# Patient Record
Sex: Female | Born: 1990 | Race: White | Hispanic: No | Marital: Married | Smoking: Never smoker
Health system: Southern US, Community
[De-identification: ages and names within clinical notes are randomized; demographics above are authoritative.]

## PROBLEM LIST (undated history)

## (undated) ENCOUNTER — Inpatient Hospital Stay (HOSPITAL_COMMUNITY): Payer: Self-pay

## (undated) DIAGNOSIS — F32A Depression, unspecified: Secondary | ICD-10-CM

## (undated) DIAGNOSIS — Z87442 Personal history of urinary calculi: Secondary | ICD-10-CM

## (undated) DIAGNOSIS — Z9889 Other specified postprocedural states: Secondary | ICD-10-CM

## (undated) DIAGNOSIS — N809 Endometriosis, unspecified: Secondary | ICD-10-CM

## (undated) DIAGNOSIS — F419 Anxiety disorder, unspecified: Secondary | ICD-10-CM

## (undated) DIAGNOSIS — F3130 Bipolar disorder, current episode depressed, mild or moderate severity, unspecified: Secondary | ICD-10-CM

## (undated) DIAGNOSIS — M940 Chondrocostal junction syndrome [Tietze]: Secondary | ICD-10-CM

## (undated) DIAGNOSIS — F329 Major depressive disorder, single episode, unspecified: Secondary | ICD-10-CM

## (undated) DIAGNOSIS — F319 Bipolar disorder, unspecified: Secondary | ICD-10-CM

## (undated) DIAGNOSIS — R112 Nausea with vomiting, unspecified: Secondary | ICD-10-CM

## (undated) HISTORY — PX: APPENDECTOMY: SHX54

## (undated) HISTORY — DX: Bipolar disorder, current episode depressed, mild or moderate severity, unspecified: F31.30

## (undated) HISTORY — PX: CHOLECYSTECTOMY: SHX55

---
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
## 2012-04-22 LAB — OB RESULTS CONSOLE HIV ANTIBODY (ROUTINE TESTING): HIV: NONREACTIVE

## 2012-04-22 LAB — OB RESULTS CONSOLE RPR: RPR: NONREACTIVE

## 2012-05-11 NOTE — L&D Delivery Note (Addendum)
Delivery Note At 12:36 PM a viable and healthy female was delivered via Vaginal, Spontaneous Delivery (Presentation: OP manually rotated to Right Occiput Anterior).  APGAR: 8, 9; weight 7 lbs 4 oz.   Placenta status: Intact, Spontaneous.  Cord: 3 vessels.  Anesthesia: Epidural, Pudendal Episiotomy: None Lacerations: 1st degree;Vaginal at introitus Suture Repair: 3.0 chromic Est. Blood Loss (mL): 300  Mom to postpartum.  Baby to nursery-stable.  Yarelli Decelles D 10/18/2012, 1:38 PM

## 2012-06-10 ENCOUNTER — Encounter (HOSPITAL_COMMUNITY): Payer: Self-pay

## 2012-06-10 ENCOUNTER — Inpatient Hospital Stay (HOSPITAL_COMMUNITY): Payer: BC Managed Care – PPO

## 2012-06-10 ENCOUNTER — Inpatient Hospital Stay (HOSPITAL_COMMUNITY)
Admission: AD | Admit: 2012-06-10 | Discharge: 2012-06-10 | Disposition: A | Discharge status: Home / Self Care | Payer: BC Managed Care – PPO | Source: Ambulatory Visit | Attending: Obstetrics and Gynecology | Admitting: Obstetrics and Gynecology

## 2012-06-10 DIAGNOSIS — O26839 Pregnancy related renal disease, unspecified trimester: Secondary | ICD-10-CM

## 2012-06-10 DIAGNOSIS — M545 Low back pain, unspecified: Secondary | ICD-10-CM | POA: Insufficient documentation

## 2012-06-10 DIAGNOSIS — R1032 Left lower quadrant pain: Secondary | ICD-10-CM | POA: Insufficient documentation

## 2012-06-10 DIAGNOSIS — N200 Calculus of kidney: Secondary | ICD-10-CM | POA: Insufficient documentation

## 2012-06-10 HISTORY — DX: Personal history of urinary calculi: Z87.442

## 2012-06-10 MED ORDER — OXYCODONE-ACETAMINOPHEN 5-325 MG PO TABS
2.0000 | ORAL_TABLET | ORAL | Status: AC
  Administered 2012-06-10: 2 via ORAL
  Filled 2012-06-10: qty 2

## 2012-06-10 MED ORDER — TAMSULOSIN HCL 0.4 MG PO CAPS: 0.4000 mg | ORAL_CAPSULE | Freq: Every day | ORAL | Status: DC

## 2012-06-10 MED ORDER — PROMETHAZINE HCL 25 MG PO TABS
25.0000 mg | ORAL_TABLET | ORAL | Status: AC
  Administered 2012-06-10: 25 mg via ORAL
  Filled 2012-06-10: qty 1

## 2012-06-10 MED ORDER — PROMETHAZINE HCL 25 MG PO TABS: 12.5000 mg | ORAL_TABLET | Freq: Four times a day (QID) | ORAL | Status: DC | PRN

## 2012-06-10 MED ORDER — OXYCODONE-ACETAMINOPHEN 5-325 MG PO TABS: 1.0000 | ORAL_TABLET | ORAL | Status: DC | PRN

## 2012-06-10 NOTE — MAU Note (Signed)
Patient states she has a history of kidney stones. Was seen in the office and sent to MAU for evaluation. States she started having right side abdominal and right flank pain this am that feels like a stone. No blood in urine.

## 2012-06-10 NOTE — MAU Provider Note (Signed)
Chief Complaint:  Flank Pain   First Provider Initiated Contact with Patient 06/10/12 1714      HPI: [name] is a 22 y.o. G2P1001 at [redacted]w[redacted]d who presents to maternity admissions reporting LLQ pain, left flank pain, and left lower back pain described as severe, sharp, and constant starting at 6 am today.  She reports nausea with the pain but no vomiting. She denies cramping, vaginal bleeding, vaginal itching/burning, urinary symptoms, h/a, dizziness, or fever/chills.     Past Medical History: Past Medical History  Diagnosis Date  . No pertinent past medical history   . Anxiety   . Depression   . Angina     "arthritis in her chest"  . Arthritis   . History of kidney stones     Past obstetric history: OB History    Grav Para Term Preterm Abortions TAB SAB Ect Mult Living   2 1 1  0 0 0 0 0 0 1     # Outc Date GA Lbr Len/2nd Wgt Sex Del Anes PTL Lv   1 TRM 7/12 [redacted]w[redacted]d 09:28 / 01:19 6lb13oz(3.09kg) M SVD EPI  Yes   Comments: None   2 CUR               Past Surgical History: Past Surgical History  Procedure Date  . Appendectomy   . Cholecystectomy     Family History: Family History  Problem Relation Age of Onset  . Alcohol abuse Father   . Depression Father   . Mental illness Father   . Drug abuse Father   . Depression Mother   . Birth defects Sister   . Depression Maternal Grandmother   . Heart disease Maternal Grandmother   . Hypertension Maternal Grandmother   . Depression Maternal Grandfather   . Heart disease Maternal Grandfather   . Hypertension Maternal Grandfather   . Depression Paternal Grandmother   . Heart disease Paternal Grandmother   . Hypertension Paternal Grandmother   . Depression Paternal Grandfather   . Heart disease Paternal Grandfather   . Cancer Paternal Grandfather   . Alcohol abuse Paternal Grandfather   . Hypertension Paternal Grandfather     Social History: History  Substance Use Topics  . Smoking status: Never Smoker   .  Smokeless tobacco: Never Used  . Alcohol Use: No    Allergies: No Known Allergies  Meds:  Prescriptions prior to admission  Medication Sig Dispense Refill  . FLUoxetine (PROZAC) 20 MG tablet Take 20 mg by mouth daily.      . Pediatric Multivit-Minerals-C (FLINTSTONES GUMMIES PLUS PO) Take 2 tablets by mouth every morning.      . ferrous sulfate 325 (65 FE) MG tablet Take 1 tablet (325 mg total) by mouth 2 (two) times daily with a meal.  60 tablet  10    ROS: Pertinent findings in history of present illness.  Physical Exam  Blood pressure 130/69, pulse 75, temperature 98.2 F (36.8 C), temperature source Oral, resp. rate 18, height 5' 4.5" (1.638 m), weight 55.248 kg (121 lb 12.8 oz), SpO2 100.00%, unknown if currently breastfeeding. GENERAL: Well-developed, well-nourished female demonstrating moderate distress r/t pain  HEENT: normocephalic HEART: normal rate RESP: normal effort ABDOMEN: Soft, tender in left lower quadrant and left flank, no rebound tenderness or guarding, gravid appropriate for gestational age BACK: CVA tenderness negative, generalized tenderness in left lower back EXTREMITIES: Nontender, no edema NEURO: alert and oriented SPECULUM EXAM: Deferred    FHT: 156 by doppler  Imaging:  US Renal  06/10/2012  *RADIOLOGY REPORT*  Clinical Data: Right flank/back pain, history of kidney stones, [redacted] weeks pregnant.  RENAL/URINARY TRACT ULTRASOUND COMPLETE  Comparison:  None.  Findings:  Right Kidney:  Measures 11.2 cm.  Multiple small stones, the largest measuring 4 mm in the interpolar region.  Moderate hydronephrosis.  Left Kidney:  Measures 10.8 cm.  No mass or hydronephrosis.  Bladder:  Within normal limits.  Bilateral bladder jets were not visualized.  IMPRESSION: Multiple small right renal calculi measuring up to 4 mm.  Moderate right hydronephrosis.  While some of this appearance may be physiologic (related to extrinsic compression), the appearance is worrisome for a  non-visualized obstructing ureteral calculus.   Original Report Authenticated By: Charline Bills, M.D.     Assessment: 1. Pregnancy with nephrolithiasis     Plan: Called Dr Claiborne Billings to discuss assessment and findings Percocet 2 tabs in MAU with Phenergan for nausea Discharge home Percocet 5/235 x20 tabs Phenergan 12.5 mg PO Q6 hours PRN Flomax 0.4 mg daily x7 days Materials given to strain urine F/U in office on Monday Return to MAU as needed    Medication List     As of 06/10/2012  5:30 PM    ASK your doctor about these medications         ferrous sulfate 325 (65 FE) MG tablet   Take 1 tablet (325 mg total) by mouth 2 (two) times daily with a meal.      FLINTSTONES GUMMIES PLUS PO   Take 2 tablets by mouth every morning.      FLUoxetine 20 MG tablet   Commonly known as: PROZAC   Take 20 mg by mouth daily.        Sharen Counter Certified Nurse-Midwife 06/10/2012 5:30 PM

## 2012-07-21 ENCOUNTER — Inpatient Hospital Stay (HOSPITAL_COMMUNITY)
Admission: AD | Admit: 2012-07-21 | Discharge: 2012-07-22 | Disposition: A | Discharge status: Home / Self Care | Payer: BC Managed Care – PPO | Source: Ambulatory Visit | Attending: Obstetrics and Gynecology | Admitting: Obstetrics and Gynecology

## 2012-07-21 ENCOUNTER — Encounter (HOSPITAL_COMMUNITY): Payer: Self-pay | Admitting: *Deleted

## 2012-07-21 DIAGNOSIS — R51 Headache: Secondary | ICD-10-CM | POA: Insufficient documentation

## 2012-07-21 DIAGNOSIS — O212 Late vomiting of pregnancy: Secondary | ICD-10-CM | POA: Insufficient documentation

## 2012-07-21 DIAGNOSIS — R6889 Other general symptoms and signs: Secondary | ICD-10-CM

## 2012-07-21 DIAGNOSIS — R079 Chest pain, unspecified: Secondary | ICD-10-CM | POA: Insufficient documentation

## 2012-07-21 DIAGNOSIS — R05 Cough: Secondary | ICD-10-CM | POA: Insufficient documentation

## 2012-07-21 DIAGNOSIS — R059 Cough, unspecified: Secondary | ICD-10-CM | POA: Insufficient documentation

## 2012-07-21 DIAGNOSIS — J111 Influenza due to unidentified influenza virus with other respiratory manifestations: Secondary | ICD-10-CM | POA: Insufficient documentation

## 2012-07-21 DIAGNOSIS — O99891 Other specified diseases and conditions complicating pregnancy: Secondary | ICD-10-CM | POA: Insufficient documentation

## 2012-07-21 HISTORY — DX: Chondrocostal junction syndrome (tietze): M94.0

## 2012-07-21 LAB — CBC
Hemoglobin: 9.5 g/dL — ABNORMAL LOW (ref 12.0–15.0)
MCH: 27.8 pg (ref 26.0–34.0)
MCHC: 33.9 g/dL (ref 30.0–36.0)
MCV: 81.9 fL (ref 78.0–100.0)
Platelets: 180 10*3/uL (ref 150–400)
RBC: 3.42 MIL/uL — ABNORMAL LOW (ref 3.87–5.11)

## 2012-07-21 LAB — URINALYSIS, ROUTINE W REFLEX MICROSCOPIC
Bilirubin Urine: NEGATIVE
Hgb urine dipstick: NEGATIVE
Ketones, ur: >80 mg/dL — AB
Specific Gravity, Urine: 1.01 (ref 1.005–1.030)
Urobilinogen, UA: 0.2 mg/dL (ref 0.0–1.0)

## 2012-07-21 LAB — URINE MICROSCOPIC-ADD ON

## 2012-07-21 MED ORDER — DIPHENHYDRAMINE HCL 50 MG/ML IJ SOLN
25.0000 mg | Freq: Once | INTRAMUSCULAR | Status: AC
  Administered 2012-07-21: 25 mg via INTRAVENOUS
  Filled 2012-07-21: qty 1

## 2012-07-21 MED ORDER — PROMETHAZINE HCL 25 MG/ML IJ SOLN
25.0000 mg | Freq: Once | INTRAVENOUS | Status: AC
  Administered 2012-07-21: 25 mg via INTRAVENOUS
  Filled 2012-07-21: qty 1

## 2012-07-21 MED ORDER — ACETAMINOPHEN 325 MG PO TABS
650.0000 mg | ORAL_TABLET | Freq: Once | ORAL | Status: AC
  Administered 2012-07-21: 650 mg via ORAL
  Filled 2012-07-21: qty 2

## 2012-07-21 MED ORDER — OSELTAMIVIR PHOSPHATE 75 MG PO CAPS: 75.0000 mg | ORAL_CAPSULE | Freq: Two times a day (BID) | ORAL | Status: DC

## 2012-07-21 MED ORDER — LACTATED RINGERS IV BOLUS (SEPSIS)
1000.0000 mL | Freq: Once | INTRAVENOUS | Status: AC
  Administered 2012-07-21: 1000 mL via INTRAVENOUS

## 2012-07-21 MED ORDER — PROMETHAZINE HCL 25 MG PO TABS: 25.0000 mg | ORAL_TABLET | Freq: Four times a day (QID) | ORAL | Status: DC | PRN

## 2012-07-21 MED ORDER — HYDROXYZINE HCL 25 MG PO TABS
25.0000 mg | ORAL_TABLET | Freq: Once | ORAL | Status: AC
  Administered 2012-07-21: 25 mg via ORAL
  Filled 2012-07-21: qty 1

## 2012-07-21 NOTE — MAU Note (Signed)
Pt states she vomitted twice during the night, has had a lot of nausea today, has eaten a pack of crackers in 24 hours.  "My whole body hurts."  Denies diarrhea.  Also denies bleeding or LOF.

## 2012-07-21 NOTE — MAU Note (Signed)
Fetal monitors off per Lilyan Punt NP

## 2012-07-21 NOTE — MAU Provider Note (Signed)
History     CSN: [id number]  Arrival date and time: 07/21/12 1659   First Provider Initiated Contact with Patient 07/21/12 1811      Chief Complaint  Patient presents with  . Nausea   HPI [name] 22 y.o. [redacted]w[redacted]d  Comes to MAU with body aches, headache, head congestion, cough and pain in her chest.  Began feeling bad on Tuesday night.  Much worse since last night.  Denies any asthma, shortness of breath and smoking.  Was not feeling the baby move earlier today but has felt the baby move since arrival to MAU.  Currently is having nausea and feeling very tired.  OB History   Grav Para Term Preterm Abortions TAB SAB Ect Mult Living   2 1 1  0 0 0 0 0 0 1      Past Medical History  Diagnosis Date  . No pertinent past medical history   . Anxiety   . Depression   . Angina     "arthritis in her chest"  . History of kidney stones   . Costochondritis     Past Surgical History  Procedure Laterality Date  . Appendectomy    . Cholecystectomy      Family History  Problem Relation Age of Onset  . Alcohol abuse Father   . Depression Father   . Mental illness Father   . Drug abuse Father   . Depression Mother   . Birth defects Sister   . Depression Maternal Grandmother   . Heart disease Maternal Grandmother   . Hypertension Maternal Grandmother   . Depression Maternal Grandfather   . Heart disease Maternal Grandfather   . Hypertension Maternal Grandfather   . Depression Paternal Grandmother   . Heart disease Paternal Grandmother   . Hypertension Paternal Grandmother   . Depression Paternal Grandfather   . Heart disease Paternal Grandfather   . Cancer Paternal Grandfather   . Alcohol abuse Paternal Grandfather   . Hypertension Paternal Grandfather     History  Substance Use Topics  . Smoking status: Never Smoker   . Smokeless tobacco: Never Used  . Alcohol Use: No    Allergies: No Known Allergies  Prescriptions prior to admission  Medication Sig Dispense  Refill  . acetaminophen (TYLENOL) 325 MG tablet Take 650 mg by mouth every 6 (six) hours as needed for pain (for headache).      Marland Kitchen FLUoxetine (PROZAC) 20 MG tablet Take 20 mg by mouth daily.      . Pediatric Multivit-Minerals-C (FLINTSTONES GUMMIES PLUS PO) Take 2 tablets by mouth every morning.        Review of Systems  Constitutional: Negative for chills.       Temp of 99 last night.  HENT: Positive for congestion.   Respiratory: Positive for cough. Negative for shortness of breath.   Cardiovascular:       Pain her her chest  Gastrointestinal: Negative for abdominal pain.  Musculoskeletal: Positive for myalgias.  Neurological: Positive for headaches.   Physical Exam   Blood pressure 112/70, pulse 117, temperature 98.3 F (36.8 C), temperature source Oral, resp. rate 20.  Physical Exam  Nursing note and vitals reviewed. Constitutional: She is oriented to person, place, and time. She appears well-developed and well-nourished.  Lying in bed with eyes closed  HENT:  Head: Normocephalic.  Eyes: EOM are normal.  Neck: Neck supple.  Cardiovascular: Normal rate, regular rhythm and normal heart sounds.  Exam reveals no gallop and no friction  rub.   No murmur heard. Respiratory: Effort normal and breath sounds normal. No respiratory distress. She has no wheezes. She has no rales.  GI: Soft. There is no tenderness. There is no rebound and no guarding.  No contractions seen on monitor FHT  Baseline 140 with good variability.  Musculoskeletal: Normal range of motion.  Neurological: She is alert and oriented to person, place, and time.  Skin: Skin is warm and dry.  Psychiatric: She has a normal mood and affect.    MAU Course  Procedures Results for orders placed during the hospital encounter of 07/21/12 (from the past 24 hour(s))  URINALYSIS, ROUTINE W REFLEX MICROSCOPIC     Status: Abnormal   Collection Time    07/21/12  5:25 PM      Result Value Range   Color, Urine YELLOW   YELLOW   APPearance CLEAR  CLEAR   Specific Gravity, Urine 1.010  1.005 - 1.030   pH 6.5  5.0 - 8.0   Glucose, UA NEGATIVE  NEGATIVE mg/dL   Hgb urine dipstick NEGATIVE  NEGATIVE   Bilirubin Urine NEGATIVE  NEGATIVE   Ketones, ur >80 (*) NEGATIVE mg/dL   Protein, ur NEGATIVE  NEGATIVE mg/dL   Urobilinogen, UA 0.2  0.0 - 1.0 mg/dL   Nitrite NEGATIVE  NEGATIVE   Leukocytes, UA TRACE (*) NEGATIVE  URINE MICROSCOPIC-ADD ON     Status: Abnormal   Collection Time    07/21/12  5:25 PM      Result Value Range   Squamous Epithelial / LPF MANY (*) RARE   WBC, UA 7-10  <3 WBC/hpf   Bacteria, UA FEW (*) RARE   Urine-Other RENAL TUBULUAR EPITHELIAL CELLS PRESENT    CBC     Status: Abnormal   Collection Time    07/21/12  6:22 PM      Result Value Range   WBC 11.5 (*) 4.0 - 10.5 K/uL   RBC 3.42 (*) 3.87 - 5.11 MIL/uL   Hemoglobin 9.5 (*) 12.0 - 15.0 g/dL   HCT 16.1 (*) 09.6 - 04.5 %   MCV 81.9  78.0 - 100.0 fL   MCH 27.8  26.0 - 34.0 pg   MCHC 33.9  30.0 - 36.0 g/dL   RDW 40.9  81.1 - 91.4 %   Platelets 180  150 - 400 K/uL    MDM 1820  Consult with Dr. Dareen Piano and reviewed plan of care. Will give LR 1000cc with Phenergan 25 mg as client is feeling washed out and has not been able to eat today. 2100 Care assumed by S. Chase Picket, NP Pt developed tardive dyskinesia after phenergan- Benadryl 25mg  IV and LR 1000cc bolus- pt anxious and still having dyskinesia- Vistaril 25mg  PO given Dr. Malva Limes consulted  Assessment and Plan  URI vs Flu Nausea in pregnancy  Plan IVF given RX tamiflu as symptoms may be due to flu RX Phenergan 25 mg One po q 6 hours as needed for nausea. Drink at least 8 8-oz glasses of water every day. Take Tylenol 325 mg 2 tablets by mouth every 4 hours if needed for pain. May take OTC Sudafed by the package directions for nasal congestion if desired.  BURLESON,TERRI 07/21/2012, 6:19 PM

## 2012-07-21 NOTE — MAU Note (Signed)
Started last night with nausea and vomiting.  Has not thrown up today, has not eaten anything. Just doesn't feel good.  Denies diarrhea.

## 2012-07-22 NOTE — MAU Note (Signed)
Pt experiencing shakiness. Wende Bushy NP notified.

## 2012-07-22 NOTE — MAU Note (Signed)
Pt given benadryl per Wende Bushy NP

## 2012-07-22 NOTE — MAU Note (Signed)
Pt given Vistaril per Wende Bushy NP. Orders to d/c pt home.

## 2012-07-22 NOTE — MAU Note (Signed)
Pt given d/c instructions. Pt will follow up with Dr Dareen Piano in the morning.

## 2012-07-23 LAB — URINE CULTURE
Colony Count: NO GROWTH
Culture: NO GROWTH

## 2012-09-27 LAB — OB RESULTS CONSOLE GC/CHLAMYDIA: Chlamydia: NEGATIVE

## 2012-09-27 LAB — OB RESULTS CONSOLE GBS: GBS: NEGATIVE

## 2012-10-17 ENCOUNTER — Encounter (HOSPITAL_COMMUNITY): Payer: Self-pay | Admitting: *Deleted

## 2012-10-17 ENCOUNTER — Inpatient Hospital Stay (HOSPITAL_COMMUNITY)
Admission: AD | Admit: 2012-10-17 | Discharge: 2012-10-20 | DRG: 373 | Disposition: A | Discharge status: Home / Self Care | Payer: BC Managed Care – PPO | Source: Ambulatory Visit | Attending: Obstetrics and Gynecology | Admitting: Obstetrics and Gynecology

## 2012-10-17 NOTE — MAU Note (Signed)
PT SAYS SHE STARTED   HURTING BAD AT 945PM.    VE IN OFFICE LAST WEEK -   4 CM.    DENIES HSV AND MRSA.  GBS- NEG.

## 2012-10-18 ENCOUNTER — Encounter (HOSPITAL_COMMUNITY): Payer: Self-pay

## 2012-10-18 ENCOUNTER — Encounter (HOSPITAL_COMMUNITY): Payer: Self-pay | Admitting: Anesthesiology

## 2012-10-18 ENCOUNTER — Inpatient Hospital Stay (HOSPITAL_COMMUNITY): Payer: BC Managed Care – PPO | Admitting: Anesthesiology

## 2012-10-18 LAB — CBC
HCT: 31.3 % — ABNORMAL LOW (ref 36.0–46.0)
Hemoglobin: 10 g/dL — ABNORMAL LOW (ref 12.0–15.0)
MCH: 21.6 pg — ABNORMAL LOW (ref 26.0–34.0)
MCHC: 31.9 g/dL (ref 30.0–36.0)
MCV: 67.7 fL — ABNORMAL LOW (ref 78.0–100.0)
Platelets: 257 10*3/uL (ref 150–400)
RBC: 4.62 MIL/uL (ref 3.87–5.11)
RDW: 14.8 % (ref 11.5–15.5)
WBC: 14.5 10*3/uL — ABNORMAL HIGH (ref 4.0–10.5)

## 2012-10-18 LAB — RPR: RPR Ser Ql: NONREACTIVE

## 2012-10-18 MED ORDER — FENTANYL 2.5 MCG/ML BUPIVACAINE 1/10 % EPIDURAL INFUSION (WH - ANES)
14.0000 mL/h | INTRAMUSCULAR | Status: DC | PRN
  Administered 2012-10-18: 14 mL/h via EPIDURAL
  Filled 2012-10-18: qty 125

## 2012-10-18 MED ORDER — PRENATAL MULTIVITAMIN CH
1.0000 | ORAL_TABLET | Freq: Every day | ORAL | Status: DC
  Administered 2012-10-19 – 2012-10-20 (×2): 1 via ORAL
  Filled 2012-10-18 (×2): qty 1

## 2012-10-18 MED ORDER — TETANUS-DIPHTH-ACELL PERTUSSIS 5-2.5-18.5 LF-MCG/0.5 IM SUSP
0.5000 mL | Freq: Once | INTRAMUSCULAR | Status: AC
  Administered 2012-10-18: 0.5 mL via INTRAMUSCULAR

## 2012-10-18 MED ORDER — EPHEDRINE 5 MG/ML INJ
10.0000 mg | INTRAVENOUS | Status: DC | PRN
  Filled 2012-10-18: qty 2
  Filled 2012-10-18: qty 4

## 2012-10-18 MED ORDER — OXYTOCIN 40 UNITS IN LACTATED RINGERS INFUSION - SIMPLE MED
62.5000 mL/h | INTRAVENOUS | Status: DC
  Filled 2012-10-18: qty 1000

## 2012-10-18 MED ORDER — DIBUCAINE 1 % RE OINT: 1.0000 "application " | TOPICAL_OINTMENT | RECTAL | Status: DC | PRN

## 2012-10-18 MED ORDER — ZOLPIDEM TARTRATE 5 MG PO TABS: 5.0000 mg | ORAL_TABLET | Freq: Every evening | ORAL | Status: DC | PRN

## 2012-10-18 MED ORDER — DIPHENHYDRAMINE HCL 25 MG PO CAPS: 25.0000 mg | ORAL_CAPSULE | Freq: Four times a day (QID) | ORAL | Status: DC | PRN

## 2012-10-18 MED ORDER — LIDOCAINE HCL (PF) 1 % IJ SOLN
30.0000 mL | INTRAMUSCULAR | Status: AC | PRN
  Administered 2012-10-18: 30 mL via SUBCUTANEOUS
  Filled 2012-10-18 (×2): qty 30

## 2012-10-18 MED ORDER — ONDANSETRON HCL 4 MG/2ML IJ SOLN: 4.0000 mg | INTRAMUSCULAR | Status: DC | PRN

## 2012-10-18 MED ORDER — PHENYLEPHRINE 40 MCG/ML (10ML) SYRINGE FOR IV PUSH (FOR BLOOD PRESSURE SUPPORT)
80.0000 ug | PREFILLED_SYRINGE | INTRAVENOUS | Status: DC | PRN
  Filled 2012-10-18: qty 2
  Filled 2012-10-18: qty 5

## 2012-10-18 MED ORDER — OXYCODONE-ACETAMINOPHEN 5-325 MG PO TABS: 1.0000 | ORAL_TABLET | ORAL | Status: DC | PRN

## 2012-10-18 MED ORDER — SODIUM BICARBONATE 8.4 % IV SOLN
INTRAVENOUS | Status: DC | PRN
  Administered 2012-10-18: 5 mL via EPIDURAL

## 2012-10-18 MED ORDER — ACETAMINOPHEN 325 MG PO TABS: 650.0000 mg | ORAL_TABLET | ORAL | Status: DC | PRN

## 2012-10-18 MED ORDER — CITRIC ACID-SODIUM CITRATE 334-500 MG/5ML PO SOLN: 30.0000 mL | ORAL | Status: DC | PRN

## 2012-10-18 MED ORDER — IBUPROFEN 600 MG PO TABS
600.0000 mg | ORAL_TABLET | Freq: Four times a day (QID) | ORAL | Status: DC
Start: 2012-10-18 — End: 2012-10-20
  Administered 2012-10-18 – 2012-10-20 (×8): 600 mg via ORAL
  Filled 2012-10-18 (×9): qty 1

## 2012-10-18 MED ORDER — EPHEDRINE 5 MG/ML INJ
10.0000 mg | INTRAVENOUS | Status: DC | PRN
  Filled 2012-10-18: qty 2

## 2012-10-18 MED ORDER — FLEET ENEMA 7-19 GM/118ML RE ENEM: 1.0000 | ENEMA | RECTAL | Status: DC | PRN

## 2012-10-18 MED ORDER — DIPHENHYDRAMINE HCL 50 MG/ML IJ SOLN: 12.5000 mg | INTRAMUSCULAR | Status: DC | PRN

## 2012-10-18 MED ORDER — BENZOCAINE-MENTHOL 20-0.5 % EX AERO: 1.0000 "application " | INHALATION_SPRAY | CUTANEOUS | Status: DC | PRN

## 2012-10-18 MED ORDER — SIMETHICONE 80 MG PO CHEW: 80.0000 mg | CHEWABLE_TABLET | ORAL | Status: DC | PRN

## 2012-10-18 MED ORDER — LACTATED RINGERS IV SOLN
500.0000 mL | INTRAVENOUS | Status: DC | PRN
  Administered 2012-10-18: 300 mL via INTRAVENOUS

## 2012-10-18 MED ORDER — TERBUTALINE SULFATE 1 MG/ML IJ SOLN: 0.2500 mg | Freq: Once | INTRAMUSCULAR | Status: DC | PRN

## 2012-10-18 MED ORDER — OXYCODONE-ACETAMINOPHEN 5-325 MG PO TABS
1.0000 | ORAL_TABLET | ORAL | Status: DC | PRN
  Administered 2012-10-18 – 2012-10-19 (×4): 2 via ORAL
  Administered 2012-10-20: 1 via ORAL
  Filled 2012-10-18: qty 2
  Filled 2012-10-18: qty 1
  Filled 2012-10-18: qty 2
  Filled 2012-10-18: qty 1
  Filled 2012-10-18: qty 2
  Filled 2012-10-18: qty 1

## 2012-10-18 MED ORDER — SENNOSIDES-DOCUSATE SODIUM 8.6-50 MG PO TABS
2.0000 | ORAL_TABLET | Freq: Every day | ORAL | Status: DC
Start: 2012-10-18 — End: 2012-10-20
  Administered 2012-10-18 – 2012-10-19 (×2): 2 via ORAL

## 2012-10-18 MED ORDER — ONDANSETRON HCL 4 MG/2ML IJ SOLN: 4.0000 mg | Freq: Four times a day (QID) | INTRAMUSCULAR | Status: DC | PRN

## 2012-10-18 MED ORDER — PHENYLEPHRINE 40 MCG/ML (10ML) SYRINGE FOR IV PUSH (FOR BLOOD PRESSURE SUPPORT)
80.0000 ug | PREFILLED_SYRINGE | INTRAVENOUS | Status: DC | PRN
  Filled 2012-10-18: qty 2

## 2012-10-18 MED ORDER — IBUPROFEN 600 MG PO TABS
600.0000 mg | ORAL_TABLET | Freq: Four times a day (QID) | ORAL | Status: DC | PRN
  Administered 2012-10-18: 600 mg via ORAL
  Filled 2012-10-18: qty 1

## 2012-10-18 MED ORDER — LANOLIN HYDROUS EX OINT: TOPICAL_OINTMENT | CUTANEOUS | Status: DC | PRN

## 2012-10-18 MED ORDER — ONDANSETRON HCL 4 MG PO TABS: 4.0000 mg | ORAL_TABLET | ORAL | Status: DC | PRN

## 2012-10-18 MED ORDER — OXYTOCIN BOLUS FROM INFUSION
500.0000 mL | INTRAVENOUS | Status: DC
  Administered 2012-10-18: 500 mL via INTRAVENOUS

## 2012-10-18 MED ORDER — OXYTOCIN 40 UNITS IN LACTATED RINGERS INFUSION - SIMPLE MED
1.0000 m[IU]/min | INTRAVENOUS | Status: DC
  Administered 2012-10-18: 2 m[IU]/min via INTRAVENOUS

## 2012-10-18 MED ORDER — LACTATED RINGERS IV SOLN
500.0000 mL | Freq: Once | INTRAVENOUS | Status: AC
  Administered 2012-10-18: 500 mL via INTRAVENOUS

## 2012-10-18 MED ORDER — LACTATED RINGERS IV SOLN
INTRAVENOUS | Status: DC
  Administered 2012-10-18 (×2): via INTRAVENOUS

## 2012-10-18 MED ORDER — WITCH HAZEL-GLYCERIN EX PADS: 1.0000 "application " | MEDICATED_PAD | CUTANEOUS | Status: DC | PRN

## 2012-10-18 NOTE — H&P (Signed)
22 y.o. G2P1001  Estimated Date of Delivery: 10/25/12 admitted at [redacted] weeks gestation in labor.  Prenatal Transfer Tool  Maternal Diabetes: No Genetic Screening: Normal Maternal Ultrasounds/Referrals: Normal Fetal Ultrasounds or other Referrals:  None Maternal Substance Abuse:  No Significant Maternal Medications:  None Significant Maternal Lab Results: None Other Significant Pregnancy Complications:  None  Afebrile, VSS Heart and Lungs: No active disease Abdomen: soft, gravid, EFW AGA. Cervical exam:  6/80  Impression: Labor  Plan:  Admit for delivery

## 2012-10-18 NOTE — Anesthesia Procedure Notes (Signed)

## 2012-10-18 NOTE — Anesthesia Preprocedure Evaluation (Signed)

## 2012-10-19 LAB — CBC
MCHC: 31.1 g/dL (ref 30.0–36.0)
RDW: 14.8 % (ref 11.5–15.5)

## 2012-10-19 NOTE — Anesthesia Postprocedure Evaluation (Signed)
Anesthesia Post Note  Patient: [name]  Procedure(s) Performed: * No procedures listed *  Anesthesia type: Epidural  Patient location: Mother/Baby  Post pain: Pain level controlled  Post assessment: Post-op Vital signs reviewed  Last Vitals:  Filed Vitals:   10/19/12 0604  BP: 121/75  Pulse: 64  Temp: 36.4 C  Resp: 18    Post vital signs: Reviewed  Level of consciousness: awake  Complications: No apparent anesthesia complications

## 2012-10-19 NOTE — Progress Notes (Signed)
PPD#1 Pt and mother are doing well. HGB-8.5 IMP/ stable PLAN/ Routine care.

## 2012-10-20 MED ORDER — ESCITALOPRAM OXALATE 20 MG PO TABS: 20.0000 mg | ORAL_TABLET | Freq: Every day | ORAL | Status: DC

## 2012-10-20 NOTE — Progress Notes (Signed)
Patient is eating, ambulating, voiding.  Pain control is good.  Filed Vitals:   10/18/12 1833 10/19/12 0604 10/19/12 1730 10/20/12 0615  BP: 139/76 121/75 127/70 119/78  Pulse: 79 64 75 69  Temp: 98.5 F (36.9 C) 97.6 F (36.4 C) 97.7 F (36.5 C) 97.5 F (36.4 C)  TempSrc: Oral Oral Axillary Oral  Resp: 18 18 18 19   Height:      Weight:      SpO2:    95%    Fundus firm Perineum without swelling.  Lab Results  Component Value Date   WBC 12.7* 10/19/2012   HGB 8.5* 10/19/2012   HCT 27.3* 10/19/2012   MCV 68.6* 10/19/2012   PLT 181 10/19/2012     O+/RI  A/P Post partum day 2.  Routine care.  Expect d/c today.    Tanay Massiah A

## 2012-10-20 NOTE — Discharge Summary (Signed)
Obstetric Discharge Summary Reason for Admission: onset of labor Prenatal Procedures: none Intrapartum Procedures: spontaneous vaginal delivery Postpartum Procedures: none Complications-Operative and Postpartum: 1st degree perineal laceration Hemoglobin  Date Value Range Status  10/19/2012 8.5* 12.0 - 15.0 g/dL Final     HCT  Date Value Range Status  10/19/2012 27.3* 36.0 - 46.0 % Final    Discharge Diagnoses: Term Pregnancy-delivered  Discharge Information: Date: 10/20/2012 Activity: pelvic rest Diet: routine Medications: Tylenol #3 and Iron Condition: stable Instructions: refer to practice specific booklet Discharge to: home Follow-up Information   Follow up with Mickel Baas, MD In 4 weeks.   Contact information:   [address] [place] [address] [phone]       Newborn Data: Live born female  Birth Weight: 7 lb 3.9 oz (3285 g) APGAR: 8, 9  Home with mother.  Squire Withey A 10/20/2012, 7:53 AM

## 2012-10-20 NOTE — Progress Notes (Signed)

## 2013-12-01 IMAGING — US US RENAL
1 series · 14 of 25 positions shown · non-contrast
Comparison: None.

CLINICAL DATA: Right flank/back pain, history of kidney stones, 20
weeks pregnant.

RENAL/URINARY TRACT ULTRASOUND COMPLETE

[Series 1: us renal · 14 of 54 slices shown]
[im 1/54]
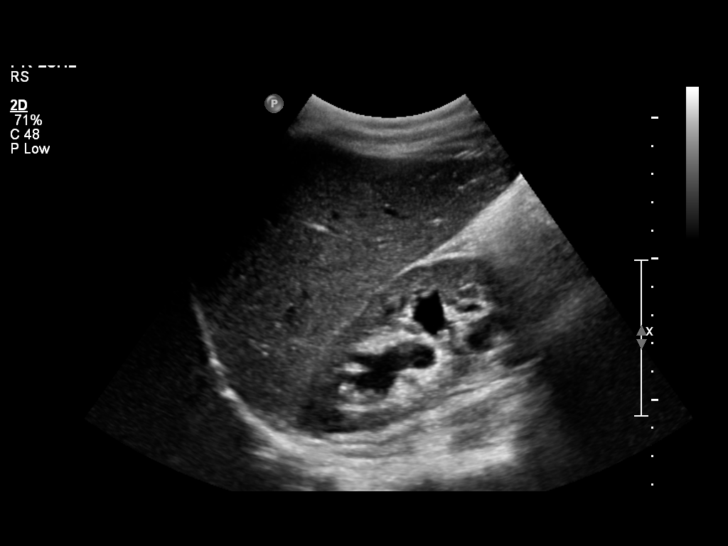
[im 5/54]
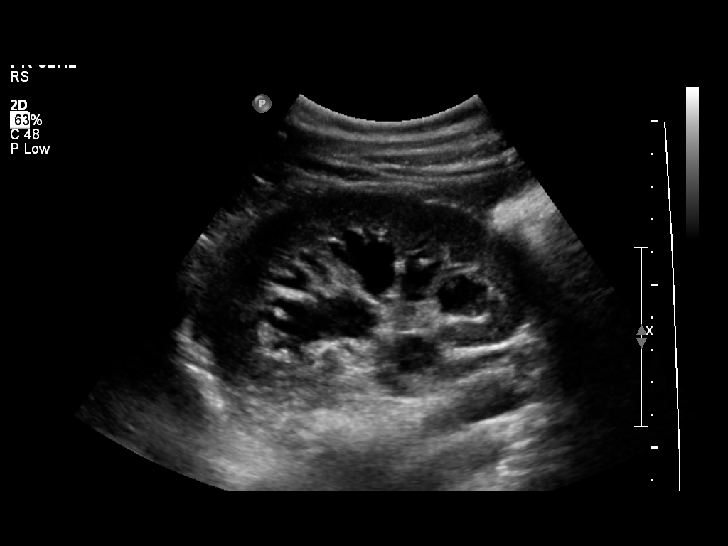
[im 9/54]
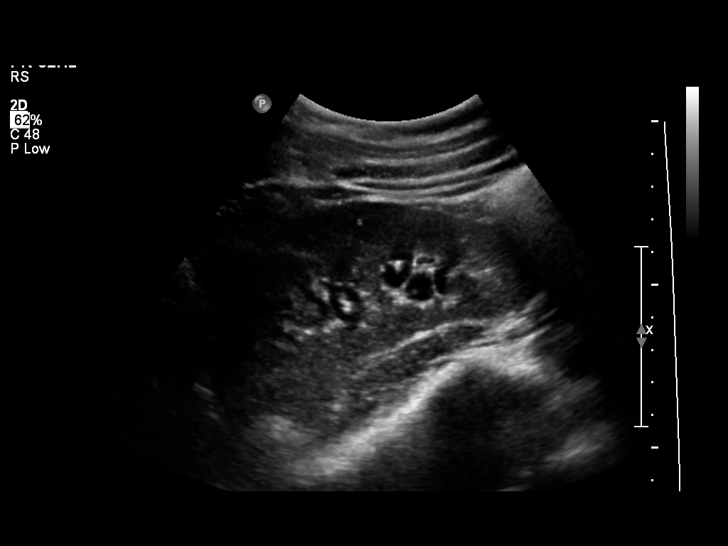
[im 14/54]
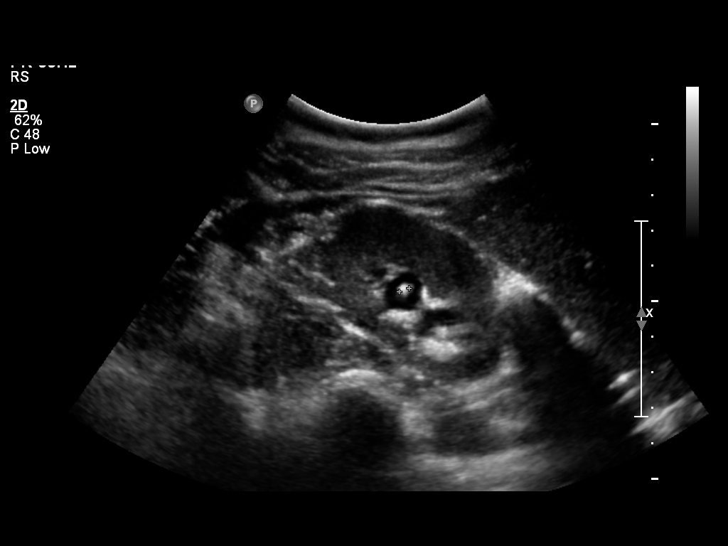
[im 18/54]
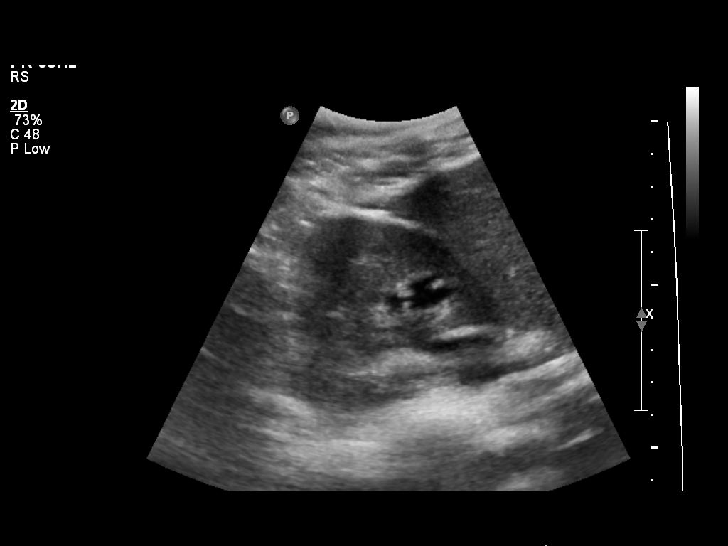
[im 20/54]
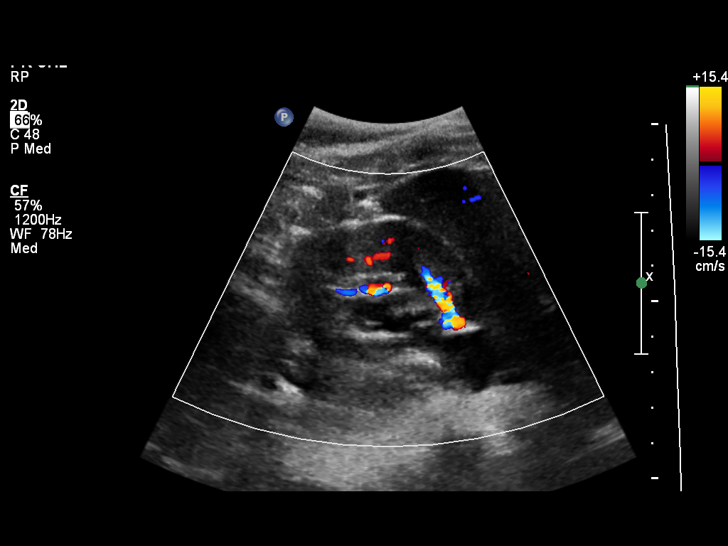
[im 25/54]
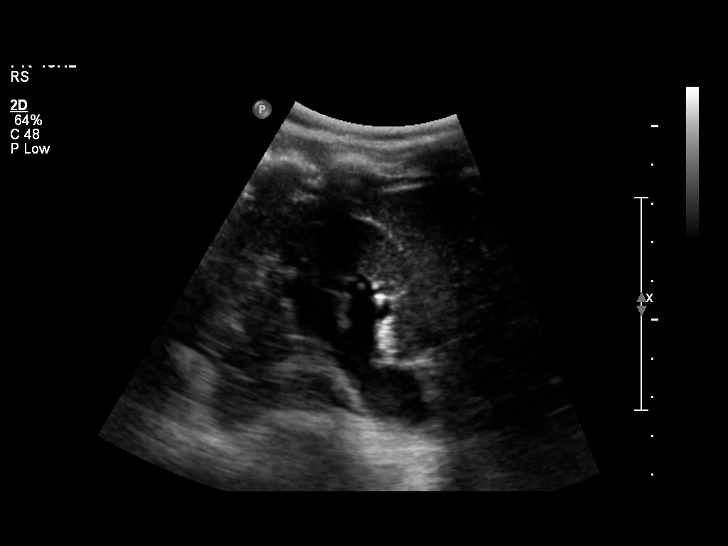
[im 29/54]
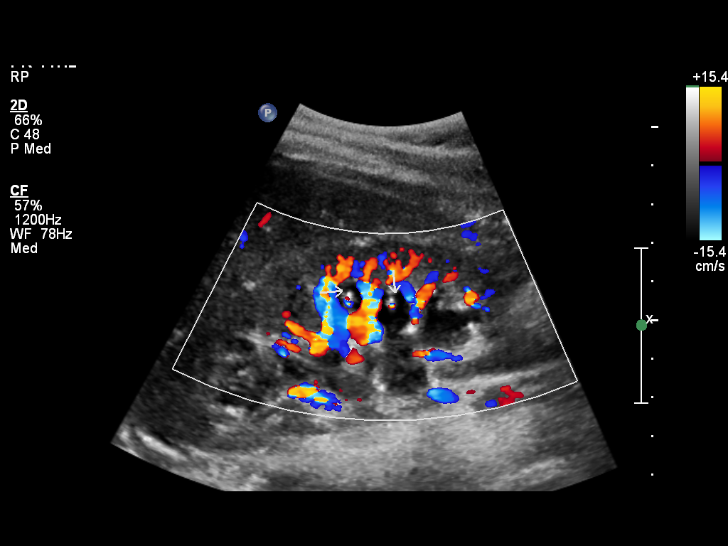
[im 34/54]
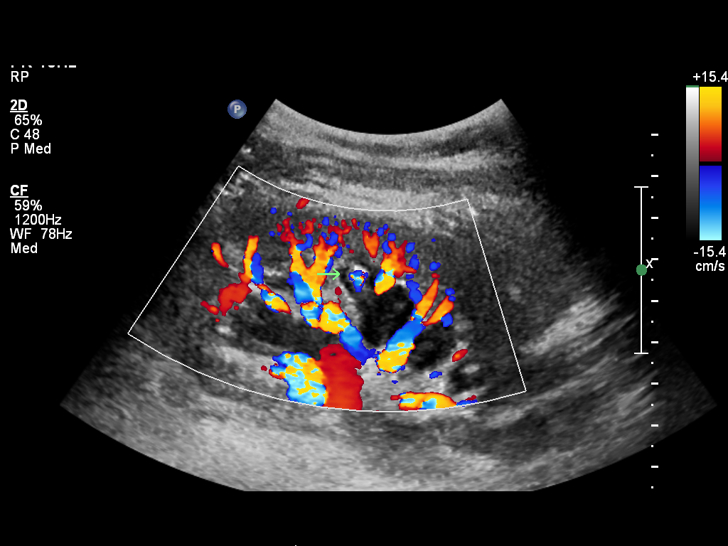
[im 36/54]
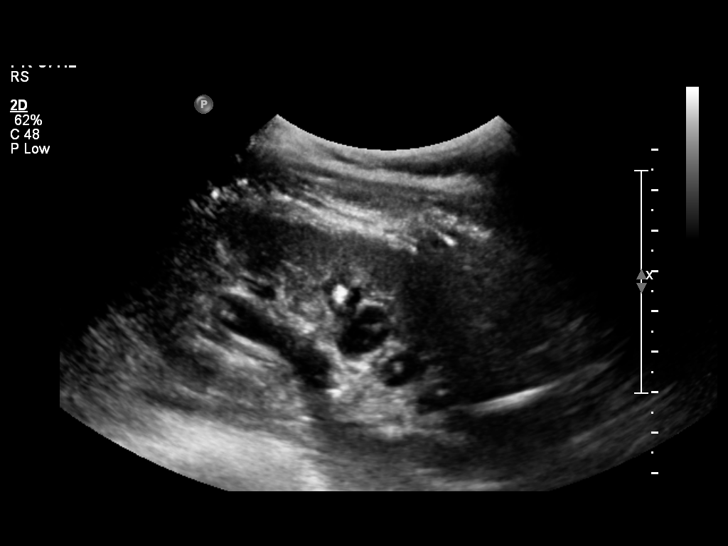
[im 40/54]
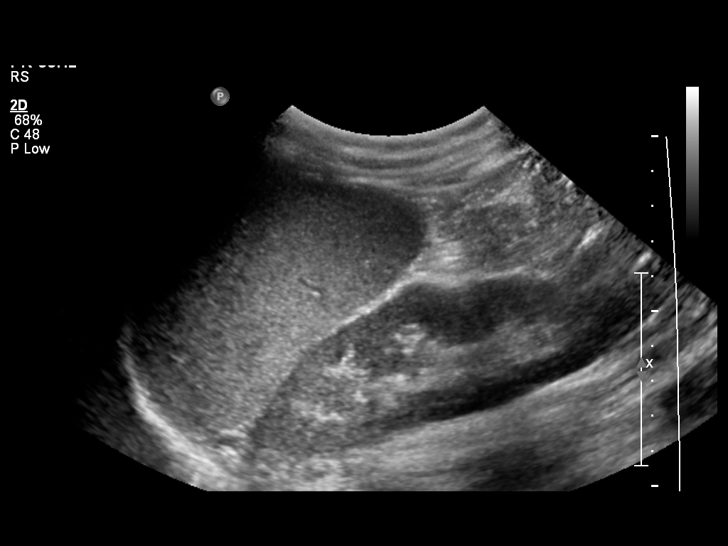
[im 45/54]
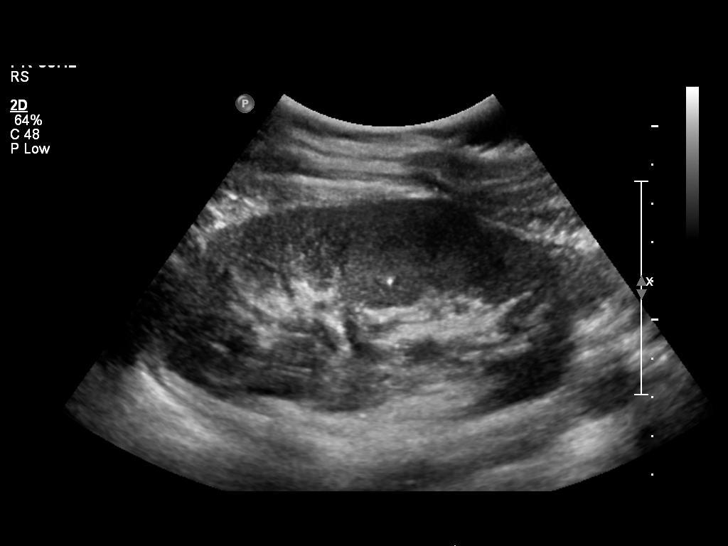
[im 49/54]
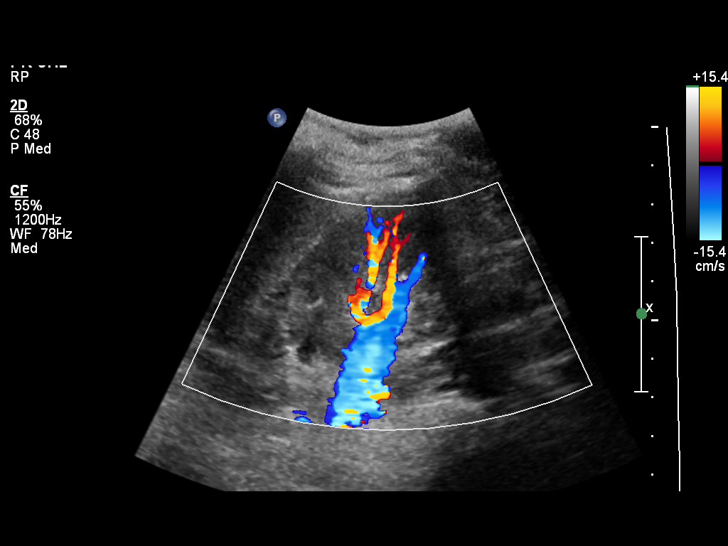
[im 54/54]
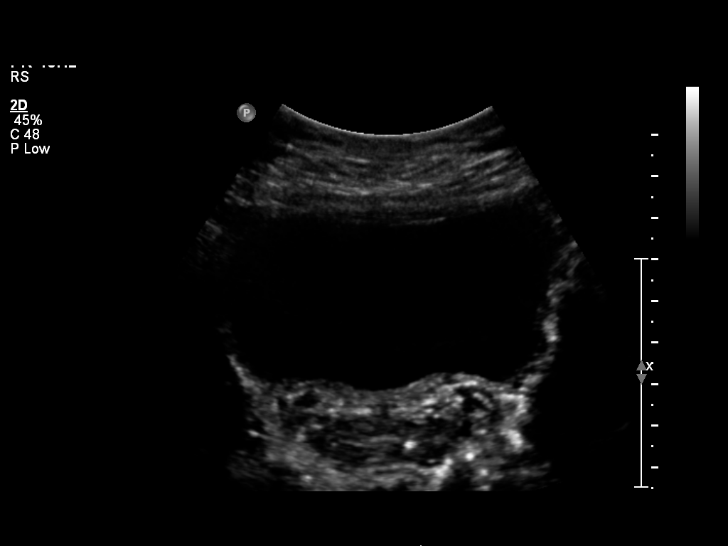

[14 of 25 positions shown; findings below may reference images not displayed]

FINDINGS: Right Kidney:  Measures 11.2 cm.  Multiple small stones, the
largest measuring 4 mm in the interpolar region.  Moderate
hydronephrosis.

Left Kidney:  Measures 10.8 cm.  No mass or hydronephrosis.

Bladder:  Within normal limits.  Bilateral bladder jets were not
visualized.
IMPRESSION: Multiple small right renal calculi measuring up to 4 mm.

Moderate right hydronephrosis.  While some of this appearance may
be physiologic (related to extrinsic compression), the appearance
is worrisome for a non-visualized obstructing ureteral calculus.

## 2014-03-12 ENCOUNTER — Encounter (HOSPITAL_COMMUNITY): Payer: Self-pay

## 2015-03-28 ENCOUNTER — Other Ambulatory Visit (HOSPITAL_COMMUNITY): Payer: Self-pay | Admitting: Obstetrics and Gynecology

## 2015-04-16 ENCOUNTER — Ambulatory Visit (HOSPITAL_COMMUNITY): Payer: Managed Care, Other (non HMO) | Admitting: Anesthesiology

## 2015-04-16 ENCOUNTER — Encounter (HOSPITAL_COMMUNITY): Payer: Self-pay | Admitting: Emergency Medicine

## 2015-04-16 ENCOUNTER — Ambulatory Visit (HOSPITAL_COMMUNITY)
Admission: RE | Admit: 2015-04-16 | Discharge: 2015-04-16 | Disposition: A | Discharge status: Home / Self Care | Payer: Managed Care, Other (non HMO) | Source: Ambulatory Visit | Attending: Obstetrics and Gynecology | Admitting: Obstetrics and Gynecology

## 2015-04-16 ENCOUNTER — Encounter (HOSPITAL_COMMUNITY)
Admission: RE | Disposition: A | Discharge status: Home / Self Care | Payer: Self-pay | Source: Ambulatory Visit | Attending: Obstetrics and Gynecology

## 2015-04-16 DIAGNOSIS — N941 Unspecified dyspareunia: Secondary | ICD-10-CM | POA: Insufficient documentation

## 2015-04-16 DIAGNOSIS — R102 Pelvic and perineal pain: Secondary | ICD-10-CM | POA: Insufficient documentation

## 2015-04-16 DIAGNOSIS — G8929 Other chronic pain: Secondary | ICD-10-CM | POA: Diagnosis not present

## 2015-04-16 HISTORY — DX: Nausea with vomiting, unspecified: R11.2

## 2015-04-16 HISTORY — DX: Nausea with vomiting, unspecified: Z98.890

## 2015-04-16 HISTORY — DX: Other specified postprocedural states: R11.2

## 2015-04-16 HISTORY — PX: LAPAROSCOPY: SHX197

## 2015-04-16 LAB — PREGNANCY, URINE: Preg Test, Ur: NEGATIVE

## 2015-04-16 LAB — CBC
HCT: 43.4 % (ref 36.0–46.0)
Hemoglobin: 15.1 g/dL — ABNORMAL HIGH (ref 12.0–15.0)
MCH: 28.5 pg (ref 26.0–34.0)
MCHC: 34.8 g/dL (ref 30.0–36.0)
MCV: 82 fL (ref 78.0–100.0)
PLATELETS: 326 10*3/uL (ref 150–400)
RBC: 5.29 MIL/uL — ABNORMAL HIGH (ref 3.87–5.11)
RDW: 12.9 % (ref 11.5–15.5)
WBC: 7.6 10*3/uL (ref 4.0–10.5)

## 2015-04-16 SURGERY — LAPAROSCOPY OPERATIVE
Anesthesia: General | Site: Abdomen

## 2015-04-16 MED ORDER — FENTANYL CITRATE (PF) 100 MCG/2ML IJ SOLN
INTRAMUSCULAR | Status: DC | PRN
  Administered 2015-04-16: 50 ug via INTRAVENOUS
  Administered 2015-04-16: 100 ug via INTRAVENOUS
  Administered 2015-04-16 (×2): 25 ug via INTRAVENOUS
  Administered 2015-04-16: 50 ug via INTRAVENOUS

## 2015-04-16 MED ORDER — SCOPOLAMINE 1 MG/3DAYS TD PT72
1.0000 | MEDICATED_PATCH | Freq: Once | TRANSDERMAL | Status: DC
Start: 2015-04-16 — End: 2015-04-16
  Administered 2015-04-16: 1.5 mg via TRANSDERMAL

## 2015-04-16 MED ORDER — PROPOFOL 10 MG/ML IV BOLUS
INTRAVENOUS | Status: DC | PRN
  Administered 2015-04-16: 180 mg via INTRAVENOUS

## 2015-04-16 MED ORDER — CEFAZOLIN SODIUM-DEXTROSE 2-3 GM-% IV SOLR
2.0000 g | INTRAVENOUS | Status: AC
Start: 2015-04-16 — End: 2015-04-16
  Administered 2015-04-16: 2 g via INTRAVENOUS

## 2015-04-16 MED ORDER — GLYCOPYRROLATE 0.2 MG/ML IJ SOLN
INTRAMUSCULAR | Status: DC | PRN
  Administered 2015-04-16: 0.6 mg via INTRAVENOUS

## 2015-04-16 MED ORDER — DEXAMETHASONE SODIUM PHOSPHATE 10 MG/ML IJ SOLN
INTRAMUSCULAR | Status: DC | PRN
  Administered 2015-04-16: 4 mg via INTRAVENOUS

## 2015-04-16 MED ORDER — LACTATED RINGERS IV SOLN
INTRAVENOUS | Status: DC
  Administered 2015-04-16 (×3): via INTRAVENOUS

## 2015-04-16 MED ORDER — FENTANYL CITRATE (PF) 100 MCG/2ML IJ SOLN: 25.0000 ug | INTRAMUSCULAR | Status: DC | PRN

## 2015-04-16 MED ORDER — ONDANSETRON HCL 4 MG/2ML IJ SOLN: 4.0000 mg | Freq: Once | INTRAMUSCULAR | Status: DC | PRN

## 2015-04-16 MED ORDER — OXYCODONE-ACETAMINOPHEN 10-325 MG PO TABS: 1.0000 | ORAL_TABLET | ORAL | Status: DC | PRN

## 2015-04-16 MED ORDER — MIDAZOLAM HCL 2 MG/2ML IJ SOLN
INTRAMUSCULAR | Status: DC | PRN
  Administered 2015-04-16: 2 mg via INTRAVENOUS

## 2015-04-16 MED ORDER — KETOROLAC TROMETHAMINE 30 MG/ML IJ SOLN
INTRAMUSCULAR | Status: DC | PRN
  Administered 2015-04-16: 30 mg via INTRAVENOUS

## 2015-04-16 MED ORDER — OXYCODONE-ACETAMINOPHEN 5-325 MG PO TABS
1.0000 | ORAL_TABLET | Freq: Once | ORAL | Status: AC
  Administered 2015-04-16: 1 via ORAL

## 2015-04-16 MED ORDER — KETOROLAC TROMETHAMINE 30 MG/ML IJ SOLN: 30.0000 mg | Freq: Once | INTRAMUSCULAR | Status: DC | PRN

## 2015-04-16 MED ORDER — SODIUM CHLORIDE 0.9 % IR SOLN
Status: DC | PRN
  Administered 2015-04-16: 3000 mL

## 2015-04-16 MED ORDER — OXYCODONE-ACETAMINOPHEN 5-325 MG PO TABS
ORAL_TABLET | ORAL | Status: AC
  Filled 2015-04-16: qty 1

## 2015-04-16 MED ORDER — BUPIVACAINE HCL (PF) 0.25 % IJ SOLN
INTRAMUSCULAR | Status: DC | PRN
  Administered 2015-04-16: 8 mL

## 2015-04-16 MED ORDER — NEOSTIGMINE METHYLSULFATE 10 MG/10ML IV SOLN
INTRAVENOUS | Status: DC | PRN
  Administered 2015-04-16: 3 mg via INTRAVENOUS

## 2015-04-16 MED ORDER — ROCURONIUM BROMIDE 100 MG/10ML IV SOLN
INTRAVENOUS | Status: DC | PRN
Start: 2015-04-16 — End: 2015-04-16
  Administered 2015-04-16: 30 mg via INTRAVENOUS
  Administered 2015-04-16: 10 mg via INTRAVENOUS

## 2015-04-16 MED ORDER — ONDANSETRON HCL 4 MG/2ML IJ SOLN
INTRAMUSCULAR | Status: DC | PRN
  Administered 2015-04-16: 4 mg via INTRAVENOUS

## 2015-04-16 MED ORDER — MEPERIDINE HCL 25 MG/ML IJ SOLN: 6.2500 mg | INTRAMUSCULAR | Status: DC | PRN

## 2015-04-16 MED ORDER — LIDOCAINE HCL (CARDIAC) 20 MG/ML IV SOLN
INTRAVENOUS | Status: DC | PRN
  Administered 2015-04-16: 80 mg via INTRAVENOUS

## 2015-04-16 SURGICAL SUPPLY — 25 items
CABLE HIGH FREQUENCY MONO STRZ (ELECTRODE) IMPLANT
CATH ROBINSON RED A/P 16FR (CATHETERS) ×4 IMPLANT
CLOTH BEACON ORANGE TIMEOUT ST (SAFETY) ×4 IMPLANT
DRSG COVADERM PLUS 2X2 (GAUZE/BANDAGES/DRESSINGS) ×8 IMPLANT
DRSG OPSITE POSTOP 3X4 (GAUZE/BANDAGES/DRESSINGS) ×4 IMPLANT
EVACUATOR SMOKE 8.L (FILTER) IMPLANT
FORCEPS CUTTING 33CM 5MM (CUTTING FORCEPS) IMPLANT
GAS CARTRIDGE (MEDICAL GASES) ×4 IMPLANT
GLOVE BIOGEL PI IND STRL 7.0 (GLOVE) ×2 IMPLANT
GLOVE BIOGEL PI INDICATOR 7.0 (GLOVE) ×2
GLOVE ECLIPSE 7.0 STRL STRAW (GLOVE) ×8 IMPLANT
GOWN STRL REUS W/TWL LRG LVL3 (GOWN DISPOSABLE) ×8 IMPLANT
LAPAROSCOPY HANDPIECE LONG (MISCELLANEOUS) ×4 IMPLANT
LIQUID BAND (GAUZE/BANDAGES/DRESSINGS) ×4 IMPLANT
NS IRRIG 1000ML POUR BTL (IV SOLUTION) ×4 IMPLANT
PACK LAPAROSCOPY BASIN (CUSTOM PROCEDURE TRAY) ×4 IMPLANT
PAD POSITIONING PINK XL (MISCELLANEOUS) ×4 IMPLANT
SCISSORS LAP 5X35 DISP (ENDOMECHANICALS) ×4 IMPLANT
SET IRRIG TUBING LAPAROSCOPIC (IRRIGATION / IRRIGATOR) ×4 IMPLANT
SUT VIC AB 3-0 FS2 27 (SUTURE) ×8 IMPLANT
SUT VICRYL 0 UR6 27IN ABS (SUTURE) ×4 IMPLANT
TOWEL OR 17X24 6PK STRL BLUE (TOWEL DISPOSABLE) ×8 IMPLANT
TROCAR BALLN 12MMX100 BLUNT (TROCAR) ×4 IMPLANT
TROCAR OPTI TIP 5M 100M (ENDOMECHANICALS) IMPLANT
WARMER LAPAROSCOPE (MISCELLANEOUS) ×4 IMPLANT

## 2015-04-16 NOTE — Transfer of Care (Signed)
Immediate Anesthesia Transfer of Care Note  Patient: [name]  Procedure(s) Performed: Procedure(s): LAPAROSCOPY DIAGNOSTIC (N/A)  Patient Location: PACU  Anesthesia Type:General  Level of Consciousness: awake  Airway & Oxygen Therapy: Patient Spontanous Breathing  Post-op Assessment: Report given to PACU RN  Post vital signs: stable  Filed Vitals:   04/16/15 1048  BP: 114/72  Pulse: 87  Temp: 36.8 C  Resp: 16    Complications: No apparent anesthesia complications

## 2015-04-16 NOTE — Op Note (Signed)
NAMAlinda Gates:  Entsminger, [name]             ACCOUNT NO.:  [id number]  MEDICAL RECORD NO.:  [id number]  LOCATION:  [facility]                          FACILITY:  [facility]  PHYSICIAN:  Malva LimesMark Khoa Opdahl, M.D.    DATE OF BIRTH:  1990-08-25  DATE OF PROCEDURE:  04/16/2015 DATE OF DISCHARGE:  04/16/2015                              OPERATIVE REPORT   PREOPERATIVE DIAGNOSES: 1. Chronic pelvic pain. 2. Dyspareunia.  POSTOPERATIVE DIAGNOSES: 1. Chronic pelvic pain. 2. Dyspareunia.  PROCEDURE:  Diagnostic laparoscopy.  SURGEON:  Malva LimesMark Murl Zogg, MD  ASSISTANT:  Arlyce DiceKaplan.  DRAINS:  Red rubber catheter bladder.  SPECIMENS:  None.  FINDINGS:  On examination of the pelvis, the patient had no evidence of any adhesions or endometriosis.  The anterior cul-de-sac was completely normal.  Fallopian tubes were free.  Ovaries had no evidence of any endometriosis or cyst.  There were no adhesions.  Both pelvic sidewalls were visualized and appeared to be normal.  The ureters were functioning properly.  The patient had a previous appendectomy and cholecystectomy. There were no adhesions involving the right lower quadrant or the right upper quadrant of the abdomen.  Bowel was all free.  The patient had no obvious etiology of her pelvic pain and dyspareunia.  DETAILS OF PROCEDURE:  Patient was taken to the operating room, where she was placed in dorsal supine position.  A general anesthetic was administered without difficulty.  She was then placed in dorsal lithotomy position.  She was prepped and draped in the usual fashion for this procedure.  A red rubber catheter was used to drain her bladder.  A Hulka tenaculum was applied to the anterior cervical lip.  Her umbilicus was injected with 0.25% Marcaine.  A vertical skin incision was made through the previous scar.  On entering the parietal peritoneum bluntly, the Hasson cannula was placed in the abdominal cavity.  A 3 L of carbon dioxide was then insufflated.  The  patient was then placed in Trendelenburg.  A 5-mm port was placed in the left lower quadrant under direct visualization.  The pelvis was then copiously irrigated and examination undertaken.  The findings were as noted above.  In light of no etiology of her pelvic pain, the procedure was concluded.  The pneumoperitoneum and instruments were removed.  The fascia was closed with 0 Vicryl suture in a running fashion and the skin closed with Dermabond.  The patient was awoken and taken to recovery room in stable condition.  Instrument and lap counts were correct x2.  She will be discharged home with Percocet to take p.r.n.  She will follow up in the office in 4 weeks.          ______________________________ Malva LimesMark Cyanna Neace, M.D.     MA/MEDQ  D:  04/16/2015  T:  04/16/2015  Job:  [id number]

## 2015-04-16 NOTE — Anesthesia Procedure Notes (Signed)
Procedure Name: Intubation Date/Time: 04/16/2015 12:08 PM Performed by: Cephus ShellingBURGER, Sachi Boulay A Pre-anesthesia Checklist: Patient identified, Emergency Drugs available, Suction available and Patient being monitored Patient Re-evaluated:Patient Re-evaluated prior to inductionOxygen Delivery Method: Circle system utilized and Simple face mask Preoxygenation: Pre-oxygenation with 100% oxygen Intubation Type: IV induction Ventilation: Mask ventilation without difficulty Laryngoscope Size: Mac and 3 Grade View: Grade II Tube type: Oral Tube size: 7.0 mm Number of attempts: 1 Airway Equipment and Method: Stylet Placement Confirmation: ETT inserted through vocal cords under direct vision,  positive ETCO2 and breath sounds checked- equal and bilateral Secured at: 20 (right lip) cm Tube secured with: Tape Dental Injury: Teeth and Oropharynx as per pre-operative assessment

## 2015-04-16 NOTE — H&P (Signed)
[name] is an 24 y.o. [phone] white female who presents to the OR for a dx scope for pelvic pain and dysparunia. She has had this for one yr. She has a fmhx of endometriosis. She has no h.o. Infertility. She has had two SVDs with complications. She has no h.o. STDs. Her ut is RV and has decreased mobility. Chief Complaint: HPI:  Past Medical History  Diagnosis Date  . Anxiety   . Depression   . History of kidney stones   . Costochondritis   . PONV (postoperative nausea and vomiting)     Past Surgical History  Procedure Laterality Date  . Appendectomy    . Cholecystectomy      Family History  Problem Relation Age of Onset  . Alcohol abuse Father   . Depression Father   . Mental illness Father   . Drug abuse Father   . Depression Mother   . Birth defects Sister   . Depression Maternal Grandmother   . Heart disease Maternal Grandmother   . Hypertension Maternal Grandmother   . Depression Maternal Grandfather   . Heart disease Maternal Grandfather   . Hypertension Maternal Grandfather   . Depression Paternal Grandmother   . Heart disease Paternal Grandmother   . Hypertension Paternal Grandmother   . Depression Paternal Grandfather   . Heart disease Paternal Grandfather   . Cancer Paternal Grandfather   . Alcohol abuse Paternal Grandfather   . Hypertension Paternal Grandfather    Social History:  reports that she has never smoked. She has never used smokeless tobacco. She reports that she does not drink alcohol or use illicit drugs.  Allergies:  Allergies  Allergen Reactions  . Phenergan [Promethazine Hcl] Other (See Comments)    "body shakes"    Medications Prior to Admission  Medication Sig Dispense Refill  . acetaminophen (TYLENOL) 325 MG tablet Take 650 mg by mouth every 6 (six) hours as needed for pain (for headache).    . escitalopram (LEXAPRO) 20 MG tablet Take 1 tablet (20 mg total) by mouth daily. 95 tablet 3  . Pediatric Multivit-Minerals-C  (FLINTSTONES GUMMIES PLUS PO) Take 2 tablets by mouth every morning.         Blood pressure 114/72, pulse 87, temperature 98.2 F (36.8 C), temperature source Oral, resp. rate 16, height 5\' 4"  (1.626 m), weight 147 lb (66.679 kg), last menstrual period 04/16/2015, SpO2 100 %, unknown if currently breastfeeding. BP 114/72 mmHg  Pulse 87  Temp(Src) 98.2 F (36.8 C) (Oral)  Resp 16  Ht 5\' 4"  (1.626 m)  Wt 147 lb (66.679 kg)  BMI 25.22 kg/m2  SpO2 100%  LMP 04/16/2015 Lungs: clear to auscultation bilaterally Heart: regular rate and rhythm, S1, S2 normal, no murmur, click, rub or gallop Abdomen: soft, non-tender; bowel sounds normal; no masses,  no organomegaly Pelvic Ut RV decreased mobility deviated to right.   Lab Results  Component Value Date   WBC 7.6 04/16/2015   HGB 15.1* 04/16/2015   HCT 43.4 04/16/2015   MCV 82.0 04/16/2015   PLT 326 04/16/2015   Lab Results  Component Value Date   PREGTESTUR NEGATIVE 04/16/2015    IMP/ Pelvic pain         Dysparunia Plan/ Dx scope  Patient Active Problem List   Diagnosis Date Noted  . Normal labor and delivery 11/23/2010     [name] 04/16/2015, 11:46 AM

## 2015-04-16 NOTE — Discharge Instructions (Signed)
DISCHARGE INSTRUCTIONS: Laparoscopy  The following instructions have been prepared to help you care for yourself upon your return home today.  Wound care:  Do not get the incision wet for the first 24 hours. The incision should be kept clean and dry.  The Band-Aids or dressings may be removed the day after surgery.  Should the incision become sore, red, and swollen after the first week, check with your doctor.  Personal hygiene:  Shower the day after your procedure.  Activity and limitations:  Do NOT drive or operate any equipment today.  Do NOT lift anything more than 15 pounds for 2-3 weeks after surgery.  Do NOT rest in bed all day.  Walking is encouraged. Walk each day, starting slowly with 5-minute walks 3 or 4 times a day. Slowly increase the length of your walks.  Walk up and down stairs slowly.  Do NOT do strenuous activities, such as golfing, playing tennis, bowling, running, biking, weight lifting, gardening, mowing, or vacuuming for 2-4 weeks. Ask your doctor when it is okay to start.  Diet: Eat a light meal as desired this evening. You may resume your usual diet tomorrow.  Return to work: This is dependent on the type of work you do. For the most part you can return to a desk job within a week of surgery. If you are more active at work, please discuss this with your doctor.  What to expect after your surgery: You may have a slight burning sensation when you urinate on the first day. You may have a very small amount of blood in the urine. Expect to have a small amount of vaginal discharge/light bleeding for 1-2 weeks. It is not unusual to have abdominal soreness and bruising for up to 2 weeks. You may be tired and need more rest for about 1 week. You may experience shoulder pain for 24-72 hours. Lying flat in bed may relieve it.  Call your doctor for any of the following:  Develop a fever of 100.4 or greater  Inability to urinate 6 hours after discharge from hospital  Severe  pain not relieved by pain medications  Persistent of heavy bleeding at incision site  Redness or swelling around incision site after a week  Increasing nausea or vomiting  Patient Signature________________________________________ Nurse Signature_________________________________________ Post Anesthesia Home Care Instructions  Activity: Get plenty of rest for the remainder of the day. A responsible adult should stay with you for 24 hours following the procedure.  For the next 24 hours, DO NOT: -Drive a car -Operate machinery -Drink alcoholic beverages -Take any medication unless instructed by your physician -Make any legal decisions or sign important papers.  Meals: Start with liquid foods such as gelatin or soup. Progress to regular foods as tolerated. Avoid greasy, spicy, heavy foods. If nausea and/or vomiting occur, drink only clear liquids until the nausea and/or vomiting subsides. Call your physician if vomiting continues.  Special Instructions/Symptoms: Your throat may feel dry or sore from the anesthesia or the breathing tube placed in your throat during surgery. If this causes discomfort, gargle with warm salt water. The discomfort should disappear within 24 hours.  If you had a scopolamine patch placed behind your ear for the management of post- operative nausea and/or vomiting:  1. The medication in the patch is effective for 72 hours, after which it should be removed.  Wrap patch in a tissue and discard in the trash. Wash hands thoroughly with soap and water. 2. You may remove the patch   earlier than 72 hours if you experience unpleasant side effects which may include dry mouth, dizziness or visual disturbances. 3. Avoid touching the patch. Wash your hands with soap and water after contact with the patch.   

## 2015-04-16 NOTE — Anesthesia Preprocedure Evaluation (Signed)
Anesthesia Evaluation  Patient identified by MRN, date of birth, ID band Patient awake    Reviewed: Allergy & Precautions, H&P , NPO status , Patient's Chart, lab work & pertinent test results  Airway Mallampati: II  TM Distance: >3 FB Neck ROM: full    Dental no notable dental hx. (+) Teeth Intact   Pulmonary neg pulmonary ROS,    Pulmonary exam normal        Cardiovascular negative cardio ROS Normal cardiovascular exam     Neuro/Psych    GI/Hepatic negative GI ROS, Neg liver ROS,   Endo/Other  negative endocrine ROS  Renal/GU negative Renal ROS     Musculoskeletal   Abdominal Normal abdominal exam  (+)   Peds  Hematology negative hematology ROS (+)   Anesthesia Other Findings   Reproductive/Obstetrics negative OB ROS                             Anesthesia Physical Anesthesia Plan  ASA: II  Anesthesia Plan: General   Post-op Pain Management:    Induction: Intravenous  Airway Management Planned: Oral ETT  Additional Equipment:   Intra-op Plan:   Post-operative Plan: Extubation in OR  Informed Consent: I have reviewed the patients History and Physical, chart, labs and discussed the procedure including the risks, benefits and alternatives for the proposed anesthesia with the patient or authorized representative who has indicated his/her understanding and acceptance.   Dental Advisory Given  Plan Discussed with: CRNA and Surgeon  Anesthesia Plan Comments:         Anesthesia Quick Evaluation

## 2015-04-16 NOTE — Anesthesia Postprocedure Evaluation (Signed)
Anesthesia Post Note  Patient: Titus DubinKaitlyn L Butzin  Procedure(s) Performed: Procedure(s) (LRB): LAPAROSCOPY DIAGNOSTIC (N/A)  Patient location during evaluation: PACU Anesthesia Type: General Level of consciousness: awake and alert Pain management: pain level controlled Vital Signs Assessment: post-procedure vital signs reviewed and stable Respiratory status: spontaneous breathing, nonlabored ventilation, respiratory function stable and patient connected to nasal cannula oxygen Cardiovascular status: blood pressure returned to baseline and stable Postop Assessment: no signs of nausea or vomiting Anesthetic complications: no    Last Vitals:  Filed Vitals:   04/16/15 1445 04/16/15 1500  BP: 112/77 121/68  Pulse: 78 82  Temp:    Resp: 10 9    Last Pain:  Filed Vitals:   04/16/15 1524  PainSc: 4                  Reilly Molchan JENNETTE

## 2015-04-17 ENCOUNTER — Encounter (HOSPITAL_COMMUNITY): Payer: Self-pay | Admitting: Obstetrics and Gynecology

## 2016-06-14 ENCOUNTER — Emergency Department (HOSPITAL_COMMUNITY)
Admission: EM | Admit: 2016-06-14 | Discharge: 2016-06-15 | Disposition: A | Discharge status: Other Acute Inpatient Hospital | Payer: Managed Care, Other (non HMO) | Attending: Emergency Medicine | Admitting: Emergency Medicine

## 2016-06-14 ENCOUNTER — Encounter (HOSPITAL_COMMUNITY): Payer: Self-pay | Admitting: Emergency Medicine

## 2016-06-14 DIAGNOSIS — F332 Major depressive disorder, recurrent severe without psychotic features: Secondary | ICD-10-CM | POA: Diagnosis present

## 2016-06-14 DIAGNOSIS — Z5181 Encounter for therapeutic drug level monitoring: Secondary | ICD-10-CM | POA: Insufficient documentation

## 2016-06-14 DIAGNOSIS — R45851 Suicidal ideations: Secondary | ICD-10-CM | POA: Diagnosis present

## 2016-06-14 DIAGNOSIS — R112 Nausea with vomiting, unspecified: Secondary | ICD-10-CM | POA: Diagnosis not present

## 2016-06-14 HISTORY — DX: Bipolar disorder, unspecified: F31.9

## 2016-06-14 LAB — URINALYSIS, ROUTINE W REFLEX MICROSCOPIC
Bilirubin Urine: NEGATIVE
Glucose, UA: NEGATIVE mg/dL
HGB URINE DIPSTICK: NEGATIVE
Ketones, ur: 80 mg/dL — AB
LEUKOCYTES UA: NEGATIVE
Nitrite: NEGATIVE
PROTEIN: 30 mg/dL — AB
Specific Gravity, Urine: 1.026 (ref 1.005–1.030)
pH: 5 (ref 5.0–8.0)

## 2016-06-14 LAB — COMPREHENSIVE METABOLIC PANEL
ALBUMIN: 4.7 g/dL (ref 3.5–5.0)
ALK PHOS: 53 U/L (ref 38–126)
ALT: 18 U/L (ref 14–54)
AST: 22 U/L (ref 15–41)
Anion gap: 10 (ref 5–15)
BILIRUBIN TOTAL: 0.6 mg/dL (ref 0.3–1.2)
BUN: 11 mg/dL (ref 6–20)
CO2: 23 mmol/L (ref 22–32)
CREATININE: 0.78 mg/dL (ref 0.44–1.00)
Calcium: 9.2 mg/dL (ref 8.9–10.3)
Chloride: 108 mmol/L (ref 101–111)
GFR calc Af Amer: >60 mL/min (ref >60)
GFR calc non Af Amer: >60 mL/min (ref >60)
Glucose, Bld: 102 mg/dL — ABNORMAL HIGH (ref 65–99)
Potassium: 3.3 mmol/L — ABNORMAL LOW (ref 3.5–5.1)
Sodium: 141 mmol/L (ref 135–145)
TOTAL PROTEIN: 7.9 g/dL (ref 6.5–8.1)

## 2016-06-14 LAB — RAPID URINE DRUG SCREEN, HOSP PERFORMED
AMPHETAMINES: NOT DETECTED
BARBITURATES: NOT DETECTED
BENZODIAZEPINES: NOT DETECTED
Cocaine: NOT DETECTED
Opiates: NOT DETECTED
Tetrahydrocannabinol: NOT DETECTED

## 2016-06-14 LAB — CBC
HEMATOCRIT: 39 % (ref 36.0–46.0)
Hemoglobin: 14.4 g/dL (ref 12.0–15.0)
MCH: 29 pg (ref 26.0–34.0)
MCHC: 36.9 g/dL — ABNORMAL HIGH (ref 30.0–36.0)
MCV: 78.5 fL (ref 78.0–100.0)
PLATELETS: 259 10*3/uL (ref 150–400)
RBC: 4.97 MIL/uL (ref 3.87–5.11)
RDW: 12.2 % (ref 11.5–15.5)
WBC: 8.6 10*3/uL (ref 4.0–10.5)

## 2016-06-14 LAB — ACETAMINOPHEN LEVEL: Acetaminophen (Tylenol), Serum: <10 ug/mL — ABNORMAL LOW (ref 10–30)

## 2016-06-14 LAB — ETHANOL

## 2016-06-14 LAB — LIPASE, BLOOD: Lipase: 20 U/L (ref 11–51)

## 2016-06-14 LAB — SALICYLATE LEVEL: Salicylate Lvl: <7 mg/dL (ref 2.8–30.0)

## 2016-06-14 LAB — POC URINE PREG, ED: PREG TEST UR: NEGATIVE

## 2016-06-14 MED ORDER — SODIUM CHLORIDE 0.9 % IV BOLUS (SEPSIS)
1000.0000 mL | Freq: Once | INTRAVENOUS | Status: AC
  Administered 2016-06-14: 1000 mL via INTRAVENOUS

## 2016-06-14 MED ORDER — ONDANSETRON 4 MG PO TBDP
4.0000 mg | ORAL_TABLET | Freq: Three times a day (TID) | ORAL | Status: DC | PRN
  Administered 2016-06-14: 4 mg via ORAL

## 2016-06-14 MED ORDER — CLONAZEPAM 0.5 MG PO TABS
0.5000 mg | ORAL_TABLET | Freq: Two times a day (BID) | ORAL | Status: DC
  Administered 2016-06-14: 0.5 mg via ORAL
  Filled 2016-06-14: qty 1

## 2016-06-14 MED ORDER — ONDANSETRON HCL 4 MG/2ML IJ SOLN
4.0000 mg | Freq: Once | INTRAMUSCULAR | Status: AC
  Administered 2016-06-14: 4 mg via INTRAVENOUS
  Filled 2016-06-14: qty 2

## 2016-06-14 MED ORDER — TOPIRAMATE 25 MG PO TABS
50.0000 mg | ORAL_TABLET | Freq: Two times a day (BID) | ORAL | Status: DC
  Administered 2016-06-14 – 2016-06-15 (×2): 50 mg via ORAL
  Filled 2016-06-14 (×2): qty 2

## 2016-06-14 MED ORDER — QUETIAPINE FUMARATE 100 MG PO TABS
200.0000 mg | ORAL_TABLET | Freq: Every day | ORAL | Status: DC
  Administered 2016-06-14: 200 mg via ORAL
  Filled 2016-06-14: qty 2

## 2016-06-14 MED ORDER — ONDANSETRON 4 MG PO TBDP: 4.0000 mg | ORAL_TABLET | Freq: Once | ORAL | Status: DC | PRN

## 2016-06-14 MED ORDER — ONDANSETRON 4 MG PO TBDP
4.0000 mg | ORAL_TABLET | Freq: Once | ORAL | Status: AC
  Administered 2016-06-14: 4 mg via ORAL
  Filled 2016-06-14: qty 1

## 2016-06-14 MED ORDER — LIOTHYRONINE SODIUM 25 MCG PO TABS
25.0000 ug | ORAL_TABLET | Freq: Every day | ORAL | Status: DC
  Administered 2016-06-15: 25 ug via ORAL
  Filled 2016-06-14: qty 1

## 2016-06-14 NOTE — ED Triage Notes (Signed)
Pt states she originally came for depression and SI thoughts but also has been vomiting since this am. Pt reports she has liquor last night for the first time in 2 years. Pt reports she has a plan but does not want to talk about it. Hx of attempting suicide in 2013.

## 2016-06-14 NOTE — ED Notes (Signed)
Pt given ginger ale, saltine crackers and is tolerating w/o difficulty.

## 2016-06-14 NOTE — ED Provider Notes (Signed)
Emergency Department Provider Note   I have reviewed the triage vital signs and the nursing notes.   HISTORY  Chief Complaint Suicidal and Emesis   HPI [name] is a 26 y.o. female with PMH of bipolar disorder presents to the emergency department for evaluation of increased depression, suicidal thinking with the plan, and new onset vomiting this morning. Patient states that she drank last night which typically does not do and had vomiting all day today. She describes diffuse abdominal discomfort with no diarrhea. No blood in her emesis. She denies ingestion. No chest pain or difficulty breathing.   She feels as if her depression is gradually worsened over the past several weeks. She has been compliant with her medications. She states she has made a plan to hurt herself but will not reveal this to me. She denies any homicidal ideation. No auditory or visual hallucinations. She denies other drug use.    Past Medical History:  Diagnosis Date  . Anxiety   . Bipolar 1 disorder (HCC)   . Costochondritis   . Depression   . History of kidney stones   . PONV (postoperative nausea and vomiting)     Patient Active Problem List   Diagnosis Date Noted  . Normal labor and delivery 11/23/2010    Past Surgical History:  Procedure Laterality Date  . APPENDECTOMY    . CHOLECYSTECTOMY    . LAPAROSCOPY N/A 04/16/2015   Procedure: LAPAROSCOPY DIAGNOSTIC;  Surgeon: Levi Aland, MD;  Location: [facility];  Service: Gynecology;  Laterality: N/A;    Current Outpatient Rx  . Order #: [id number] Class: Historical Med  . Order #: [id number] Class: Historical Med  . Order #: [id number] Class: Historical Med  . Order #: [id number] Class: Historical Med  . Order #: [id number] Class: Historical Med    Allergies Phenergan [promethazine hcl]  Family History  Problem Relation Age of Onset  . Alcohol abuse Father   . Depression Father   . Mental illness Father   . Drug abuse Father   .  Depression Mother   . Birth defects Sister   . Depression Maternal Grandmother   . Heart disease Maternal Grandmother   . Hypertension Maternal Grandmother   . Depression Maternal Grandfather   . Heart disease Maternal Grandfather   . Hypertension Maternal Grandfather   . Depression Paternal Grandmother   . Heart disease Paternal Grandmother   . Hypertension Paternal Grandmother   . Depression Paternal Grandfather   . Heart disease Paternal Grandfather   . Cancer Paternal Grandfather   . Alcohol abuse Paternal Grandfather   . Hypertension Paternal Grandfather     Social History Social History  Substance Use Topics  . Smoking status: Never Smoker  . Smokeless tobacco: Never Used  . Alcohol use Yes    Review of Systems  Constitutional: No fever/chills Eyes: No visual changes. ENT: No sore throat. Cardiovascular: Denies chest pain. Respiratory: Denies shortness of breath. Gastrointestinal: Positive diffuse mild abdominal pain. Positive nausea and vomiting.  No diarrhea.  No constipation. Genitourinary: Negative for dysuria. Musculoskeletal: Negative for back pain.  Skin: Negative for rash. Neurological: Negative for headaches, focal weakness or numbness.  10-point ROS otherwise negative.  ____________________________________________   PHYSICAL EXAM:  VITAL SIGNS: ED Triage Vitals  Enc Vitals Group     BP 06/14/16 1754 110/77     Pulse Rate 06/14/16 1754 95     Resp 06/14/16 1754 18     Temp 06/14/16 1754 98.1 F (36.7 C)  Temp Source 06/14/16 1754 Oral     SpO2 06/14/16 1754 100 %     Pain Score 06/14/16 1756 4   Constitutional: Alert and oriented. Well appearing but tearful at times.  Eyes: Conjunctivae are normal.  Head: Atraumatic. Nose: No congestion/rhinnorhea. Mouth/Throat: Mucous membranes are moist.  Oropharynx non-erythematous. Neck: No stridor.  Cardiovascular: Normal rate, regular rhythm. Good peripheral circulation. Grossly normal heart  sounds.   Respiratory: Normal respiratory effort.  No retractions. Lungs CTAB. Gastrointestinal: Soft and nontender. No distention.  Musculoskeletal: No lower extremity tenderness nor edema. No gross deformities of extremities. Neurologic:  Normal speech and language. No gross focal neurologic deficits are appreciated.  Skin:  Skin is warm, dry and intact. No rash noted. Psychiatric: Mood and affect are flat. Speech and behavior are normal.  ____________________________________________   LABS (all labs ordered are listed, but only abnormal results are displayed)  Labs Reviewed  COMPREHENSIVE METABOLIC PANEL - Abnormal; Notable for the following:       Result Value   Potassium 3.3 (*)    Glucose, Bld 102 (*)    All other components within normal limits  ACETAMINOPHEN LEVEL - Abnormal; Notable for the following:    Acetaminophen (Tylenol), Serum <10 (*)    All other components within normal limits  CBC - Abnormal; Notable for the following:    MCHC 36.9 (*)    All other components within normal limits  URINALYSIS, ROUTINE W REFLEX MICROSCOPIC - Abnormal; Notable for the following:    APPearance CLOUDY (*)    Ketones, ur 80 (*)    Protein, ur 30 (*)    Bacteria, UA MANY (*)    Squamous Epithelial / LPF 6-30 (*)    All other components within normal limits  ETHANOL  SALICYLATE LEVEL  RAPID URINE DRUG SCREEN, HOSP PERFORMED  LIPASE, BLOOD  POC URINE PREG, ED   ____________________________________________  RADIOLOGY  None ____________________________________________   PROCEDURES  Procedure(s) performed:   Procedures  None ____________________________________________   INITIAL IMPRESSION / ASSESSMENT AND PLAN / ED COURSE  Pertinent labs & imaging results that were available during my care of the patient were reviewed by me and considered in my medical decision making (see chart for details).  Patient presents to the emergency department for evaluation of  increased suicidal thinking with plan which she'll not share with me. She's been compliant with her medications. She has been having significant vomiting since drinking heavily last night. Denies ingestion. Vital signs are unremarkable. Patient's abdomen is soft and nontender to palpation. [facility] for medical clearance labs, the fluids, antiemetics.   10:35 PM Patient is tolerating PO. She is feeling better. Labs are unremarkable. Plan for continued supportive care with Zofran PRN. Restarted home medication. Place TTS evaluation order and psych hold order. Patient is here voluntarily.  ____________________________________________  FINAL CLINICAL IMPRESSION(S) / ED DIAGNOSES  Final diagnoses:  Suicidal ideation  Non-intractable vomiting with nausea, unspecified vomiting type     MEDICATIONS GIVEN DURING THIS VISIT:  Medications  clonazePAM (KLONOPIN) tablet 0.5 mg (not administered)  liothyronine (CYTOMEL) tablet 25 mcg (not administered)  QUEtiapine (SEROQUEL) tablet 200 mg (not administered)  topiramate (TOPAMAX) tablet 50 mg (not administered)  ondansetron (ZOFRAN-ODT) disintegrating tablet 4 mg (not administered)  sodium chloride 0.9 % bolus 1,000 mL (0 mLs Intravenous Stopped 06/14/16 2159)  ondansetron (ZOFRAN) injection 4 mg (4 mg Intravenous Given 06/14/16 1835)     NEW OUTPATIENT MEDICATIONS STARTED DURING THIS VISIT:  None   Note:  This  document was prepared using Conservation officer, historic buildingsDragon voice recognition software and may include unintentional dictation errors.  Alona BeneJoshua Alan Drummer, MD Emergency Medicine   Maia PlanJoshua G Loyalty Arentz, MD 06/14/16 [phone]

## 2016-06-14 NOTE — ED Notes (Signed)
[facility] Expected date:  Expected time:  Means of arrival:  Comments: 15

## 2016-06-14 NOTE — ED Notes (Signed)
Pt changed into wine scrubs and security to bedside to wand pt.

## 2016-06-14 NOTE — ED Notes (Signed)
Pt is aware of the need for urine specimen.

## 2016-06-15 ENCOUNTER — Encounter (HOSPITAL_COMMUNITY): Payer: Self-pay

## 2016-06-15 ENCOUNTER — Inpatient Hospital Stay (HOSPITAL_COMMUNITY)
Admission: AD | Admit: 2016-06-15 | Discharge: 2016-06-26 | DRG: 885 | Disposition: A | Discharge status: Home / Self Care | Payer: 59 | Source: Intra-hospital | Attending: Psychiatry | Admitting: Psychiatry

## 2016-06-15 DIAGNOSIS — F332 Major depressive disorder, recurrent severe without psychotic features: Secondary | ICD-10-CM | POA: Diagnosis present

## 2016-06-15 DIAGNOSIS — Z9049 Acquired absence of other specified parts of digestive tract: Secondary | ICD-10-CM | POA: Diagnosis not present

## 2016-06-15 DIAGNOSIS — F410 Panic disorder [episodic paroxysmal anxiety] without agoraphobia: Secondary | ICD-10-CM | POA: Diagnosis present

## 2016-06-15 DIAGNOSIS — Z87442 Personal history of urinary calculi: Secondary | ICD-10-CM

## 2016-06-15 DIAGNOSIS — F4310 Post-traumatic stress disorder, unspecified: Secondary | ICD-10-CM | POA: Diagnosis present

## 2016-06-15 DIAGNOSIS — G4700 Insomnia, unspecified: Secondary | ICD-10-CM | POA: Diagnosis present

## 2016-06-15 DIAGNOSIS — F3130 Bipolar disorder, current episode depressed, mild or moderate severity, unspecified: Secondary | ICD-10-CM

## 2016-06-15 DIAGNOSIS — R45851 Suicidal ideations: Secondary | ICD-10-CM | POA: Diagnosis present

## 2016-06-15 DIAGNOSIS — F419 Anxiety disorder, unspecified: Secondary | ICD-10-CM | POA: Diagnosis present

## 2016-06-15 DIAGNOSIS — Z62819 Personal history of unspecified abuse in childhood: Secondary | ICD-10-CM | POA: Diagnosis present

## 2016-06-15 DIAGNOSIS — Z6826 Body mass index (BMI) 26.0-26.9, adult: Secondary | ICD-10-CM

## 2016-06-15 DIAGNOSIS — F429 Obsessive-compulsive disorder, unspecified: Secondary | ICD-10-CM | POA: Diagnosis present

## 2016-06-15 DIAGNOSIS — Z818 Family history of other mental and behavioral disorders: Secondary | ICD-10-CM | POA: Diagnosis not present

## 2016-06-15 DIAGNOSIS — R630 Anorexia: Secondary | ICD-10-CM | POA: Diagnosis present

## 2016-06-15 DIAGNOSIS — Z811 Family history of alcohol abuse and dependence: Secondary | ICD-10-CM | POA: Diagnosis not present

## 2016-06-15 DIAGNOSIS — Z813 Family history of other psychoactive substance abuse and dependence: Secondary | ICD-10-CM | POA: Diagnosis not present

## 2016-06-15 DIAGNOSIS — F322 Major depressive disorder, single episode, severe without psychotic features: Secondary | ICD-10-CM

## 2016-06-15 DIAGNOSIS — Z79899 Other long term (current) drug therapy: Secondary | ICD-10-CM | POA: Diagnosis not present

## 2016-06-15 DIAGNOSIS — Z888 Allergy status to other drugs, medicaments and biological substances status: Secondary | ICD-10-CM | POA: Diagnosis not present

## 2016-06-15 DIAGNOSIS — Z9889 Other specified postprocedural states: Secondary | ICD-10-CM | POA: Diagnosis not present

## 2016-06-15 HISTORY — DX: Major depressive disorder, recurrent severe without psychotic features: F33.2

## 2016-06-15 HISTORY — DX: Major depressive disorder, single episode, severe without psychotic features: F32.2

## 2016-06-15 MED ORDER — HYDROXYZINE HCL 25 MG PO TABS
25.0000 mg | ORAL_TABLET | Freq: Four times a day (QID) | ORAL | Status: DC | PRN
  Administered 2016-06-15 – 2016-06-16 (×2): 25 mg via ORAL
  Filled 2016-06-15 (×2): qty 1

## 2016-06-15 MED ORDER — LIOTHYRONINE SODIUM 25 MCG PO TABS
25.0000 ug | ORAL_TABLET | Freq: Every day | ORAL | Status: DC
  Administered 2016-06-16 – 2016-06-26 (×11): 25 ug via ORAL
  Filled 2016-06-15 (×13): qty 1

## 2016-06-15 MED ORDER — POTASSIUM CHLORIDE CRYS ER 20 MEQ PO TBCR
20.0000 meq | EXTENDED_RELEASE_TABLET | Freq: Once | ORAL | Status: AC
  Administered 2016-06-15: 20 meq via ORAL
  Filled 2016-06-15: qty 1

## 2016-06-15 MED ORDER — ONDANSETRON 4 MG PO TBDP: 4.0000 mg | ORAL_TABLET | Freq: Three times a day (TID) | ORAL | Status: DC | PRN

## 2016-06-15 MED ORDER — ACETAMINOPHEN 325 MG PO TABS
650.0000 mg | ORAL_TABLET | Freq: Four times a day (QID) | ORAL | Status: DC | PRN
  Administered 2016-06-15 – 2016-06-17 (×3): 650 mg via ORAL
  Filled 2016-06-15 (×3): qty 2

## 2016-06-15 MED ORDER — ALUM & MAG HYDROXIDE-SIMETH 200-200-20 MG/5ML PO SUSP: 30.0000 mL | ORAL | Status: DC | PRN

## 2016-06-15 MED ORDER — TOPIRAMATE 25 MG PO TABS
50.0000 mg | ORAL_TABLET | Freq: Two times a day (BID) | ORAL | Status: DC
  Administered 2016-06-15 – 2016-06-26 (×21): 50 mg via ORAL
  Filled 2016-06-15 (×28): qty 2

## 2016-06-15 MED ORDER — QUETIAPINE FUMARATE 200 MG PO TABS
200.0000 mg | ORAL_TABLET | Freq: Every day | ORAL | Status: DC
  Administered 2016-06-15: 200 mg via ORAL
  Filled 2016-06-15 (×3): qty 1

## 2016-06-15 MED ORDER — IBUPROFEN 600 MG PO TABS
600.0000 mg | ORAL_TABLET | Freq: Four times a day (QID) | ORAL | Status: DC | PRN
  Administered 2016-06-15 – 2016-06-20 (×5): 600 mg via ORAL
  Filled 2016-06-15 (×5): qty 1

## 2016-06-15 MED ORDER — MAGNESIUM HYDROXIDE 400 MG/5ML PO SUSP: 30.0000 mL | Freq: Every day | ORAL | Status: DC | PRN

## 2016-06-15 NOTE — Progress Notes (Signed)
[name] is a 26 year old female being admitted voluntarily to [facility] from [facility].  She came to the ED with increasing depress and cycling thoughts of wanting to kill herself.  She is reporting feeling of guilt and anxiety.  She denied HI or A/V hallucinations.  [facility].  She denies any medical issues at this time and appears to be in no physical distress.  She is diagnosed with Bipolar I Disorder.  She is c/o of headache that was 7/10.  She currently denies suicidal ideation but did state that it comes and goes.  She agreed to contract for safety on the unit.  Oriented her to the unit.  Admission paperwork completed and signed.  Belongings searched and secured in locker # 21.  Skin assessment completed and no skin issues noted, she does have multiple tattoos.  Q 15 minute checks initiated for safety.  We will monitor the progress towards her goals.

## 2016-06-15 NOTE — ED Notes (Signed)
[facility]. All belongings were returned to pt who signed for same. She was calm and cooperative throughout her visit and at the time of discharge.

## 2016-06-15 NOTE — Progress Notes (Signed)
Adult Psychoeducational Group Note  Date:  06/15/2016 Time:  8:52 PM  Group Topic/Focus:  Wrap-Up Group:   The focus of this group is to help patients review their daily goal of treatment and discuss progress on daily workbooks.  Participation Level:  Active  Participation Quality:  Appropriate  Affect:  Appropriate  Cognitive:  Alert  Insight: Appropriate  Engagement in Group:  Engaged  Modes of Intervention:  Discussion  Additional Comments:  Patient rated her day a 4. Patient's goal for today was to eat and drink. Patient some what met her goal.  Meriem Lemieux L Indie Boehne 06/15/2016, 8:52 PM

## 2016-06-15 NOTE — BH Assessment (Signed)
[facility] Assessment Progress Note  Per Thedore MinsMojeed Akintayo, MD, this pt requires psychiatric hospitalization at this time.  Thurman CoyerEric Kaplan, RN, [facility]; they will be ready to receive pt at 13:00.  Pt has signed Voluntary Admission and Consent for Treatment, as well as Consent to Release Information to her PCP, and signed [facility].  Pt's nurse, Diane, has been notified, and agrees to send original paperwork along with pt via [facility], and to call report to [phone].  Doylene Canninghomas Jaidy Cottam, MA Triage Specialist [phone]

## 2016-06-15 NOTE — Tx Team (Signed)
Initial Treatment Plan 06/15/2016 5:54 PM Tina DubinKaitlyn L Gates [id number]    PATIENT STRESSORS: Financial difficulties Medication change or noncompliance   PATIENT STRENGTHS: Wellsite geologistCommunication skills General fund of knowledge Motivation for treatment/growth Physical Health Supportive family/friends   PATIENT IDENTIFIED PROBLEMS: Depression  Suicidal ideation  Anxiety  "I need coping skills because I have zero"  "I need to learn how to let things go, it affects my relationships"             DISCHARGE CRITERIA:  Improved stabilization in mood, thinking, and/or behavior Verbal commitment to aftercare and medication compliance  PRELIMINARY DISCHARGE PLAN: Outpatient therapy Medication management  PATIENT/FAMILY INVOLVEMENT: This treatment plan has been presented to and reviewed with the patient, [name].  The patient and family have been given the opportunity to ask questions and make suggestions.  Levin BaconHeather V Asier Desroches, RN 06/15/2016, 5:54 PM

## 2016-06-15 NOTE — ED Notes (Signed)
SBAR Report received from previous nurse. Pt received calm and visible on unit. Pt denies current SI/ HI, A/V H, depression, anxiety, or pain at this time stating that she is "to tired to be anxious", and appears otherwise stable pt endorses nausea at this time . Pt reminded of camera surveillance, q 15 min rounds, and rules of the milieu. Will continue to assess.
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
## 2016-06-15 NOTE — Progress Notes (Signed)
D: Pt presents with anxious, depressed affect and mood; she reports feeling "sluggish."  She describes her day as "okay" and reports her goal was to "eat today a little bit."  Pt reports she ate some mashed potatoes, pineapple, and [place]urkey.  She reports she was having difficulty eating because she was nauseous previously.  Reports passive SI without a plan.  Denies HI, denies hallucinations, reports pain from headache of 5/10.  Pt has been visible in milieu and she attended evening group.  A: Introduced self to pt.  Actively listened to pt and offered support and encouragement.  Medication administered per order.  PRN medication administered for pain and anxiety.  Gatorade provided and PO fluid intake encouraged.  Q15 minute safety checks maintained.   R: Pt is compliant with medications.  She is safe on the unit and pt verbally contracts for safety.

## 2016-06-15 NOTE — Progress Notes (Signed)
Writer introduced self to pt. Pt irritable on approach. Pt c/o progressive headache. Pt given tylenol at her request. Pt reports feeling increasingly depressed. Pt verbally contracts for safety.

## 2016-06-15 NOTE — Progress Notes (Deleted)
Adult Psychoeducational Group Note  Date:  06/15/2016 Time:  8:38 PM  Group Topic/Focus:  Wrap-Up Group:   The focus of this group is to help patients review their daily goal of treatment and discuss progress on daily workbooks.  Participation Level:  Active  Participation Quality:  Appropriate  Affect:  Appropriate  Cognitive:  Alert  Insight: Appropriate  Engagement in Group:  Engaged  Modes of Intervention:  Discussion  Additional Comments:  Patient rated her day a 10. Patient's goal for today was to call transitional housing. Patient met goal.   [name] L Cyndia Degraff 06/15/2016, 8:38 PM

## 2016-06-16 DIAGNOSIS — Z888 Allergy status to other drugs, medicaments and biological substances status: Secondary | ICD-10-CM

## 2016-06-16 DIAGNOSIS — F332 Major depressive disorder, recurrent severe without psychotic features: Principal | ICD-10-CM

## 2016-06-16 DIAGNOSIS — Z9889 Other specified postprocedural states: Secondary | ICD-10-CM

## 2016-06-16 DIAGNOSIS — Z79899 Other long term (current) drug therapy: Secondary | ICD-10-CM

## 2016-06-16 DIAGNOSIS — Z813 Family history of other psychoactive substance abuse and dependence: Secondary | ICD-10-CM

## 2016-06-16 DIAGNOSIS — Z811 Family history of alcohol abuse and dependence: Secondary | ICD-10-CM

## 2016-06-16 DIAGNOSIS — Z818 Family history of other mental and behavioral disorders: Secondary | ICD-10-CM

## 2016-06-16 MED ORDER — ZIPRASIDONE HCL 20 MG PO CAPS
20.0000 mg | ORAL_CAPSULE | Freq: Two times a day (BID) | ORAL | Status: DC
  Administered 2016-06-16 – 2016-06-17 (×3): 20 mg via ORAL
  Filled 2016-06-16 (×6): qty 1

## 2016-06-16 MED ORDER — LORAZEPAM 1 MG PO TABS
1.0000 mg | ORAL_TABLET | Freq: Four times a day (QID) | ORAL | Status: AC | PRN
Start: 2016-06-16 — End: 2016-06-19
  Administered 2016-06-18: 1 mg via ORAL
  Filled 2016-06-16: qty 1

## 2016-06-16 MED ORDER — ADULT MULTIVITAMIN W/MINERALS CH
1.0000 | ORAL_TABLET | Freq: Every day | ORAL | Status: DC
  Administered 2016-06-16 – 2016-06-26 (×11): 1 via ORAL
  Filled 2016-06-16 (×14): qty 1

## 2016-06-16 MED ORDER — ONDANSETRON 4 MG PO TBDP
4.0000 mg | ORAL_TABLET | Freq: Four times a day (QID) | ORAL | Status: AC | PRN
  Administered 2016-06-18: 4 mg via ORAL
  Filled 2016-06-16: qty 1

## 2016-06-16 MED ORDER — LOPERAMIDE HCL 2 MG PO CAPS: 2.0000 mg | ORAL_CAPSULE | ORAL | Status: AC | PRN

## 2016-06-16 MED ORDER — HYDROXYZINE HCL 25 MG PO TABS
25.0000 mg | ORAL_TABLET | Freq: Four times a day (QID) | ORAL | Status: AC | PRN
  Administered 2016-06-16 – 2016-06-19 (×4): 25 mg via ORAL
  Filled 2016-06-16 (×5): qty 1

## 2016-06-16 MED ORDER — QUETIAPINE FUMARATE 100 MG PO TABS
100.0000 mg | ORAL_TABLET | Freq: Every evening | ORAL | Status: DC | PRN
  Administered 2016-06-16: 100 mg via ORAL
  Filled 2016-06-16 (×5): qty 1

## 2016-06-16 MED ORDER — THIAMINE HCL 100 MG/ML IJ SOLN: 100.0000 mg | Freq: Once | INTRAMUSCULAR | Status: DC

## 2016-06-16 MED ORDER — VITAMIN B-1 100 MG PO TABS
100.0000 mg | ORAL_TABLET | Freq: Every day | ORAL | Status: DC
  Administered 2016-06-17 – 2016-06-26 (×10): 100 mg via ORAL
  Filled 2016-06-16 (×12): qty 1

## 2016-06-16 MED ORDER — QUETIAPINE FUMARATE ER 200 MG PO TB24
200.0000 mg | ORAL_TABLET | Freq: Every day | ORAL | Status: DC
  Filled 2016-06-16: qty 1

## 2016-06-16 NOTE — [facility] Suicide Risk Assessment (Signed)
[facility] INPATIENT:  Family/Significant Other Suicide Prevention Education  Suicide Prevention Education:  Patient Refusal for Family/Significant Other Suicide Prevention Education: The patient [name] has refused to provide written consent for family/significant other to be provided Family/Significant Other Suicide Prevention Education during admission and/or prior to discharge.  Physician notified.  Verdene LennertLauren C Fahima Cifelli 06/16/2016, 11:38 AM
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
## 2016-06-16 NOTE — [facility] Group Notes (Signed)
[facility] LCSW Group Therapy 06/16/2016 1:15 PM  Type of Therapy: Group Therapy- Feelings about Diagnosis  Participation Level: Reserved  Participation Quality:  Appropriate  Affect:  Flat  Cognitive: Alert and Oriented   Insight:  Developing   Engagement in Therapy: Developing/Improving and Engaged   Modes of Intervention: Clarification, Confrontation, Discussion, Education, Exploration, Limit-setting, Orientation, Problem-solving, Rapport Building, Dance movement psychotherapisteality Testing, Socialization and Support  Description of Group:   This group will allow patients to explore their thoughts and feelings about diagnoses they have received. Patients will be guided to explore their level of understanding and acceptance of these diagnoses. Facilitator will encourage patients to process their thoughts and feelings about the reactions of others to their diagnosis, and will guide patients in identifying ways to discuss their diagnosis with significant others in their lives. This group will be process-oriented, with patients participating in exploration of their own experiences as well as giving and receiving support and challenge from other group members.  Summary of Progress/Problems:  Pt processed with peers the difficulty she has living with mental illness. Pt focused on her frustration related to her mother's lack of understanding of mental illness. She interacted well with peers and engaged thoughtfully in discussion.   Therapeutic Modalities:   Cognitive Behavioral Therapy Solution Focused Therapy Motivational Interviewing Relapse Prevention Therapy  Vernie ShanksLauren Prabhjot Maddux, LCSW 06/16/2016 3:14 PM

## 2016-06-16 NOTE — Progress Notes (Deleted)
D: Patient continues to report depression, hopelessness and anxiety.  She is sleeping and eating well; her energy level is normal; her concentration is good.  [place]  She denies any thoughts of self harm.  She rates her depression as a 7; hopelessness as a 10; anxiety as a 9.  Patient states she was taking klonopin prior to admission, however, she denies any withdrawal symptoms. Her affect is blunted; her mood is depressed. A: Continue to monitor medication management and MD orders.  Safety checks completed every 15 minutes per protocol.  Offer support and encouragement as needed. R: Patient is receptive to staff; her behavior is appropriate.
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
## 2016-06-16 NOTE — Progress Notes (Signed)
Recreation Therapy Notes  Animal-Assisted Activity (AAA) Program Checklist/Progress Notes Patient Eligibility Criteria Checklist & Daily Group note for Rec TxIntervention  Date: 02.06.2018 Time: 2:45pm Location: [address] Dayroom    AAA/T Program Assumption of Risk Form signed by Patient/ or Parent Legal Guardian Yes  Patient is free of allergies or sever asthma Yes  Patient reports no fear of animals Yes  Patient reports no history of cruelty to animals Yes  Patient understands his/her participation is voluntary Yes  Patient washes hands before animal contact Yes  Patient washes hands after animal contact Yes  Behavioral Response: Appropriate   Education:Hand Washing, Appropriate Animal Interaction   Education Outcome: Acknowledges education.   Clinical Observations/Feedback: Patient attended session and interacted appropriately with therapy dog and peers.    Claudie Rathbone L Makaya Juneau, LRT/CTRS        Yun Gutierrez L 06/16/2016 3:09 PM 

## 2016-06-16 NOTE — BHH Suicide Risk Assessment (Signed)
[facility]   Nursing information obtained from:  Patient Demographic factors:  Adolescent or young adult, Caucasian, Living alone Current Mental Status:  Self-harm thoughts Loss Factors:  Financial problems / change in socioeconomic status Historical Factors:  Prior suicide attempts, Family history of mental illness or substance abuse Risk Reduction Factors:  Responsible for children under 24 years of age, Employed  Total Time spent with patient: 45 minutes Principal Problem: Bipolar Disorder, Depressed  Diagnosis:   Patient Active Problem List   Diagnosis Date Noted  . Major depressive disorder, recurrent severe without psychotic features (HCC) [F33.2] 06/15/2016  . Major depressive disorder, single episode, severe without psychosis (HCC) [F32.2] 06/15/2016  . Normal labor and delivery [O80] 11/23/2010     Continued Clinical Symptoms:  Alcohol Use Disorder Identification Test Final Score (AUDIT): 4 The "Alcohol Use Disorders Identification Test", Guidelines for Use in Primary Care, Second Edition.  World Science writerHealth Organization [facility](WHO). Score between 0-7:  no or low risk or alcohol related problems. Score between 8-15:  moderate risk of alcohol related problems. Score between 16-19:  high risk of alcohol related problems. Score 20 or above:  warrants further diagnostic evaluation for alcohol dependence and treatment.   CLINICAL FACTORS:  26 year old single female, has been diagnosed with Bipolar Disorder, reports worsening depression, sadness, anxiety, suicidal ideations. No psychotic symptoms.     Psychiatric Specialty Exam: Physical Exam  ROS  Blood pressure 126/62, pulse (!) 101, temperature 97.9 F (36.6 C), temperature source Oral, resp. rate 16, height 5\' 4"  (1.626 m), weight 69.4 kg (153 lb), last menstrual period 06/12/2016, SpO2 100 %, unknown if currently breastfeeding.Body mass index is 26.26 kg/m.   see admit note MSE    COGNITIVE FEATURES  THAT CONTRIBUTE TO RISK:  Closed-mindedness and Loss of executive function    SUICIDE RISK:   Moderate:  Frequent suicidal ideation with limited intensity, and duration, some specificity in terms of plans, no associated intent, good self-control, limited dysphoria/symptomatology, some risk factors present, and identifiable protective factors, including available and accessible social support.  PLAN OF CARE: Patient will be admitted to inpatient psychiatric unit for stabilization and safety. Will provide and encourage milieu participation. Provide medication management and maked adjustments as needed.  Will follow daily.    I certify that inpatient services furnished can reasonably be expected to improve the patient's condition.   Nehemiah MassedOBOS, FERNANDO, MD 06/16/2016, 11:57 AM

## 2016-06-16 NOTE — Progress Notes (Signed)
D: Patient continues to endorse passive suicidal thoughts with no specific plan.  She is sleeping fair; her appetite is poor; her energy level is low; her concentration is poor.  She rates her depression and hopelessness as a 10; anxiety as a 9.  Her goal today is to "learn coping skills, learn to control and function daily with depression and bipolar."  Her affect is blunted; her mood is depressed and sad.   A: Continue to monitor medication management and MD orders.  Safety checks completed every 15 minutes per protocol. Offer support and encouragement as needed. R: Patient is receptive to staff; her behavior is appropriate.

## 2016-06-16 NOTE — Tx Team (Signed)
Interdisciplinary Treatment and Diagnostic Plan Update  06/16/2016 Time of Session: 12:18 PM  DENVER HARDER MRN: [id number]  Principal Diagnosis: Major depressive disorder, recurrent severe without psychotic features ([place])  Secondary Diagnoses: Principal Problem:   Major depressive disorder, recurrent severe without psychotic features ([place]) Active Problems:   Major depressive disorder, single episode, severe without psychosis ([place])   Current Medications:  Current Facility-Administered Medications  Medication Dose Route Frequency Provider Last Rate Last Dose  . acetaminophen (TYLENOL) tablet 650 mg  650 mg Oral Q6H PRN Patrecia Pour, NP   650 mg at 06/16/16 0806  . alum & mag hydroxide-simeth (MAALOX/MYLANTA) 200-200-20 MG/5ML suspension 30 mL  30 mL Oral Q4H PRN Patrecia Pour, NP      . hydrOXYzine (ATARAX/VISTARIL) tablet 25 mg  25 mg Oral Q6H PRN Jenne Campus, MD      . ibuprofen (ADVIL,MOTRIN) tablet 600 mg  600 mg Oral Q6H PRN Laverle Hobby, PA-C   600 mg at 06/15/16 2102  . liothyronine (CYTOMEL) tablet 25 mcg  25 mcg Oral Daily Patrecia Pour, NP   25 mcg at 06/16/16 1207  . loperamide (IMODIUM) capsule 2-4 mg  2-4 mg Oral PRN Jenne Campus, MD      . LORazepam (ATIVAN) tablet 1 mg  1 mg Oral Q6H PRN Myer Peer Cobos, MD      . magnesium hydroxide (MILK OF MAGNESIA) suspension 30 mL  30 mL Oral Daily PRN Patrecia Pour, NP      . multivitamin with minerals tablet 1 tablet  1 tablet Oral Daily Jenne Campus, MD   1 tablet at 06/16/16 1206  . ondansetron (ZOFRAN-ODT) disintegrating tablet 4 mg  4 mg Oral Q6H PRN Jenne Campus, MD      . Derrill Memo ON 06/17/2016] thiamine (VITAMIN B-1) tablet 100 mg  100 mg Oral Daily Myer Peer Cobos, MD      . topiramate (TOPAMAX) tablet 50 mg  50 mg Oral BID Patrecia Pour, NP   50 mg at 06/16/16 0804  . ziprasidone (GEODON) capsule 20 mg  20 mg Oral BID WC Jenne Campus, MD        PTA Medications: Prescriptions Prior to  Admission  Medication Sig Dispense Refill Last Dose  . liothyronine (CYTOMEL) 25 MCG tablet Take 25 mcg by mouth daily.   06/13/2016 at Unknown time  . Multiple Vitamins-Minerals (MULTIVITAMIN GUMMIES ADULT) CHEW Chew 1 each by mouth 2 (two) times daily.   06/13/2016 at Unknown time  . QUEtiapine (SEROQUEL) 200 MG tablet Take 200 mg by mouth at bedtime.   06/13/2016 at Unknown time  . topiramate (TOPAMAX) 50 MG tablet Take 50 mg by mouth 2 (two) times daily.   06/13/2016 at Unknown time    Treatment Modalities: Medication Management, Group therapy, Case management,  1 to 1 session with clinician, Psychoeducation, Recreational therapy.  Patient Stressors: Financial difficulties Medication change or noncompliance  Patient Strengths: Network engineer for treatment/growth Physical Health Supportive family/friends  Physician Treatment Plan for Primary Diagnosis: Major depressive disorder, recurrent severe without psychotic features ([place]) Long Term Goal(s): Improvement in symptoms so as ready for discharge  Short Term Goals: Ability to verbalize feelings will improve Ability to disclose and discuss suicidal ideas Ability to demonstrate self-control will improve Ability to identify and develop effective coping behaviors will improve Ability to maintain clinical measurements within normal limits will improve Ability to verbalize feelings will improve Ability to  disclose and discuss suicidal ideas Ability to demonstrate self-control will improve Ability to identify and develop effective coping behaviors will improve  Medication Management: Evaluate patient's response, side effects, and tolerance of medication regimen.  Therapeutic Interventions: 1 to 1 sessions, Unit Group sessions and Medication administration.  Evaluation of Outcomes: Not Met  Physician Treatment Plan for Secondary Diagnosis: Principal Problem:   Major depressive disorder, recurrent  severe without psychotic features ([place]) Active Problems:   Major depressive disorder, single episode, severe without psychosis ([place])   Long Term Goal(s): Improvement in symptoms so as ready for discharge  Short Term Goals: Ability to verbalize feelings will improve Ability to disclose and discuss suicidal ideas Ability to demonstrate self-control will improve Ability to identify and develop effective coping behaviors will improve Ability to maintain clinical measurements within normal limits will improve Ability to verbalize feelings will improve Ability to disclose and discuss suicidal ideas Ability to demonstrate self-control will improve Ability to identify and develop effective coping behaviors will improve  Medication Management: Evaluate patient's response, side effects, and tolerance of medication regimen.  Therapeutic Interventions: 1 to 1 sessions, Unit Group sessions and Medication administration.  Evaluation of Outcomes: Not Met   RN Treatment Plan for Primary Diagnosis: Major depressive disorder, recurrent severe without psychotic features ([place]) Long Term Goal(s): Knowledge of disease and therapeutic regimen to maintain health will improve  Short Term Goals: Ability to verbalize feelings will improve, Ability to disclose and discuss suicidal ideas and Ability to identify and develop effective coping behaviors will improve  Medication Management: RN will administer medications as ordered by provider, will assess and evaluate patient's response and provide education to patient for prescribed medication. RN will report any adverse and/or side effects to prescribing provider.  Therapeutic Interventions: 1 on 1 counseling sessions, Psychoeducation, Medication administration, Evaluate responses to treatment, Monitor vital signs and CBGs as ordered, Perform/monitor CIWA, COWS, AIMS and Fall Risk screenings as ordered, Perform wound care treatments as ordered.  Evaluation of  Outcomes: Not Met   LCSW Treatment Plan for Primary Diagnosis: Major depressive disorder, recurrent severe without psychotic features ([place]) Long Term Goal(s): Safe transition to appropriate next level of care at discharge, Engage patient in therapeutic group addressing interpersonal concerns.  Short Term Goals: Engage patient in aftercare planning with referrals and resources, Identify triggers associated with mental health/substance abuse issues and Increase skills for wellness and recovery  Therapeutic Interventions: Assess for all discharge needs, 1 to 1 time with Social worker, Explore available resources and support systems, Assess for adequacy in community support network, Educate family and significant other(s) on suicide prevention, Complete Psychosocial Assessment, Interpersonal group therapy.  Evaluation of Outcomes: Not Met   Progress in Treatment: Attending groups: Pt is new to milieu, continuing to assess  Participating in groups: Pt is new to milieu, continuing to assess  Taking medication as prescribed: Yes, MD continues to assess for medication changes as needed Toleration medication: Yes, no side effects reported at this time Family/Significant other contact made: No, Pt declines Patient understands diagnosis: Continuing to assess Discussing patient identified problems/goals with staff: Yes Medical problems stabilized or resolved: Yes Denies suicidal/homicidal ideation: Yes Issues/concerns per patient self-inventory: None Other: N/A  New problem(s) identified: None identified at this time.   New Short Term/Long Term Goal(s): None identified at this time.   Discharge Plan or Barriers: Pt will return home and follow-up with outpatient services.   Reason for Continuation of Hospitalization: Anxiety Depression Medication stabilization Suicidal ideation Estimated Length of Stay:  3-5 days  Attendees: Patient: 06/16/2016  12:18 PM  Physician: Dr. Parke Poisson 06/16/2016  12:18  PM  Nursing: Mayra Neer, RN 06/16/2016  12:18 PM  RN Care Manager: Lars Pinks, RN 06/16/2016  12:18 PM  Social Worker: Adriana Reams, LCSW; [place], LCSW 06/16/2016  12:18 PM  Recreational Therapist:  06/16/2016  12:18 PM  Other: Lindell Spar, NP; Samuel Jester, NP 06/16/2016  12:18 PM  Other:  06/16/2016  12:18 PM  Other: 06/16/2016  12:18 PM    Scribe for Treatment Team: Gladstone Lighter, LCSW 06/16/2016 12:18 PM

## 2016-06-16 NOTE — Progress Notes (Signed)
D: Pt presents with anxious, depressed affect and mood.  She describes her day as "okay."  Pt reports her goal was to "talk to the doctor, it went good."  She reports her medications were changed.  Pt reports "one of the big reasons I was on Seroquel is because it helped me sleep."  Pt expressed that she was worried about being able to sleep tonight; reports Trazodone has not been effective in the past.  Denies HI, denies hallucinations.  She continues to endorse passive SI, stating "not right now."  Denies having suicide plan.  Pt complains of pain from headache of 9/10.  Pt has not been as interactive with peers tonight.  She did not attend evening group.   A: Met with pt and offered support and encouragement.  Actively listened to pt.  On-site provider notified of pt's concerns related to sleep.  Seroquel 100 mg PO QHS ordered and administered.  PRN medication administered for pain and anxiety.  Q15 minute safety checks maintained.    R: Pt is compliant with medications.  She is safe on the unit and she verbally contracts for safety.

## 2016-06-17 LAB — LIPID PANEL
CHOL/HDL RATIO: 3.6 ratio
CHOLESTEROL: 118 mg/dL (ref 0–200)
HDL: 33 mg/dL — AB (ref >40)
LDL Cholesterol: 65 mg/dL (ref 0–99)
TRIGLYCERIDES: 100 mg/dL (ref <150)
VLDL: 20 mg/dL (ref 0–40)

## 2016-06-17 LAB — TSH: TSH: 1.367 u[IU]/mL (ref 0.350–4.500)

## 2016-06-17 MED ORDER — MIRTAZAPINE 7.5 MG PO TABS
7.5000 mg | ORAL_TABLET | Freq: Every evening | ORAL | Status: DC | PRN
  Administered 2016-06-18: 7.5 mg via ORAL
  Filled 2016-06-17: qty 1

## 2016-06-17 MED ORDER — LAMOTRIGINE 25 MG PO TABS
25.0000 mg | ORAL_TABLET | Freq: Every day | ORAL | Status: DC
  Administered 2016-06-17 – 2016-06-22 (×5): 25 mg via ORAL
  Filled 2016-06-17 (×8): qty 1

## 2016-06-17 MED ORDER — VENLAFAXINE HCL ER 37.5 MG PO CP24
37.5000 mg | ORAL_CAPSULE | Freq: Every day | ORAL | Status: DC
  Administered 2016-06-17 – 2016-06-22 (×6): 37.5 mg via ORAL
  Filled 2016-06-17 (×8): qty 1

## 2016-06-17 MED ORDER — DM-GUAIFENESIN ER 30-600 MG PO TB12
1.0000 | ORAL_TABLET | Freq: Two times a day (BID) | ORAL | Status: DC
  Administered 2016-06-17 – 2016-06-24 (×14): 1 via ORAL
  Filled 2016-06-17 (×22): qty 1

## 2016-06-17 NOTE — BHH Group Notes (Signed)
BHH LCSW Group Therapy 06/17/2016 1:15 PM  Type of Therapy: Group Therapy- Emotion Regulation  Participation Level: Minimal  Participation Quality:  Reserved  Affect: Flat  Cognitive: Alert and Oriented   Insight:  Unable to assess  Engagement in Therapy: Developing/Improving and Engaged   Modes of Intervention: Clarification, Confrontation, Discussion, Education, Exploration, Limit-setting, Orientation, Problem-solving, Rapport Building, Dance movement psychotherapisteality Testing, Socialization and Support  Summary of Progress/Problems: The topic for group today was emotional regulation. This group focused on both positive and negative emotion identification and allowed group members to process ways to identify feelings, regulate negative emotions, and find healthy ways to manage internal/external emotions. Group members were asked to reflect on a time when their reaction to an emotion led to a negative outcome and explored how alternative responses using emotion regulation would have benefited them. Group members were also asked to discuss a time when emotion regulation was utilized when a negative emotion was experienced. Pt did not participate in group discussion but remained attentive throughout.   Tina ShanksLauren Copeland Neisen, LCSW 06/17/2016 1:53 PM

## 2016-06-17 NOTE — Progress Notes (Signed)
D: Pt was in the day room upon initial approach.  Pt presents with depressed, anxious affect and mood.  Her goal is "to be positive" and she reports she met her goal.  Pt denies SI/HI, denies hallucinations, reports pain from headache of 9/10.  Pt has been visible in milieu interacting with peers and staff appropriately.  Pt attended evening group.   A:  Actively listened to pt and offered support and encouragement. Medications administered per order.  PRN medication administered for pain and anxiety. R: Pt is safe on the unit.  Pt is compliant with medications.  Pt verbally contracts for safety.

## 2016-06-17 NOTE — Plan of Care (Signed)
Problem: Activity: Goal: Sleeping patterns will improve Outcome: Progressing Pt slept 6.75 hours last night according to flowsheet.    

## 2016-06-17 NOTE — Progress Notes (Signed)
D: Patient reports continuing depressive symptoms.  Patient is sleeping well; appetite is good; energy level is low and concentration is good.  Patient rates her depression, hopelessness and anxiety as an 8.  She denies any thoughts of self harm.  Her goal is to "talk to the doctor about my meds."  She also plans to work on her "coping skills."   A: Continue to monitor medication management and MD orders.  Safety checks completed every 15 minutes per protocol.  Offer support and encouragement as needed. R: Patient is receptive to staff; her behavior is appropriate.

## 2016-06-17 NOTE — Progress Notes (Signed)
Adult Psychoeducational Group Note  Date:  06/17/2016 Time:  8:46 PM  Group Topic/Focus:  Wrap-Up Group:   The focus of this group is to help patients review their daily goal of treatment and discuss progress on daily workbooks.  Participation Level:  Active  Participation Quality:  Appropriate  Affect:  Appropriate  Cognitive:  Alert  Insight: Appropriate  Engagement in Group:  Engaged  Modes of Intervention:  Discussion  Additional Comments:  Patient rated her day a 6-7. Patient's goal for today was to be positive. Patient met goal.  Shanavia Makela L Corisa Montini 06/17/2016, 8:46 PM

## 2016-06-17 NOTE — Progress Notes (Signed)
[facility] MD Progress Note  06/17/2016 11:38 AM [name]  MRN:  [id number] Subjective: patient states she is feeling slightly better, but still depressed. She denies suicidal ideations. She continues to report obsessive compulsive symptoms. She tends to avoid discussing actual content of obsessions, stating she feels embarrassed, but describes them as ego dystonic repetitive, intrusive thoughts which she then addresses by " talking in my head", or at times with actions such as taking " three steps". Of note, she feels that obsessive symptoms have been a trigger for her depression. Denies medication side effects, expresses concern about being on medications frequently associated with weight gain. Objective : I have discussed case with treatment team. Presents partially improved, still tearful at times, depressed, but with a more reactive affect and stating she feels more hopeful today. Denies suicidal ideations at this time. She is tolerating medications well ( currently on Geodon- new trial for her)  No disruptive or agitated behaviors on unit. TSH WNL  Principal Problem: Major depressive disorder, recurrent severe without psychotic features (HCC) Diagnosis:   Patient Active Problem List   Diagnosis Date Noted  . Major depressive disorder, recurrent severe without psychotic features (HCC) [F33.2] 06/15/2016  . Major depressive disorder, single episode, severe without psychosis (HCC) [F32.2] 06/15/2016  . Normal labor and delivery [O80] 11/23/2010   Total Time spent with patient: 25 minutes   Past Medical History:  Past Medical History:  Diagnosis Date  . Anxiety   . Bipolar 1 disorder (HCC)   . Costochondritis   . Depression   . History of kidney stones   . PONV (postoperative nausea and vomiting)     Past Surgical History:  Procedure Laterality Date  . APPENDECTOMY    . CHOLECYSTECTOMY    . LAPAROSCOPY N/A 04/16/2015   Procedure: LAPAROSCOPY DIAGNOSTIC;  Surgeon: Levi Aland, MD;  Location: [facility];  Service: Gynecology;  Laterality: N/A;   Family History:  Family History  Problem Relation Age of Onset  . Alcohol abuse Father   . Depression Father   . Mental illness Father   . Drug abuse Father   . Depression Mother   . Birth defects Sister   . Depression Maternal Grandmother   . Heart disease Maternal Grandmother   . Hypertension Maternal Grandmother   . Depression Maternal Grandfather   . Heart disease Maternal Grandfather   . Hypertension Maternal Grandfather   . Depression Paternal Grandmother   . Heart disease Paternal Grandmother   . Hypertension Paternal Grandmother   . Depression Paternal Grandfather   . Heart disease Paternal Grandfather   . Cancer Paternal Grandfather   . Alcohol abuse Paternal Grandfather   . Hypertension Paternal Grandfather    Social History:  History  Alcohol Use  . Yes     History  Drug Use No    Social History   Social History  . Marital status: Single    Spouse name: N/A  . Number of children: N/A  . Years of education: N/A   Social History Main Topics  . Smoking status: Never Smoker  . Smokeless tobacco: Never Used  . Alcohol use Yes  . Drug use: No  . Sexual activity: Yes    Birth control/ protection: None   Other Topics Concern  . None   Social History Narrative  . None   Additional Social History:    Pain Medications: See MAR  Prescriptions: See MAR Over the Counter: See MAR History of alcohol / drug use?:  Yes Name of Substance 1: Alcohol 1 - Age of First Use: unknown 1 - Frequency: "I have only drank 3 times in my life" 1 - Duration: since 23 1 - Last Use / Amount: yesterday  Sleep: Good  Appetite:  Good  Current Medications: Current Facility-Administered Medications  Medication Dose Route Frequency Provider Last Rate Last Dose  . acetaminophen (TYLENOL) tablet 650 mg  650 mg Oral Q6H PRN Charm RingsJamison Y Lord, NP   650 mg at 06/16/16 0806  . alum & mag hydroxide-simeth  (MAALOX/MYLANTA) 200-200-20 MG/5ML suspension 30 mL  30 mL Oral Q4H PRN Charm RingsJamison Y Lord, NP      . hydrOXYzine (ATARAX/VISTARIL) tablet 25 mg  25 mg Oral Q6H PRN Craige CottaFernando A Latika Kronick, MD   25 mg at 06/16/16 2011  . ibuprofen (ADVIL,MOTRIN) tablet 600 mg  600 mg Oral Q6H PRN Kerry HoughSpencer E Simon, PA-C   600 mg at 06/16/16 2011  . lamoTRIgine (LAMICTAL) tablet 25 mg  25 mg Oral Daily Rockey SituFernando A Marvyn Torrez, MD      . liothyronine (CYTOMEL) tablet 25 mcg  25 mcg Oral Daily Charm RingsJamison Y Lord, NP   25 mcg at 06/17/16 0755  . loperamide (IMODIUM) capsule 2-4 mg  2-4 mg Oral PRN Craige CottaFernando A Anokhi Shannon, MD      . LORazepam (ATIVAN) tablet 1 mg  1 mg Oral Q6H PRN Rockey SituFernando A Jarmarcus Wambold, MD      . magnesium hydroxide (MILK OF MAGNESIA) suspension 30 mL  30 mL Oral Daily PRN Charm RingsJamison Y Lord, NP      . mirtazapine (REMERON) tablet 7.5 mg  7.5 mg Oral QHS PRN Craige CottaFernando A Vaniya Augspurger, MD      . multivitamin with minerals tablet 1 tablet  1 tablet Oral Daily Craige CottaFernando A Briellah Baik, MD   1 tablet at 06/17/16 0755  . ondansetron (ZOFRAN-ODT) disintegrating tablet 4 mg  4 mg Oral Q6H PRN Craige CottaFernando A Emanual Lamountain, MD      . thiamine (VITAMIN B-1) tablet 100 mg  100 mg Oral Daily Craige CottaFernando A Taliesin Hartlage, MD   100 mg at 06/17/16 0755  . topiramate (TOPAMAX) tablet 50 mg  50 mg Oral BID Charm RingsJamison Y Lord, NP   50 mg at 06/17/16 0755  . venlafaxine XR (EFFEXOR-XR) 24 hr capsule 37.5 mg  37.5 mg Oral Q breakfast Rockey SituFernando A Luverne Zerkle, MD      . ziprasidone (GEODON) capsule 20 mg  20 mg Oral BID WC Craige CottaFernando A Tationa Stech, MD   20 mg at 06/17/16 [id number]    Lab Results:  Results for orders placed or performed during the hospital encounter of 06/15/16 (from the past 48 hour(s))  Lipid panel     Status: Abnormal   Collection Time: 06/17/16  6:09 AM  Result Value Ref Range   Cholesterol 118 0 - 200 mg/dL   Triglycerides 960100 <454<150 mg/dL   HDL 33 (L) >09>40 mg/dL   Total CHOL/HDL Ratio 3.6 RATIO   VLDL 20 0 - 40 mg/dL   LDL Cholesterol 65 0 - 99 mg/dL    Comment:        Total Cholesterol/HDL:CHD  Risk Coronary Heart Disease Risk Table                     Men   Women  1/2 Average Risk   3.4   3.3  Average Risk       5.0   4.4  2 X Average Risk   9.6   7.1  3 X  Average Risk  23.4   11.0        Use the calculated Patient Ratio above and the CHD Risk Table to determine the patient's CHD Risk.        ATP III CLASSIFICATION (LDL):  <100     mg/dL   Optimal  161-096  mg/dL   Near or Above                    Optimal  130-159  mg/dL   Borderline  045-409  mg/dL   High  >811     mg/dL   Very High Performed at [facility], [address] [place], [place] [address]   TSH     Status: None   Collection Time: 06/17/16  6:09 AM  Result Value Ref Range   TSH 1.367 0.350 - 4.500 uIU/mL    Comment: Performed by a 3rd Generation assay with a functional sensitivity of <=0.01 uIU/mL. Performed at [facility], [address], [place], [place] [address]     Blood Alcohol level:  Lab Results  Component Value Date   ETH <5 06/14/2016    Metabolic Disorder Labs: No results found for: HGBA1C, MPG No results found for: PROLACTIN Lab Results  Component Value Date   CHOL 118 06/17/2016   TRIG 100 06/17/2016   HDL 33 (L) 06/17/2016   CHOLHDL 3.6 06/17/2016   VLDL 20 06/17/2016   LDLCALC 65 06/17/2016    Physical Findings: AIMS: Facial and Oral Movements Muscles of Facial Expression: None, normal Lips and Perioral Area: None, normal Jaw: None, normal Tongue: None, normal,Extremity Movements Upper (arms, wrists, hands, fingers): None, normal Lower (legs, knees, ankles, toes): None, normal, Trunk Movements Neck, shoulders, hips: None, normal, Overall Severity Severity of abnormal movements (highest score from questions above): None, normal Incapacitation due to abnormal movements: None, normal Patient's awareness of abnormal movements (rate only patient's report): No Awareness, Dental Status Current problems with teeth and/or dentures?: No Does patient  usually wear dentures?: No  CIWA:  CIWA-Ar Total: 6 COWS:     Musculoskeletal: Strength & Muscle Tone: within normal limits Gait & Station: normal Patient leans: N/A  Psychiatric Specialty Exam: Physical Exam  ROS denies chest pain, no shortness of breath, no vomiting , no nausea, no rash  Blood pressure 112/65, pulse (!) 115, temperature 97.9 F (36.6 C), temperature source Oral, resp. rate 18, height 5\' 4"  (1.626 m), weight 69.4 kg (153 lb), last menstrual period 06/12/2016, SpO2 100 %, unknown if currently breastfeeding.Body mass index is 26.26 kg/m.  General Appearance: Fairly Groomed  Eye Contact:  Good  Speech:  Normal Rate  Volume:  Normal  Mood:  remains depressed, but partially improved   Affect:  constricted but more reactive   Thought Process:  Linear  Orientation:  Full (Time, Place, and Person)  Thought Content:  denies hallucinations, no delusions, not internally preoccupied obsessive ideations   Suicidal Thoughts:  No at this time denies any suicidal or self injurious ideations- contracts for safety on unit   Homicidal Thoughts:  No- at this time denies any homicidal or violent ideations  Memory:  recent and remote grossly intact   Judgement:  Other:  improved  Insight:  improved  Psychomotor Activity:  Normal  Concentration:  Concentration: Good and Attention Span: Good  Recall:  Good  Fund of Knowledge:  Good  Language:  Good  Akathisia:  Negative  Handed:  Right  AIMS (if indicated):  Assets:  Desire for Improvement Resilience  ADL's:  Intact  Cognition:  WNL  Sleep:  Number of Hours: 6.75   Assessment - patient remains depressed, but affect is more reactive today. Denies any SI and contracts for safety at the present time. She describes history of Bipolar Disorder, and is currently depressed, but is also endorsing significant anxiety symptoms, and describing OCD symptoms which she feels exacerbate her depression. We have discussed medication options-  although reporting history of bipolarity , we discussed potential benefits, versus risks, of adding an antidepressant medication to address OCD/anxiety . Patient agrees . Regarding low dose Cytomel, patient states she was started on it 2 months ago, but states " I don't know if I have hypothyroidism, I am not sure if there is any indication". TSH WNL .  Treatment Plan Summary: Daily contact with patient to assess and evaluate symptoms and progress in treatment, Medication management, Plan inpatient treatment and medications as below Encourage group and milieu participation to work on coping skills and symptom reduction Continue Geodon 20 mgrs BID for mood disorder Continue Topamax 50 mgrs BID for mood disorder Continue Remeron 7.5 mgrs QHS PRN for insomnia - of note, patient reports she does not tolerate Trazodone well . D/C Seroquel - patient concerned about sedation, weight gain effects  Start Effexor XR 37.5 mgrs QDAY for depression, anxiety , OCD symptoms Check FT3, FT4.  Treatment team working on disposition planning options.  Nehemiah Massed, MD 06/17/2016, 11:38 AM

## 2016-06-17 NOTE — Plan of Care (Signed)
Problem: Medication: Goal: Compliance with prescribed medication regimen will improve Outcome: Progressing Pt has been compliant with medication regimen tonight.   

## 2016-06-18 LAB — HEMOGLOBIN A1C
Hgb A1c MFr Bld: 4.7 % — ABNORMAL LOW (ref 4.8–5.6)
Mean Plasma Glucose: 88 mg/dL

## 2016-06-18 LAB — PROLACTIN: Prolactin: 60.7 ng/mL — ABNORMAL HIGH (ref 4.8–23.3)

## 2016-06-18 LAB — T4, FREE: FREE T4: 0.55 ng/dL — AB (ref 0.61–1.12)

## 2016-06-18 MED ORDER — ARIPIPRAZOLE 5 MG PO TABS
5.0000 mg | ORAL_TABLET | Freq: Every day | ORAL | Status: DC
  Administered 2016-06-18: 5 mg via ORAL
  Filled 2016-06-18 (×4): qty 1

## 2016-06-18 NOTE — Progress Notes (Signed)
D: Pt denies suicidal thoughts. Pt rates depression 8/10. Anxiety 9/10. Hopeless 8/10. Pt reports fair sleep. Low energy level. Pt goal for today is to work on coping skills and discharge plans. Writer administered pt new order of Abilify and provided education. A: Medications reviewed with pt. Medications administered as ordered per MD. Verbal support provided. 15 minute checks performed for safety. R: Pt compliant with tx.

## 2016-06-18 NOTE — Progress Notes (Signed)
D: Patient states that her headache has improved.  Patient suspects that the geodon may be causing side effects.  Patient had reported yesterday afternoon that she was having a "stabbing pain" and she attributed it to the geodon.  Patient has been anxious, however, her affect is brighter.  She denies any thoughts of self harm. A: Continue to monitor medication management and MD orders.  Safety checks completed every 15 minutes per protocol. Offer support and encouragement as needed. R: Patient is receptive to staff; her behavior is appropriate.

## 2016-06-18 NOTE — Progress Notes (Signed)
Adult Psychoeducational Group Note  Date:  06/18/2016 Time:  9:05 PM  Group Topic/Focus:  Wrap-Up Group:   The focus of this group is to help patients review their daily goal of treatment and discuss progress on daily workbooks.  Participation Level:  Active  Participation Quality:  Appropriate  Affect:  Appropriate  Cognitive:  Alert  Insight: Appropriate  Engagement in Group:  Engaged  Modes of Intervention:  Discussion  Additional Comments:  Pt rated her day 7/10. Her goal was to learn some coping skills and was able to accomplish that. She also enjoyed the speaker from the mental health association.   Kaleen OdeaCOOKE, Nera Haworth R 06/18/2016, 9:05 PM

## 2016-06-18 NOTE — Progress Notes (Addendum)
[facility] MD Progress Note  06/18/2016 11:14 AM [name]  MRN:  [id number] Subjective: patient reports partial improvement compared to admission , but reports ongoing depression, anxiety. She denies suicidal ideations. She states she has developed headaches, and suspects it is related to Geodon, because headache onset coincides with Geodon trial. States she wants to change this medication. Continues to describe , in addition to mood disorder, OCD symptoms, suggestive of comorbid OCD- reports she has frequent ego dystonic ruminations, which are sometimes intrusive ideations of pushing or hurting someone, which she states " I would never do, I am not like that , I am not violent ". She " talks back to myself in my mind" or at times takes " three steps " as a compulsion to address the obsessions .  Objective : I have discussed case with treatment team. Patient improving compared to admission- less severely depressed, affect still constricted but improving . Affect improves during session, is more reactive than on admission. Denies suicidal or self injurious ideations. As above , currently reports headaches, as side effect to Geodon, which she wants to change to another medication. No disruptive or agitated behaviors on unit. She is more visible on unit and is going to groups, participating more. Side effects reviewed, including risk of  Movement disorders and metabolic disturbances on antipsychotic medications, potential Viviann Spare Jonhson Syndrome on Lamictal.  Principal Problem: Major depressive disorder, recurrent severe without psychotic features (HCC) Diagnosis:   Patient Active Problem List   Diagnosis Date Noted  . Major depressive disorder, recurrent severe without psychotic features (HCC) [F33.2] 06/15/2016  . Major depressive disorder, single episode, severe without psychosis (HCC) [F32.2] 06/15/2016  . Normal labor and delivery [O80] 11/23/2010   Total Time spent with patient:20 minutes    Past Medical History:  Past Medical History:  Diagnosis Date  . Anxiety   . Bipolar 1 disorder (HCC)   . Costochondritis   . Depression   . History of kidney stones   . PONV (postoperative nausea and vomiting)     Past Surgical History:  Procedure Laterality Date  . APPENDECTOMY    . CHOLECYSTECTOMY    . LAPAROSCOPY N/A 04/16/2015   Procedure: LAPAROSCOPY DIAGNOSTIC;  Surgeon: Levi Aland, MD;  Location: [facility];  Service: Gynecology;  Laterality: N/A;   Family History:  Family History  Problem Relation Age of Onset  . Alcohol abuse Father   . Depression Father   . Mental illness Father   . Drug abuse Father   . Depression Mother   . Birth defects Sister   . Depression Maternal Grandmother   . Heart disease Maternal Grandmother   . Hypertension Maternal Grandmother   . Depression Maternal Grandfather   . Heart disease Maternal Grandfather   . Hypertension Maternal Grandfather   . Depression Paternal Grandmother   . Heart disease Paternal Grandmother   . Hypertension Paternal Grandmother   . Depression Paternal Grandfather   . Heart disease Paternal Grandfather   . Cancer Paternal Grandfather   . Alcohol abuse Paternal Grandfather   . Hypertension Paternal Grandfather    Social History:  History  Alcohol Use  . Yes     History  Drug Use No    Social History   Social History  . Marital status: Single    Spouse name: N/A  . Number of children: N/A  . Years of education: N/A   Social History Main Topics  . Smoking status: Never Smoker  . Smokeless tobacco:  Never Used  . Alcohol use Yes  . Drug use: No  . Sexual activity: Yes    Birth control/ protection: None   Other Topics Concern  . None   Social History Narrative  . None   Additional Social History:    Pain Medications: See MAR  Prescriptions: See MAR Over the Counter: See MAR History of alcohol / drug use?: Yes Name of Substance 1: Alcohol 1 - Age of First Use: unknown 1 -  Frequency: "I have only drank 3 times in my life" 1 - Duration: since 23 1 - Last Use / Amount: yesterday  Sleep: Good  Appetite:  Good  Current Medications: Current Facility-Administered Medications  Medication Dose Route Frequency Provider Last Rate Last Dose  . acetaminophen (TYLENOL) tablet 650 mg  650 mg Oral Q6H PRN Charm Rings, NP   650 mg at 06/17/16 2119  . alum & mag hydroxide-simeth (MAALOX/MYLANTA) 200-200-20 MG/5ML suspension 30 mL  30 mL Oral Q4H PRN Charm Rings, NP      . dextromethorphan-guaiFENesin [facility] DM) 30-600 MG per 12 hr tablet 1 tablet  1 tablet Oral BID Adonis Brook, NP   1 tablet at 06/18/16 [id number]  . hydrOXYzine (ATARAX/VISTARIL) tablet 25 mg  25 mg Oral Q6H PRN Craige Cotta, MD   25 mg at 06/17/16 1946  . ibuprofen (ADVIL,MOTRIN) tablet 600 mg  600 mg Oral Q6H PRN Kerry Hough, PA-C   600 mg at 06/17/16 1946  . lamoTRIgine (LAMICTAL) tablet 25 mg  25 mg Oral Daily Craige Cotta, MD   25 mg at 06/18/16 0810  . liothyronine (CYTOMEL) tablet 25 mcg  25 mcg Oral Daily Charm Rings, NP   25 mcg at 06/18/16 0809  . loperamide (IMODIUM) capsule 2-4 mg  2-4 mg Oral PRN Craige Cotta, MD      . LORazepam (ATIVAN) tablet 1 mg  1 mg Oral Q6H PRN Rockey Situ Cobos, MD      . magnesium hydroxide (MILK OF MAGNESIA) suspension 30 mL  30 mL Oral Daily PRN Charm Rings, NP      . mirtazapine (REMERON) tablet 7.5 mg  7.5 mg Oral QHS PRN Craige Cotta, MD      . multivitamin with minerals tablet 1 tablet  1 tablet Oral Daily Craige Cotta, MD   1 tablet at 06/18/16 0809  . ondansetron (ZOFRAN-ODT) disintegrating tablet 4 mg  4 mg Oral Q6H PRN Craige Cotta, MD      . thiamine (VITAMIN B-1) tablet 100 mg  100 mg Oral Daily Craige Cotta, MD   100 mg at 06/18/16 0809  . topiramate (TOPAMAX) tablet 50 mg  50 mg Oral BID Charm Rings, NP   50 mg at 06/18/16 0809  . venlafaxine XR (EFFEXOR-XR) 24 hr capsule 37.5 mg  37.5 mg Oral Q breakfast  Craige Cotta, MD   37.5 mg at 06/18/16 0808  . ziprasidone (GEODON) capsule 20 mg  20 mg Oral BID WC Craige Cotta, MD   Stopped at 06/18/16 [phone]    Lab Results:  Results for orders placed or performed during the hospital encounter of 06/15/16 (from the past 48 hour(s))  Hemoglobin A1c     Status: Abnormal   Collection Time: 06/17/16  6:09 AM  Result Value Ref Range   Hgb A1c MFr Bld 4.7 (L) 4.8 - 5.6 %    Comment: (NOTE)  Pre-diabetes: 5.7 - 6.4         Diabetes: >6.4         Glycemic control for adults with diabetes: <7.0    Mean Plasma Glucose 88 mg/dL    Comment: (NOTE) Performed At: [facility] [address] [place], [place] [id number] Mila Homer MD [id number] Performed at [facility], [address], [place], [place] [address]   Lipid panel     Status: Abnormal   Collection Time: 06/17/16  6:09 AM  Result Value Ref Range   Cholesterol 118 0 - 200 mg/dL   Triglycerides 308 <657 mg/dL   HDL 33 (L) >84 mg/dL   Total CHOL/HDL Ratio 3.6 RATIO   VLDL 20 0 - 40 mg/dL   LDL Cholesterol 65 0 - 99 mg/dL    Comment:        Total Cholesterol/HDL:CHD Risk Coronary Heart Disease Risk Table                     Men   Women  1/2 Average Risk   3.4   3.3  Average Risk       5.0   4.4  2 X Average Risk   9.6   7.1  3 X Average Risk  23.4   11.0        Use the calculated Patient Ratio above and the CHD Risk Table to determine the patient's CHD Risk.        ATP III CLASSIFICATION (LDL):  <100     mg/dL   Optimal  696-295  mg/dL   Near or Above                    Optimal  130-159  mg/dL   Borderline  284-132  mg/dL   High  >440     mg/dL   Very High Performed at [facility], [address], [place], [place] [address]   Prolactin     Status: Abnormal   Collection Time: 06/17/16  6:09 AM  Result Value Ref Range   Prolactin 60.7 (H) 4.8 - 23.3 ng/mL    Comment: (NOTE) Performed At: [facility] [address] [place], [place] [id number] Mila Homer MD [id number] Performed at [facility], [address], [place], [place] [address]   TSH     Status: None   Collection Time: 06/17/16  6:09 AM  Result Value Ref Range   TSH 1.367 0.350 - 4.500 uIU/mL    Comment: Performed by a 3rd Generation assay with a functional sensitivity of <=0.01 uIU/mL. Performed at [facility], [address], [place], [place] [address]   T4, free     Status: Abnormal   Collection Time: 06/18/16  6:10 AM  Result Value Ref Range   Free T4 0.55 (L) 0.61 - 1.12 ng/dL    Comment: (NOTE) Biotin ingestion may interfere with free T4 tests. If the results are inconsistent with the TSH level, previous test results, or the clinical presentation, then consider biotin interference. If needed, order repeat testing after stopping biotin. Performed at [facility], [address], [place], [place] [address]     Blood Alcohol level:  Lab Results  Component Value Date   Comprehensive Outpatient Surge <5 06/14/2016    Metabolic Disorder Labs: Lab Results  Component Value Date   HGBA1C 4.7 (L) 06/17/2016   MPG 88 06/17/2016   Lab Results  Component Value  Date   PROLACTIN 60.7 (H) 06/17/2016   Lab Results  Component Value Date   CHOL 118 06/17/2016   TRIG 100 06/17/2016   HDL 33 (L) 06/17/2016   CHOLHDL 3.6 06/17/2016   VLDL 20 06/17/2016   LDLCALC 65 06/17/2016    Physical Findings: AIMS: Facial and Oral Movements Muscles of Facial Expression: None, normal Lips and Perioral Area: None, normal Jaw: None, normal Tongue: None, normal,Extremity Movements Upper (arms, wrists, hands, fingers): None, normal Lower (legs, knees, ankles, toes): None, normal, Trunk Movements Neck, shoulders, hips: None, normal, Overall Severity Severity of abnormal movements (highest score from questions above): None, normal Incapacitation due to abnormal movements: None, normal Patient's awareness of  abnormal movements (rate only patient's report): No Awareness, Dental Status Current problems with teeth and/or dentures?: No Does patient usually wear dentures?: No  CIWA:  CIWA-Ar Total: 0 COWS:     Musculoskeletal: Strength & Muscle Tone: within normal limits Gait & Station: normal Patient leans: N/A  Psychiatric Specialty Exam: Physical Exam  ROS denies chest pain, no shortness of breath, no vomiting , no nausea, no rash  Blood pressure 112/67, pulse 93, temperature 98.3 F (36.8 C), temperature source Oral, resp. rate 18, height 5\' 4"  (1.626 m), weight 69.4 kg (153 lb), last menstrual period 06/12/2016, SpO2 100 %, unknown if currently breastfeeding.Body mass index is 26.26 kg/m.  General Appearance: improved grooming   Eye Contact:  Good  Speech:  Normal Rate  Volume:  Normal  Mood:  Depressed, but some improvement compared to admission  Affect:  Anxious, constricted, reactive, improves during session  Thought Process:  Linear  Orientation:  Full (Time, Place, and Person)  Thought Content:  denies hallucinations, no delusions, not internally preoccupied reports history of obsessive ideations, improving since admission- see above    Suicidal Thoughts:  No at this time denies any suicidal or self injurious ideations- contracts for safety on unit   Homicidal Thoughts:  No- at this time denies any homicidal or violent plans or intentions  Memory:  recent and remote grossly intact   Judgement:  Other:  improved  Insight:  improved  Psychomotor Activity:  Normal  Concentration:  Concentration: Good and Attention Span: Good  Recall:  Good  Fund of Knowledge:  Good  Language:  Good  Akathisia:  Negative  Handed:  Right  AIMS (if indicated):     Assets:  Desire for Improvement Resilience  ADL's:  Intact  Cognition:  WNL  Sleep:  Number of Hours: 6.75   Assessment - remains depressed, but reports some improvement and affect is somewhat more reactive . Denies suicidal  ideations at this time. Describes symptoms suggestive of OCD in addition to her mood symptomatology, depression. Has not tolerated Geodon well due to side effects ( headache). We discussed options, states Abilify was helpful a few years ago, had to stop it not due to side effects but due to cost, but as now available generic, less expensive, is interested in trying it again.  Treatment Plan Summary: Daily contact with patient to assess and evaluate symptoms and progress in treatment, Medication management, Plan inpatient treatment and medications as below Encourage group and milieu participation to work on coping skills and symptom reduction D/C Geodon due to side effects- see above Start Abilify  5 mgrs QDAY for mood disorder  Continue Topamax 50 mgrs BID for mood disorder Continue Lamictal 25 mgr QDAY for mood disorder, depression Continue Remeron 7.5 mgrs QHS PRN for insomnia Continue Effexor XR 37.5  mgrs QDAY for depression, anxiety , OCD symptoms Treatment team working on disposition planning options.  Nehemiah Massed, MD 06/18/2016, 11:14 AM   Patient ID: [name], female   DOB: July 10, 1990, 26 y.o.   MRN: [id number]

## 2016-06-18 NOTE — [facility] Group Notes (Signed)
[facility] Group Therapy 06/18/2016 1:15pm  Type of Therapy: [employer] Presentation  Participation Level: Active  Participation Quality: Attentive  Affect: Appropriate  Cognitive: Oriented  Insight: Developing/Improving  Engagement in Therapy: Engaged  Modes of Intervention: Discussion, Education and Socialization  Summary of Progress/Problems: [employer] (MHA) Speaker came to talk about his personal journey with substance abuse and addiction. The pt processed ways by which to relate to the speaker. MHA speaker provided handouts and educational information pertaining to groups and services offered by the MHA. Pt was engaged in speaker's presentation and was receptive to resources provided.    Katrese Shell, LCSW 06/18/2016 1:21 PM  

## 2016-06-19 DIAGNOSIS — R45851 Suicidal ideations: Secondary | ICD-10-CM

## 2016-06-19 LAB — T3, FREE: T3, Free: 3.9 pg/mL (ref 2.0–4.4)

## 2016-06-19 MED ORDER — QUETIAPINE FUMARATE 50 MG PO TABS
50.0000 mg | ORAL_TABLET | Freq: Three times a day (TID) | ORAL | Status: DC | PRN
  Administered 2016-06-19 – 2016-06-21 (×6): 50 mg via ORAL
  Filled 2016-06-19 (×6): qty 1

## 2016-06-19 MED ORDER — DOXEPIN HCL 10 MG PO CAPS
10.0000 mg | ORAL_CAPSULE | Freq: Every day | ORAL | Status: DC
  Administered 2016-06-19 – 2016-06-20 (×2): 10 mg via ORAL
  Filled 2016-06-19 (×3): qty 1

## 2016-06-19 MED ORDER — HYDROXYZINE HCL 25 MG PO TABS
25.0000 mg | ORAL_TABLET | Freq: Four times a day (QID) | ORAL | Status: DC | PRN
  Administered 2016-06-19 – 2016-06-22 (×5): 25 mg via ORAL
  Filled 2016-06-19 (×4): qty 1

## 2016-06-19 NOTE — Tx Team (Signed)
Interdisciplinary Treatment and Diagnostic Plan Update  06/19/2016 Time of Session: 12:28 PM  Haywood PaoKaitlyn L Boxell MRN: [id number]  Principal Diagnosis: Major depressive disorder, recurrent severe without psychotic features (HCC)  Secondary Diagnoses: Principal Problem:   Major depressive disorder, recurrent severe without psychotic features (HCC) Active Problems:   Major depressive disorder, single episode, severe without psychosis (HCC)   Current Medications:  Current Facility-Administered Medications  Medication Dose Route Frequency Provider Last Rate Last Dose  . acetaminophen (TYLENOL) tablet 650 mg  650 mg Oral Q6H PRN Charm RingsJamison Y Lord, NP   650 mg at 06/17/16 2119  . alum & mag hydroxide-simeth (MAALOX/MYLANTA) 200-200-20 MG/5ML suspension 30 mL  30 mL Oral Q4H PRN Charm RingsJamison Y Lord, NP      . dextromethorphan-guaiFENesin [facility](MUCINEX DM) 30-600 MG per 12 hr tablet 1 tablet  1 tablet Oral BID Adonis BrookSheila Agustin, NP   1 tablet at 06/19/16 0000  . doxepin (SINEQUAN) capsule 10 mg  10 mg Oral QHS Saramma Eappen, MD      . ibuprofen (ADVIL,MOTRIN) tablet 600 mg  600 mg Oral Q6H PRN Kerry HoughSpencer E Simon, PA-C   600 mg at 06/18/16 1449  . lamoTRIgine (LAMICTAL) tablet 25 mg  25 mg Oral Daily Craige CottaFernando A Cobos, MD   25 mg at 06/19/16 1145  . liothyronine (CYTOMEL) tablet 25 mcg  25 mcg Oral Daily Charm RingsJamison Y Lord, NP   25 mcg at 06/19/16 0800  . magnesium hydroxide (MILK OF MAGNESIA) suspension 30 mL  30 mL Oral Daily PRN Charm RingsJamison Y Lord, NP      . multivitamin with minerals tablet 1 tablet  1 tablet Oral Daily Craige CottaFernando A Cobos, MD   1 tablet at 06/19/16 0801  . QUEtiapine (SEROQUEL) tablet 50 mg  50 mg Oral TID PRN Jomarie LongsSaramma Eappen, MD   50 mg at 06/19/16 1142  . thiamine (VITAMIN B-1) tablet 100 mg  100 mg Oral Daily Craige CottaFernando A Cobos, MD   100 mg at 06/19/16 0801  . topiramate (TOPAMAX) tablet 50 mg  50 mg Oral BID Charm RingsJamison Y Lord, NP   50 mg at 06/19/16 0800  . venlafaxine XR (EFFEXOR-XR) 24 hr capsule 37.5 mg   37.5 mg Oral Q breakfast Craige CottaFernando A Cobos, MD   37.5 mg at 06/19/16 1144    PTA Medications: Prescriptions Prior to Admission  Medication Sig Dispense Refill Last Dose  . liothyronine (CYTOMEL) 25 MCG tablet Take 25 mcg by mouth daily.   06/13/2016 at Unknown time  . Multiple Vitamins-Minerals (MULTIVITAMIN GUMMIES ADULT) CHEW Chew 1 each by mouth 2 (two) times daily.   06/13/2016 at Unknown time  . QUEtiapine (SEROQUEL) 200 MG tablet Take 200 mg by mouth at bedtime.   06/13/2016 at Unknown time  . topiramate (TOPAMAX) 50 MG tablet Take 50 mg by mouth 2 (two) times daily.   06/13/2016 at Unknown time    Treatment Modalities: Medication Management, Group therapy, Case management,  1 to 1 session with clinician, Psychoeducation, Recreational therapy.  Patient Stressors: Financial difficulties Medication change or noncompliance  Patient Strengths: DentistCommunication skills General fund of knowledge Motivation for treatment/growth Physical Health Supportive family/friends  Physician Treatment Plan for Primary Diagnosis: Major depressive disorder, recurrent severe without psychotic features (HCC) Long Term Goal(s): Improvement in symptoms so as ready for discharge  Short Term Goals: Ability to verbalize feelings will improve Ability to disclose and discuss suicidal ideas Ability to demonstrate self-control will improve Ability to identify and develop effective coping behaviors will improve Ability to maintain  clinical measurements within normal limits will improve Ability to verbalize feelings will improve Ability to disclose and discuss suicidal ideas Ability to demonstrate self-control will improve Ability to identify and develop effective coping behaviors will improve  Medication Management: Evaluate patient's response, side effects, and tolerance of medication regimen.  Therapeutic Interventions: 1 to 1 sessions, Unit Group sessions and Medication administration.  Evaluation of Outcomes:  Progressing  Physician Treatment Plan for Secondary Diagnosis: Principal Problem:   Major depressive disorder, recurrent severe without psychotic features (HCC) Active Problems:   Major depressive disorder, single episode, severe without psychosis (HCC)   Long Term Goal(s): Improvement in symptoms so as ready for discharge  Short Term Goals: Ability to verbalize feelings will improve Ability to disclose and discuss suicidal ideas Ability to demonstrate self-control will improve Ability to identify and develop effective coping behaviors will improve Ability to maintain clinical measurements within normal limits will improve Ability to verbalize feelings will improve Ability to disclose and discuss suicidal ideas Ability to demonstrate self-control will improve Ability to identify and develop effective coping behaviors will improve  Medication Management: Evaluate patient's response, side effects, and tolerance of medication regimen.  Therapeutic Interventions: 1 to 1 sessions, Unit Group sessions and Medication administration.  Evaluation of Outcomes: Progressing   RN Treatment Plan for Primary Diagnosis: Major depressive disorder, recurrent severe without psychotic features (HCC) Long Term Goal(s): Knowledge of disease and therapeutic regimen to maintain health will improve  Short Term Goals: Ability to verbalize feelings will improve, Ability to disclose and discuss suicidal ideas and Ability to identify and develop effective coping behaviors will improve  Medication Management: RN will administer medications as ordered by provider, will assess and evaluate patient's response and provide education to patient for prescribed medication. RN will report any adverse and/or side effects to prescribing provider.  Therapeutic Interventions: 1 on 1 counseling sessions, Psychoeducation, Medication administration, Evaluate responses to treatment, Monitor vital signs and CBGs as ordered,  Perform/monitor CIWA, COWS, AIMS and Fall Risk screenings as ordered, Perform wound care treatments as ordered.  Evaluation of Outcomes: Progressing   LCSW Treatment Plan for Primary Diagnosis: Major depressive disorder, recurrent severe without psychotic features (HCC) Long Term Goal(s): Safe transition to appropriate next level of care at discharge, Engage patient in therapeutic group addressing interpersonal concerns.  Short Term Goals: Engage patient in aftercare planning with referrals and resources, Identify triggers associated with mental health/substance abuse issues and Increase skills for wellness and recovery  Therapeutic Interventions: Assess for all discharge needs, 1 to 1 time with Social worker, Explore available resources and support systems, Assess for adequacy in community support network, Educate family and significant other(s) on suicide prevention, Complete Psychosocial Assessment, Interpersonal group therapy.  Evaluation of Outcomes: Progressing   Progress in Treatment: Attending groups: Yes Participating in groups: Minimally Taking medication as prescribed: Yes, MD continues to assess for medication changes as needed Toleration medication: Yes, no side effects reported at this time Family/Significant other contact made: No, Pt declines Patient understands diagnosis: Developing insight Discussing patient identified problems/goals with staff: Yes Medical problems stabilized or resolved: Yes Denies suicidal/homicidal ideation: Yes Issues/concerns per patient self-inventory: None Other: N/A  New problem(s) identified: None identified at this time.   New Short Term/Long Term Goal(s): None identified at this time.   Discharge Plan or Barriers: Pt will return home and follow-up with outpatient services.   Reason for Continuation of Hospitalization: Anxiety Depression Medication stabilization Suicidal ideation  Estimated Length of Stay: 2-3  days  Attendees:  Patient: 06/19/2016  12:28 PM  Physician: Dr. Jola Babinski; Dr. Elna Breslow 06/19/2016  12:28 PM  Nursing: Lissa Hoard., RN 06/19/2016  12:28 PM  RN Care Manager: Onnie Boer, RN 06/19/2016  12:28 PM  Social Worker: Vernie Shanks, LCSW; Heather Smart, LCSW 06/19/2016  12:28 PM  Recreational Therapist:  06/19/2016  12:28 PM  Other: Armandina Stammer, NP 06/19/2016  12:28 PM  Other:  06/19/2016  12:28 PM  Other: 06/19/2016  12:28 PM    Scribe for Treatment Team: Verdene Lennert, LCSW 06/19/2016 12:28 PM

## 2016-06-19 NOTE — Progress Notes (Signed)
D: Pt visible in hall at this time. Presented anxious with flat affect on initial contact this AM. Denies SI, HI, AVH and pain. Endorsed severe anxiety, was tearful, reported "hearing voices in my room last night after taking those medications (Abilify, Lamictal and Effexor)".  A: Scheduled and PRN medications administered as prescribed with verbal education. Dr. Elna BreslowEappen made aware of pt's complaints. AM medications given per Dr. Elna BreslowEappen, post MD's assessment. Continued support and availability provided to pt. Encouraged pt to attend groups, verbalized needs and comply with medication regimen as ordered. Q 15 minutes observation maintained without incidents. R: Pt receptive to care. Compliant with medications, denies adverse drug reactions. Pt did not attend group this AM but attended noon groups. Off unit for meals with peers and returned without issues. Remains safe on and off unit. POC continues for mood stability.

## 2016-06-19 NOTE — Progress Notes (Signed)
D: Pt at the time of assessment was alert and oriented x 4. Pt complained of severe anxiety and moderate depression; states, I don't know what is happening to me; it feels like my body is trying to get out of my skin; I was started on three new medications and I feeling like one of them is causing this feeling. Pt denied anxiety, pain, SI, HI or AVH. Pt was very restless-Pt was seen pacing the hallway. A: Medications offered as prescribed.  Support, encouragement, and safe environment provided.  15-minute safety checks continue.  R: Pt was med compliant.  Pt attended group. Safety checks continue

## 2016-06-19 NOTE — Progress Notes (Signed)
Adult Psychoeducational Group Note  Date:  06/19/2016 Time:  10:49 AM  Group Topic/Focus:  Goals Group:   The focus of this group is to help patients establish daily goals to achieve during treatment and discuss how the patient can incorporate goal setting into their daily lives to aide in recovery.  Participation Level:  Active  Participation Quality:  Appropriate  Affect:  Appropriate  Cognitive:  Appropriate  Insight: Appropriate  Engagement in Group:  Engaged  Modes of Intervention:  Discussion  Additional Comments:  Pt stated she was feeling anxious.  Pt stated she will go to her outpatient appointments and use her coping skills in order to keep from relapsing.  [name], [name] 06/19/2016, 10:49 AM

## 2016-06-19 NOTE — Progress Notes (Signed)
Recreation Therapy Notes  Date: 06/19/16 Time: 0930 Location: [address] Dayroom  Group Topic: Stress Management  Goal Area(s) Addresses:  Patient will verbalize importance of using healthy stress management.  Patient will identify positive emotions associated with healthy stress management.   Intervention: Stress Management  Activity :  Peaceful Place.  LRT introduced the stress management technique of guided imagery.  LRT read a script to allow patients to engage in a "mental vacation".  Patients were to follow along as LRT read script.  Education:  Stress Management, Discharge Planning.   Education Outcome: Acknowledges edcuation/In group clarification offered/Needs additional education  Clinical Observations/Feedback: Pt did not attend group.   Caroll RancherMarjette Jilliam Bellmore, LRT/CTRS         Caroll RancherLindsay, Miral Hoopes A 06/19/2016 12:13 PM

## 2016-06-19 NOTE — Plan of Care (Signed)
Problem: Coping: Goal: Ability to verbalize frustrations and anger appropriately will improve Outcome: Progressing Patient able to appropriately make needs known to writer and verbalizes frustration with medication changes and side effects.  Problem: Safety: Goal: Periods of time without injury will increase Outcome: Progressing Patient denies active SI and contracts for safety on the unit. Patient is on q15 min safety checks. Patient remains safe on the unit.

## 2016-06-19 NOTE — [facility] Group Notes (Signed)
[facility] LCSW Group Therapy 06/19/2016 1:15pm  Type of Therapy: Group Therapy- Feelings Around Relapse and Recovery  Participation Level: Minimal  Participation Quality:  N/A  Affect:  Flat  Cognitive: Alert and Oriented   Insight:  Unable to assess  Engagement in Therapy: Developing/Improving and Engaged   Modes of Intervention: Clarification, Confrontation, Discussion, Education, Exploration, Limit-setting, Orientation, Problem-solving, Rapport Building, Dance movement psychotherapisteality Testing, Socialization and Support  Summary of Progress/Problems: The topic for today was feelings about relapse. The group discussed what relapse prevention is to them and identified triggers that they are on the path to relapse. Members also processed their feeling towards relapse and were able to relate to common experiences. Group also discussed coping skills that can be used for relapse prevention.  Pt did not participate in group discussion.   Therapeutic Modalities:   Cognitive Behavioral Therapy Solution-Focused Therapy Assertiveness Training Relapse Prevention Therapy    Vernie ShanksLauren Terrius Gentile, LCSW [phone] 06/19/2016 2:10 PM

## 2016-06-19 NOTE — Progress Notes (Signed)
Nursing Progress Note 7p-7a  D) Patient presents flat, depressed and anxious. Patient is observed in the dayroom interacting with peers. Patient reports that she "still feels messed up" from the medications yesterday. Patient states "maybe I need to stop taking the Lamictal". Patient states "I need to get good sleep". Patient reports poor sleep last night. Patient denies SI/HI/AVH or pain. Patient complains of anxiety. Patient contracts for safety at this time.  A) Emotional support given. Patient medicated with PM orders as prescribed. Medications reviewed with patient. Patient on q15 min safety checks. Opportunities for questions or concerns presented to patient. Patient encouraged to continue to work on treatment goals. Patient encouraged to speak with provider regarding medications and to make her needs known.   R) Patient receptive to interaction with nurse. Patient remains safe on the unit at this time. Patient is resting in bed without complaints. Will continue to monitor.

## 2016-06-19 NOTE — Progress Notes (Signed)
[facility] MD Progress Note  06/19/2016 12:56 PM [name]  MRN:  [id number] Subjective: Patient states " I am not sure what is causing it , I was started on several new medications at the same time. I feel foggy , I am having trouble remembering things , I am having SI as well as violent thoughts that I cannot get out of my head. I did not sleep at all last night. I never had hallucinations and last night I was hearing things, I am having anxiety attacks and I feel worse."  Objective :Patient seen and chart reviewed.Discussed patient with treatment team.  I have reviewed notes in EHR per Dr.Cobos. Pt today seen as distressed , anxious , reports worsening sadness, anxiety sx as well as poor sleep last night. Pt was started on Abilify , she feels she started hallucinating out of nowhere as well as had panic attacks back to back. Unknown if her medications are causing it or her withdrawing from her previous medications. Discussed that Abilify may be discontinued today and she could monitor self as to how she feels. Pt also was on seroquel which was discontinued on admission, hence discussed that she can be started on PRN seroquel , she can slowly taper self off. Pt agrees with plan.      Principal Problem: Major depressive disorder, recurrent severe without psychotic features (HCC) Diagnosis:   Patient Active Problem List   Diagnosis Date Noted  . Major depressive disorder, recurrent severe without psychotic features (HCC) [F33.2] 06/15/2016  . Major depressive disorder, single episode, severe without psychosis (HCC) [F32.2] 06/15/2016  . Normal labor and delivery [O80] 11/23/2010   Total Time spent with patient:25  minutes   Past psychiatric hx: Please see H&P.   Past Medical History:  Past Medical History:  Diagnosis Date  . Anxiety   . Bipolar 1 disorder (HCC)   . Costochondritis   . Depression   . History of kidney stones   . PONV (postoperative nausea and vomiting)      Past Surgical History:  Procedure Laterality Date  . APPENDECTOMY    . CHOLECYSTECTOMY    . LAPAROSCOPY N/A 04/16/2015   Procedure: LAPAROSCOPY DIAGNOSTIC;  Surgeon: Levi Aland, MD;  Location: [facility];  Service: Gynecology;  Laterality: N/A;   Family History: Please see H&P.  Family History  Problem Relation Age of Onset  . Alcohol abuse Father   . Depression Father   . Mental illness Father   . Drug abuse Father   . Depression Mother   . Birth defects Sister   . Depression Maternal Grandmother   . Heart disease Maternal Grandmother   . Hypertension Maternal Grandmother   . Depression Maternal Grandfather   . Heart disease Maternal Grandfather   . Hypertension Maternal Grandfather   . Depression Paternal Grandmother   . Heart disease Paternal Grandmother   . Hypertension Paternal Grandmother   . Depression Paternal Grandfather   . Heart disease Paternal Grandfather   . Cancer Paternal Grandfather   . Alcohol abuse Paternal Grandfather   . Hypertension Paternal Grandfather    Social History: Please see H&P.  History  Alcohol Use  . Yes     History  Drug Use No    Social History   Social History  . Marital status: Single    Spouse name: N/A  . Number of children: N/A  . Years of education: N/A   Social History Main Topics  . Smoking status: Never Smoker  .  Smokeless tobacco: Never Used  . Alcohol use Yes  . Drug use: No  . Sexual activity: Yes    Birth control/ protection: None   Other Topics Concern  . None   Social History Narrative  . None   Additional Social History:    Pain Medications: See MAR  Prescriptions: See MAR Over the Counter: See MAR History of alcohol / drug use?: Yes Name of Substance 1: Alcohol 1 - Age of First Use: unknown 1 - Frequency: "I have only drank 3 times in my life" 1 - Duration: since 23 1 - Last Use / Amount: yesterday  Sleep: Poor  Appetite:  Fair  Current Medications: Current Facility-Administered  Medications  Medication Dose Route Frequency Provider Last Rate Last Dose  . acetaminophen (TYLENOL) tablet 650 mg  650 mg Oral Q6H PRN Charm RingsJamison Y Lord, NP   650 mg at 06/17/16 2119  . alum & mag hydroxide-simeth (MAALOX/MYLANTA) 200-200-20 MG/5ML suspension 30 mL  30 mL Oral Q4H PRN Charm RingsJamison Y Lord, NP      . dextromethorphan-guaiFENesin [facility](MUCINEX DM) 30-600 MG per 12 hr tablet 1 tablet  1 tablet Oral BID Adonis BrookSheila Agustin, NP   1 tablet at 06/19/16 0000  . doxepin (SINEQUAN) capsule 10 mg  10 mg Oral QHS Yaris Ferrell, MD      . ibuprofen (ADVIL,MOTRIN) tablet 600 mg  600 mg Oral Q6H PRN Kerry HoughSpencer E Simon, PA-C   600 mg at 06/18/16 1449  . lamoTRIgine (LAMICTAL) tablet 25 mg  25 mg Oral Daily Craige CottaFernando A Cobos, MD   25 mg at 06/19/16 1145  . liothyronine (CYTOMEL) tablet 25 mcg  25 mcg Oral Daily Charm RingsJamison Y Lord, NP   25 mcg at 06/19/16 0800  . magnesium hydroxide (MILK OF MAGNESIA) suspension 30 mL  30 mL Oral Daily PRN Charm RingsJamison Y Lord, NP      . multivitamin with minerals tablet 1 tablet  1 tablet Oral Daily Craige CottaFernando A Cobos, MD   1 tablet at 06/19/16 0801  . QUEtiapine (SEROQUEL) tablet 50 mg  50 mg Oral TID PRN Jomarie LongsSaramma Valerye Kobus, MD   50 mg at 06/19/16 1142  . thiamine (VITAMIN B-1) tablet 100 mg  100 mg Oral Daily Craige CottaFernando A Cobos, MD   100 mg at 06/19/16 0801  . topiramate (TOPAMAX) tablet 50 mg  50 mg Oral BID Charm RingsJamison Y Lord, NP   50 mg at 06/19/16 0800  . venlafaxine XR (EFFEXOR-XR) 24 hr capsule 37.5 mg  37.5 mg Oral Q breakfast Craige CottaFernando A Cobos, MD   37.5 mg at 06/19/16 1144    Lab Results:  Results for orders placed or performed during the hospital encounter of 06/15/16 (from the past 48 hour(s))  T3, free     Status: None   Collection Time: 06/18/16  6:10 AM  Result Value Ref Range   T3, Free 3.9 2.0 - 4.4 pg/mL    Comment: (NOTE) Performed At: [facility] [address] [place], [place] [id number] Mila HomerHancock William F MD WU:[id number]:[phone] Performed at [facility],  [address] [place], [place] [address]   T4, free     Status: Abnormal   Collection Time: 06/18/16  6:10 AM  Result Value Ref Range   Free T4 0.55 (L) 0.61 - 1.12 ng/dL    Comment: (NOTE) Biotin ingestion may interfere with free T4 tests. If the results are inconsistent with the TSH level, previous test results, or the clinical presentation, then consider biotin interference. If needed, order  repeat testing after stopping biotin. Performed at [facility], [address] [place], [place] [address]     Blood Alcohol level:  Lab Results  Component Value Date   [facility] <5 06/14/2016    Metabolic Disorder Labs: Lab Results  Component Value Date   HGBA1C 4.7 (L) 06/17/2016   MPG 88 06/17/2016   Lab Results  Component Value Date   PROLACTIN 60.7 (H) 06/17/2016   Lab Results  Component Value Date   CHOL 118 06/17/2016   TRIG 100 06/17/2016   HDL 33 (L) 06/17/2016   CHOLHDL 3.6 06/17/2016   VLDL 20 06/17/2016   LDLCALC 65 06/17/2016    Physical Findings: AIMS: Facial and Oral Movements Muscles of Facial Expression: None, normal Lips and Perioral Area: None, normal Jaw: None, normal Tongue: None, normal,Extremity Movements Upper (arms, wrists, hands, fingers): None, normal Lower (legs, knees, ankles, toes): None, normal, Trunk Movements Neck, shoulders, hips: None, normal, Overall Severity Severity of abnormal movements (highest score from questions above): None, normal Incapacitation due to abnormal movements: None, normal Patient's awareness of abnormal movements (rate only patient's report): No Awareness, Dental Status Current problems with teeth and/or dentures?: No Does patient usually wear dentures?: No  CIWA:  CIWA-Ar Total: 12 COWS:     Musculoskeletal: Strength & Muscle Tone: within normal limits Gait & Station: normal Patient leans: N/A  Psychiatric Specialty Exam: Physical Exam  Nursing note and vitals reviewed.   Review of Systems   Psychiatric/Behavioral: Positive for depression, hallucinations and suicidal ideas. The patient is nervous/anxious and has insomnia.   All other systems reviewed and are negative.    Blood pressure 135/74, pulse (!) 110, temperature 98.4 F (36.9 C), temperature source Oral, resp. rate 16, height 5\' 4"  (1.626 m), weight 69.4 kg (153 lb), last menstrual period 06/12/2016, SpO2 100 %, unknown if currently breastfeeding.Body mass index is 26.26 kg/m.  General Appearance: Fairly Groomed  Eye Contact:  Fair  Speech:  Normal Rate  Volume:  Normal  Mood:  Depressed, anxious , agitated   Affect:  Anxious, tearful  Thought Process:  Goal Directed and Descriptions of Associations: Circumstantial  Orientation:  Full (Time, Place, and Person)  Thought Content:  Hallucinations: Auditory and Rumination, obsessive violent thoughts that she does not want to discuss  Suicidal Thoughts:  Yes.  without intent/plan denies plan  Homicidal Thoughts:  No - has violent thoughts - does not elaborate  Memory:  Immediate;   Fair Recent;   Fair Remote;   Fair  Judgement:  Fair  Insight:  Fair  Psychomotor Activity:  Restlessness  Concentration:  Concentration: Poor and Attention Span: Poor  Recall:  Fair  Fund of Knowledge:  Good  Language:  Good  Akathisia:  No  Handed:  Right  AIMS (if indicated):     Assets:  Desire for Improvement Resilience  ADL's:  Intact  Cognition:  WNL  Sleep:  Number of Hours: 6.75   Assessment -Patient continues to be depressed, has worsening anxiety attacks , panic attacks , has OCD sx- violent intrusive thoughts - does not elaborate . Pt reports new medications likely causing her to hallucinate and making her panic sx to get worse.Some time was spent discussing and educating patient about her diagnosis , medications and how her sx can sometimes get worse when she is being tried on new medications that are still at low dosages .Discussed make use of coping skills as well as PRN  medications .    Major depressive  disorder, recurrent severe without psychotic features (HCC) unstable  Will continue today 06/19/16 plan as below except where it is noted.     Treatment Plan Summary: Daily contact with patient to assess and evaluate symptoms and progress in treatment, Medication management, Plan see below and medications as below Encourage group and milieu participation to work on coping skills and symptom reduction D/C Geodon due to side effects- see above Will discontinue Abilify due to ADRs. Start Seroquel 50 mg po tid prn for the next few days , until she is more stable , less anxious. Continue Topamax 50 mgrs BID for mood disorder Continue Lamictal 25 mgr QDAY for mood disorder, depression Will discontinue Remeron for lack of efficacy. Start Doxepin 10 mg po qhs for insomnia - plan to titrate higher as needed.Reviewed EKG - wnl, pt also denies hx of cardiac issues or seizures. Will increase  Effexor XR to 75  Mg po  QDAY as needed , possibly tomorrow , for depression, anxiety , OCD symptoms. Reviewed labs - T4 - low, t3, tsh - wnl, will continue cytomel, pt to have repeat thyroid function and monitor it with her PMD. Treatment team working on disposition planning options.  Tina Leyva, MD 06/19/2016, 12:56 PM   Patient ID: [name], female   DOB: 11-06-1990, 25 y.o.   MRN: [id number]

## 2016-06-20 DIAGNOSIS — F419 Anxiety disorder, unspecified: Secondary | ICD-10-CM

## 2016-06-20 DIAGNOSIS — Z9049 Acquired absence of other specified parts of digestive tract: Secondary | ICD-10-CM

## 2016-06-20 MED ORDER — LORAZEPAM 0.5 MG PO TABS
0.5000 mg | ORAL_TABLET | Freq: Two times a day (BID) | ORAL | Status: DC | PRN
  Administered 2016-06-20 – 2016-06-22 (×5): 0.5 mg via ORAL
  Filled 2016-06-20 (×5): qty 1

## 2016-06-20 NOTE — Progress Notes (Signed)
Pt attend group. Her day was a 3 progress to 4. Her goal not to be anxious. She need to control her thinking terrible thoughts. This did not work. She talk to the doctor.  Not sure if medicine could work of if she need to be taken off. She will talk to nurse tonight.

## 2016-06-20 NOTE — Progress Notes (Signed)
[facility]  06/20/2016 5:07 PM [name]  MRN:  [id number] Subjective: Patient states that she feels that her medications are making it worse.  Tearful and decompensating quickly, breathing heavy.   Objective :Patient seen and chart reviewed.Discussed patient with treatment team.  Pt today seen as distressed , anxious , reports worsening sadness, anxiety sx as well as poor sleep last night. Pt was started on new meds but patient claims to experience ADR'sbilify , she feels she started hallucinating out of nowhere as well as had panic attacks back to back. Unknown if her medications are causing it or her withdrawing from her previous medications. Today she is deferring on taking either Effexor and Lamictal.    Principal Problem: Major depressive disorder, recurrent severe without psychotic features (HCC) Diagnosis:   Patient Active Problem List   Diagnosis Date Noted  . Major depressive disorder, recurrent severe without psychotic features (HCC) [F33.2] 06/15/2016  . Major depressive disorder, single episode, severe without psychosis (HCC) [F32.2] 06/15/2016  . Normal labor and delivery [O80] 11/23/2010   Total Time spent with patient:25  minutes   Past psychiatric hx: Please see H&P.   Past Medical History:  Past Medical History:  Diagnosis Date  . Anxiety   . Bipolar 1 disorder (HCC)   . Costochondritis   . Depression   . History of kidney stones   . PONV (postoperative nausea and vomiting)     Past Surgical History:  Procedure Laterality Date  . APPENDECTOMY    . CHOLECYSTECTOMY    . LAPAROSCOPY N/A 04/16/2015   Procedure: LAPAROSCOPY DIAGNOSTIC;  Surgeon: Levi AlandMark E Anderson, MD;  Location: [facility];  Service: Gynecology;  Laterality: N/A;   Family History: Please see H&P.  Family History  Problem Relation Age of Onset  . Alcohol abuse Father   . Depression Father   . Mental illness Father   . Drug abuse Father   . Depression Mother   . Birth defects  Sister   . Depression Maternal Grandmother   . Heart disease Maternal Grandmother   . Hypertension Maternal Grandmother   . Depression Maternal Grandfather   . Heart disease Maternal Grandfather   . Hypertension Maternal Grandfather   . Depression Paternal Grandmother   . Heart disease Paternal Grandmother   . Hypertension Paternal Grandmother   . Depression Paternal Grandfather   . Heart disease Paternal Grandfather   . Cancer Paternal Grandfather   . Alcohol abuse Paternal Grandfather   . Hypertension Paternal Grandfather    Social History: Please see H&P.  History  Alcohol Use  . Yes     History  Drug Use No    Social History   Social History  . Marital status: Single    Spouse name: N/A  . Number of children: N/A  . Years of education: N/A   Social History Main Topics  . Smoking status: Never Smoker  . Smokeless tobacco: Never Used  . Alcohol use Yes  . Drug use: No  . Sexual activity: Yes    Birth control/ protection: None   Other Topics Concern  . None   Social History Narrative  . None   Additional Social History:    Pain Medications: See MAR  Prescriptions: See MAR Over the Counter: See MAR History of alcohol / drug use?: Yes Name of Substance 1: Alcohol 1 - Age of First Use: unknown 1 - Frequency: "I have only drank 3 times in my life" 1 - Duration: since [age]  1 - Last Use / Amount: yesterday  Sleep: Poor  Appetite:  Fair  Current Medications: Current Facility-Administered Medications  Medication Dose Route Frequency Provider Last Rate Last Dose  . acetaminophen (TYLENOL) tablet 650 mg  650 mg Oral Q6H PRN Charm Rings, NP   650 mg at 06/17/16 2119  . alum & mag hydroxide-simeth (MAALOX/MYLANTA) 200-200-20 MG/5ML suspension 30 mL  30 mL Oral Q4H PRN Charm Rings, NP      . dextromethorphan-guaiFENesin [facility] DM) 30-600 MG per 12 hr tablet 1 tablet  1 tablet Oral BID Adonis Brook, NP   1 tablet at 06/20/16 1623  . doxepin (SINEQUAN)  capsule 10 mg  10 mg Oral QHS Jomarie Longs, MD   10 mg at 06/19/16 2108  . hydrOXYzine (ATARAX/VISTARIL) tablet 25 mg  25 mg Oral Q6H PRN Sanjuana Kava, NP   25 mg at 06/20/16 1623  . ibuprofen (ADVIL,MOTRIN) tablet 600 mg  600 mg Oral Q6H PRN Kerry Hough, PA-C   600 mg at 06/20/16 1623  . lamoTRIgine (LAMICTAL) tablet 25 mg  25 mg Oral Daily Craige Cotta, MD   25 mg at 06/19/16 1145  . liothyronine (CYTOMEL) tablet 25 mcg  25 mcg Oral Daily Charm Rings, NP   25 mcg at 06/20/16 0818  . LORazepam (ATIVAN) tablet 0.5 mg  0.5 mg Oral BID PRN Adonis Brook, NP   0.5 mg at 06/20/16 1703  . magnesium hydroxide (MILK OF MAGNESIA) suspension 30 mL  30 mL Oral Daily PRN Charm Rings, NP      . multivitamin with minerals tablet 1 tablet  1 tablet Oral Daily Craige Cotta, MD   1 tablet at 06/20/16 0818  . QUEtiapine (SEROQUEL) tablet 50 mg  50 mg Oral TID PRN Jomarie Longs, MD   50 mg at 06/20/16 0819  . thiamine (VITAMIN B-1) tablet 100 mg  100 mg Oral Daily Craige Cotta, MD   100 mg at 06/20/16 0818  . topiramate (TOPAMAX) tablet 50 mg  50 mg Oral BID Charm Rings, NP   50 mg at 06/20/16 1622  . venlafaxine XR (EFFEXOR-XR) 24 hr capsule 37.5 mg  37.5 mg Oral Q breakfast Craige Cotta, MD   37.5 mg at 06/20/16 1706    Lab Results:  No results found for this or any previous visit (from the past 48 hour(s)).  Blood Alcohol level:  Lab Results  Component Value Date   ETH <5 06/14/2016    Metabolic Disorder Labs: Lab Results  Component Value Date   HGBA1C 4.7 (L) 06/17/2016   MPG 88 06/17/2016   Lab Results  Component Value Date   PROLACTIN 60.7 (H) 06/17/2016   Lab Results  Component Value Date   CHOL 118 06/17/2016   TRIG 100 06/17/2016   HDL 33 (L) 06/17/2016   CHOLHDL 3.6 06/17/2016   VLDL 20 06/17/2016   LDLCALC 65 06/17/2016    Physical Findings: AIMS: Facial and Oral Movements Muscles of Facial Expression: None, normal Lips and Perioral Area: None,  normal Jaw: None, normal Tongue: None, normal,Extremity Movements Upper (arms, wrists, hands, fingers): None, normal Lower (legs, knees, ankles, toes): None, normal, Trunk Movements Neck, shoulders, hips: None, normal, Overall Severity Severity of abnormal movements (highest score from questions above): None, normal Incapacitation due to abnormal movements: None, normal Patient's awareness of abnormal movements (rate only patient's report): No Awareness, Dental Status Current problems with teeth and/or dentures?: No Does patient usually wear  dentures?: No  CIWA:  CIWA-Ar Total: 3 COWS:     Musculoskeletal: Strength & Muscle Tone: within normal limits Gait & Station: normal Patient leans: N/A  Psychiatric Specialty Exam: Physical Exam  Nursing note and vitals reviewed.   Review of Systems  Psychiatric/Behavioral: Positive for depression, hallucinations and suicidal ideas. The patient is nervous/anxious and has insomnia.   All other systems reviewed and are negative.    Blood pressure 138/78, pulse (!) 129, temperature 98 F (36.7 C), resp. rate 18, height 5\' 4"  (1.626 m), weight 69.4 kg (153 lb), last menstrual period 06/12/2016, SpO2 100 %, unknown if currently breastfeeding.Body mass index is 26.26 kg/m.  General Appearance: Fairly Groomed  Eye Contact:  Fair  Speech:  Normal Rate  Volume:  Normal  Mood:  Depressed, anxious , agitated   Affect:  Anxious, tearful  Thought Process:  Goal Directed and Descriptions of Associations: Circumstantial  Orientation:  Full (Time, Place, and Person)  Thought Content:  Hallucinations: Auditory and Rumination, obsessive violent thoughts that she does not want to discuss  Suicidal Thoughts:  Yes.  without intent/plan denies plan  Homicidal Thoughts:  No - has violent thoughts - does not elaborate  Memory:  Immediate;   Fair Recent;   Fair Remote;   Fair  Judgement:  Fair  Insight:  Fair  Psychomotor Activity:  Restlessness   Concentration:  Concentration: Poor and Attention Span: Poor  Recall:  Fair  Fund of Knowledge:  Good  Language:  Good  Akathisia:  No  Handed:  Right  AIMS (if indicated):     Assets:  Desire for Improvement Resilience  ADL's:  Intact  Cognition:  WNL  Sleep:  Number of Hours: 6   Assessment -Patient continues to be depressed, has worsening anxiety attacks , panic attacks , has OCD sx- violent intrusive thoughts - does not elaborate . Pt reports new medications likely causing her to hallucinate and making her panic sx to get worse.Some time was spent discussing and educating patient about her diagnosis , medications and how her sx can sometimes get worse when she is being tried on new medications that are still at low dosages .Discussed make use of coping skills as well as PRN medications .   Major depressive disorder, recurrent severe without psychotic features (HCC) unstable  Will continue today 06/20/16 plan as below except where it is noted.  Treatment Plan Summary: Daily contact with patient to assess and evaluate symptoms and progress in treatment, Medication management, Plan see below and medications as below Encourage group and milieu participation to work on coping skills and symptom reduction D/C Geodon due to side effects- see above Will discontinue Abilify due to ADRs. Start Seroquel 50 mg po tid prn for the next few days , until she is more stable , less anxious. Continue Topamax 50 mgrs BID for mood disorder Continue Lamictal 25 mgr QDAY for mood disorder, depression--refused today Will discontinue Remeron for lack of efficacy. Start Doxepin 10 mg po qhs for insomnia - plan to titrate higher as needed.Reviewed EKG - wnl, pt also denies hx of cardiac issues or seizures. Effexor XR to 75  Mg po  QDAY as needed , possibly tomorrow , for depression, anxiety , OCD symptoms - initially deferred but was able to take in the afternoon with extensive encouragement.   Reviewed  labs - T4 - low, t3, tsh - wnl, will continue cytomel, pt to have repeat thyroid function and monitor it with her PMD. Treatment team  working on disposition planning options.  Lindwood Qua, NP [facility] 06/20/2016, 5:07 PM

## 2016-06-20 NOTE — Progress Notes (Signed)
D: Patient up and visible in the milieu. Spoke with patient 1:1. Rates sleep as poor, appetite as poor, energy as low and concentration as good. Patient's affect anxious, depressed with congruent mood. Rating depression at an 8/10, hopelessness at a 7/10 and anxiety at a 10/10. States goal for today is to "not have anxiety attacks and to feel somewhat normal again." Expresses concern that med changes have made her worse. "I don't know if it's the meds that have been stopped or the new meds." Did not want to take lamictal or effexor without speaking with provider first. Denies pain, physical problems.   A: Medicated per orders, seroquel and vistaril prn given per patient's request. Emotional support offered and self inventory reviewed. Encouraged patient to speak with MD/NP and encouraged completion of Suicide Safety Plan. Discussed POC with NP, SW.   R: Patient verbalizes understanding of POC. On reassess, patient less anxious. Patient denies SI/HI and remains safe on level III obs.

## 2016-06-20 NOTE — [facility] Group Notes (Signed)
Adult Therapy Group Note (Clinical Social Work)  Date:  06/20/2016  Time:  10:00-11:00AM  Group Topic/Focus: Fears and Healthy/Unhealthy Coping Skills  Building Self Esteem:   The Focus of this group was to discuss some of the prevalent fears that patients experience, and to identify the commonalities among group members.  An exercise was used to initiate the discussion, followed by writing on the white board a group-generated list of unhealthy coping and healthy coping techniques to deal with each fear, as well as supports that could help in using healthy coping.  This included a variety of supports, and CSW emphasized professional supports such as therapist, support groups and psychiatrist.  Radical acceptance was discussed briefly.  Participation Level:  Minimal  Participation Quality:  Resistant  Affect:  Anxious and Blunted  Cognitive:  N/A  Insight: Limited  Engagement in Group:  Poor  Modes of Intervention:  Discussion, Exploration and Support  Additional Comments:  The patient expressed at the beginning of group that she has few coping skills, so just needs to learn some in general.  She stated right now all she really knows to do is journal and listen to music.  When we were starting the first activity, she left the room "due to anxiety" and did not return.  Ambrose MantleMareida Grossman-Orr, LCSW 06/20/2016   12:35pm

## 2016-06-20 NOTE — Progress Notes (Signed)
Pt attend group. Her day was a 4. Her goal was medication need to work for her anxiety level was high. She cant sleep. She tried to take an afternoon nap could not sleep. She could not make group early today.

## 2016-06-20 NOTE — Plan of Care (Signed)
Problem: Coping: Goal: Ability to demonstrate self-control will improve Outcome: Progressing Patient is calm and cooperative on unit.  Problem: Self-Concept: Goal: Level of anxiety will decrease Outcome: Not Progressing Patient remains anxious, states she is have periods of panic.

## 2016-06-21 MED ORDER — MIRTAZAPINE 15 MG PO TABS
15.0000 mg | ORAL_TABLET | Freq: Every day | ORAL | Status: DC
  Filled 2016-06-21: qty 1

## 2016-06-21 MED ORDER — MIRTAZAPINE 7.5 MG PO TABS
7.5000 mg | ORAL_TABLET | Freq: Every day | ORAL | Status: DC
  Administered 2016-06-21: 7.5 mg via ORAL
  Filled 2016-06-21 (×2): qty 1

## 2016-06-21 NOTE — Sepsis Progress Note (Addendum)
D Yvonna AlanisKaitlyn  is seen OOB UAL on the [address] hall today. She demonstrates an increase in her  distress tolerance as evidenced by her  experiencing fewer episodes of panic and anxiety  Today. She took her am meds this morning...with much trepidation..  then took a prn ativan at 0751 , saying to this writer  " I just can't stand this anxeity" . A SHe completes her daily asessment and on it she wrote she  HAS  experienced SI today but she contracts with writer to not hurt self. She rated her depression, hopelessness and anxiety " 12/14/08", respectively. She is stuck on whether or not she is allergic / sensitive to the lamictal and can't get past this . R Safety in place. She remains very anxious and worrried about her medications and how they will affect her . support offered to pt by RN.

## 2016-06-21 NOTE — Plan of Care (Signed)
Problem: Health Behavior/Discharge Planning: Goal: Compliance with therapeutic regimen will improve Outcome: Progressing Patient taking medications as prescribed. Patient educated about regimen. Medications reviewed with NP today.

## 2016-06-21 NOTE — [facility] Group Notes (Signed)
[facility] Group Notes: (Clinical Social Work)   06/21/2016      Type of Therapy:  Group Therapy   Participation Level:  Did Not Attend despite MHT prompting   Randal Goens Grossman-Orr, LCSW 06/21/2016, 12:12 PM     

## 2016-06-21 NOTE — Progress Notes (Signed)
[facility] MD Progress Note  06/21/2016 11:16 AM [name]  MRN:  [id number] Subjective:  Patient continues to have anxiety about her medication and ADR's.  However, stated that she had taken her Lamictal and her Effexor and tolerating thus far.   Objective :Patient seen and chart reviewed.Discussed patient with treatment team.  Pt today seen as distressed , anxious , reports worsening sadness, anxiety sx as well as poor sleep last night.  Unknown if her medications are causing it or her withdrawing from her previous medications. Today she has taken both Effexor and Lamictal.    Principal Problem: Major depressive disorder, recurrent severe without psychotic features (HCC) Diagnosis:   Patient Active Problem List   Diagnosis Date Noted  . Major depressive disorder, recurrent severe without psychotic features (HCC) [F33.2] 06/15/2016  . Major depressive disorder, single episode, severe without psychosis (HCC) [F32.2] 06/15/2016  . Normal labor and delivery [O80] 11/23/2010   Total Time spent with patient:25  minutes   Past psychiatric hx: Please see H&P.   Past Medical History:  Past Medical History:  Diagnosis Date  . Anxiety   . Bipolar 1 disorder (HCC)   . Costochondritis   . Depression   . History of kidney stones   . PONV (postoperative nausea and vomiting)     Past Surgical History:  Procedure Laterality Date  . APPENDECTOMY    . CHOLECYSTECTOMY    . LAPAROSCOPY N/A 04/16/2015   Procedure: LAPAROSCOPY DIAGNOSTIC;  Surgeon: Levi Aland, MD;  Location: [facility];  Service: Gynecology;  Laterality: N/A;   Family History: Please see H&P.  Family History  Problem Relation Age of Onset  . Alcohol abuse Father   . Depression Father   . Mental illness Father   . Drug abuse Father   . Depression Mother   . Birth defects Sister   . Depression Maternal Grandmother   . Heart disease Maternal Grandmother   . Hypertension Maternal Grandmother   . Depression Maternal  Grandfather   . Heart disease Maternal Grandfather   . Hypertension Maternal Grandfather   . Depression Paternal Grandmother   . Heart disease Paternal Grandmother   . Hypertension Paternal Grandmother   . Depression Paternal Grandfather   . Heart disease Paternal Grandfather   . Cancer Paternal Grandfather   . Alcohol abuse Paternal Grandfather   . Hypertension Paternal Grandfather    Social History: Please see H&P.  History  Alcohol Use  . Yes     History  Drug Use No    Social History   Social History  . Marital status: Single    Spouse name: N/A  . Number of children: N/A  . Years of education: N/A   Social History Main Topics  . Smoking status: Never Smoker  . Smokeless tobacco: Never Used  . Alcohol use Yes  . Drug use: No  . Sexual activity: Yes    Birth control/ protection: None   Other Topics Concern  . None   Social History Narrative  . None   Additional Social History:    Pain Medications: See MAR  Prescriptions: See MAR Over the Counter: See MAR History of alcohol / drug use?: Yes Name of Substance 1: Alcohol 1 - Age of First Use: unknown 1 - Frequency: "I have only drank 3 times in my life" 1 - Duration: since 23 1 - Last Use / Amount: yesterday  Sleep: Poor  Appetite:  Fair  Current Medications: Current Facility-Administered Medications  Medication Dose  Route Frequency Provider Last Rate Last Dose  . acetaminophen (TYLENOL) tablet 650 mg  650 mg Oral Q6H PRN Charm Rings, NP   650 mg at 06/17/16 2119  . alum & mag hydroxide-simeth (MAALOX/MYLANTA) 200-200-20 MG/5ML suspension 30 mL  30 mL Oral Q4H PRN Charm Rings, NP      . dextromethorphan-guaiFENesin [facility] DM) 30-600 MG per 12 hr tablet 1 tablet  1 tablet Oral BID Adonis Brook, NP   1 tablet at 06/21/16 0748  . hydrOXYzine (ATARAX/VISTARIL) tablet 25 mg  25 mg Oral Q6H PRN Sanjuana Kava, NP   25 mg at 06/20/16 1623  . ibuprofen (ADVIL,MOTRIN) tablet 600 mg  600 mg Oral Q6H  PRN Kerry Hough, PA-C   600 mg at 06/20/16 1623  . lamoTRIgine (LAMICTAL) tablet 25 mg  25 mg Oral Daily Craige Cotta, MD   25 mg at 06/21/16 0748  . liothyronine (CYTOMEL) tablet 25 mcg  25 mcg Oral Daily Charm Rings, NP   25 mcg at 06/21/16 0747  . LORazepam (ATIVAN) tablet 0.5 mg  0.5 mg Oral BID PRN Adonis Brook, NP   0.5 mg at 06/21/16 0751  . magnesium hydroxide (MILK OF MAGNESIA) suspension 30 mL  30 mL Oral Daily PRN Charm Rings, NP      . mirtazapine (REMERON) tablet 15 mg  15 mg Oral QHS Adonis Brook, NP      . multivitamin with minerals tablet 1 tablet  1 tablet Oral Daily Craige Cotta, MD   1 tablet at 06/21/16 0747  . QUEtiapine (SEROQUEL) tablet 50 mg  50 mg Oral TID PRN Jomarie Longs, MD   50 mg at 06/21/16 0922  . thiamine (VITAMIN B-1) tablet 100 mg  100 mg Oral Daily Craige Cotta, MD   100 mg at 06/21/16 0748  . topiramate (TOPAMAX) tablet 50 mg  50 mg Oral BID Charm Rings, NP   50 mg at 06/21/16 0748  . venlafaxine XR (EFFEXOR-XR) 24 hr capsule 37.5 mg  37.5 mg Oral Q breakfast Craige Cotta, MD   37.5 mg at 06/21/16 1610    Lab Results:  No results found for this or any previous visit (from the past 48 hour(s)).  Blood Alcohol level:  Lab Results  Component Value Date   ETH <5 06/14/2016    Metabolic Disorder Labs: Lab Results  Component Value Date   HGBA1C 4.7 (L) 06/17/2016   MPG 88 06/17/2016   Lab Results  Component Value Date   PROLACTIN 60.7 (H) 06/17/2016   Lab Results  Component Value Date   CHOL 118 06/17/2016   TRIG 100 06/17/2016   HDL 33 (L) 06/17/2016   CHOLHDL 3.6 06/17/2016   VLDL 20 06/17/2016   LDLCALC 65 06/17/2016    Physical Findings: AIMS: Facial and Oral Movements Muscles of Facial Expression: None, normal Lips and Perioral Area: None, normal Jaw: None, normal Tongue: None, normal,Extremity Movements Upper (arms, wrists, hands, fingers): None, normal Lower (legs, knees, ankles, toes): None,  normal, Trunk Movements Neck, shoulders, hips: None, normal, Overall Severity Severity of abnormal movements (highest score from questions above): None, normal Incapacitation due to abnormal movements: None, normal Patient's awareness of abnormal movements (rate only patient's report): No Awareness, Dental Status Current problems with teeth and/or dentures?: No Does patient usually wear dentures?: No  CIWA:  CIWA-Ar Total: 1 COWS:     Musculoskeletal: Strength & Muscle Tone: within normal limits Gait & Station: normal Patient  leans: N/A  Psychiatric Specialty Exam: Physical Exam  Nursing note and vitals reviewed.   Review of Systems  Psychiatric/Behavioral: Positive for depression, hallucinations and suicidal ideas. The patient is nervous/anxious and has insomnia.   All other systems reviewed and are negative.    Blood pressure 120/72, pulse (!) 123, temperature 99.2 F (37.3 C), resp. rate 16, height 5\' 4"  (1.626 m), weight 69.4 kg (153 lb), last menstrual period 06/12/2016, SpO2 100 %, unknown if currently breastfeeding.Body mass index is 26.26 kg/m.  General Appearance: Fairly Groomed  Eye Contact:  Fair  Speech:  Normal Rate  Volume:  Normal  Mood:  Depressed, anxious , agitated   Affect:  Anxious, tearful  Thought Process:  Goal Directed and Descriptions of Associations: Circumstantial  Orientation:  Full (Time, Place, and Person)  Thought Content:  Hallucinations: Auditory and Rumination, obsessive violent thoughts that she does not want to discuss  Suicidal Thoughts:  Yes.  without intent/plan denies plan  Homicidal Thoughts:  No - has violent thoughts - does not elaborate  Memory:  Immediate;   Fair Recent;   Fair Remote;   Fair  Judgement:  Fair  Insight:  Fair  Psychomotor Activity:  Restlessness  Concentration:  Concentration: Poor and Attention Span: Poor  Recall:  Fair  Fund of Knowledge:  Good  Language:  Good  Akathisia:  No  Handed:  Right  AIMS (if  indicated):     Assets:  Desire for Improvement Resilience  ADL's:  Intact  Cognition:  WNL  Sleep:  Number of Hours: 6.25   Assessment -Patient continues to be depressed, has worsening anxiety attacks , panic attacks , has OCD sx- violent intrusive thoughts - does not elaborate . Pt reports new medications likely causing her to hallucinate and making her panic sx to get worse.  Spent some time as with previous provider discussing and educating patient about her diagnosis , medications and how her sx can sometimes get worse when she is being tried on new medications that are still at low dosages.  Discussed make use of coping skills as well as PRN medications .   Major depressive disorder, recurrent severe without psychotic features (HCC) unstable  Will continue today 06/21/16 plan as below except where it is noted.  Treatment Plan Summary: Daily contact with patient to assess and evaluate symptoms and progress in treatment, Medication management, Plan see below and medications as below Encourage group and milieu participation to work on coping skills and symptom reduction D/C Geodon due to side effects- see above Will discontinue Abilify due to ADRs. Start Seroquel 50 mg po tid prn for the next few days , until she is more stable , less anxious. Continue Topamax 50 mgrs BID for mood disorder Continue Lamictal 25 mgr QDAY for mood disorder, depression--refused today Will discontinue Remeron for lack of efficacy. Start Doxepin 10 mg po qhs for insomnia - plan to titrate higher as needed.Reviewed EKG - wnl, pt also denies hx of cardiac issues or seizures. Effexor XR to 75  Mg po  QDAY as needed , possibly tomorrow , for depression, anxiety , OCD symptoms - initially deferred but was able to take in the afternoon with extensive encouragement.   Reviewed labs - T4 - low, t3, tsh - wnl, will continue cytomel, pt to have repeat thyroid function and monitor it with her PMD. Treatment team working  on disposition planning options. Patient has Klonopin from outside provider, once Ativan PRN was ordered yesterday BID PRN, as  suspected, patient was more open to taking her Lamictal and Effexor.  D/C Doxepin.  Start Remeron 7.5 mg QHS insomnia.  Lindwood Qua, NP [facility] 06/21/2016, 11:16 AM

## 2016-06-21 NOTE — Progress Notes (Signed)
Nursing Progress Note 7p-7a  D) Patient presents anxious and depressed. Patient states "all these medications changes are difficult. I am trying to cope but my anxiety is high". Patient states "i don't want to be mean but I can't sleep with my roommate. She talks in her sleep and ear plugs haven't helped". Patient denies SI/HI/AVH or pain. Patient contracts for safety at this time.  A) Emotional support given. Patient medicated with PM and PRN orders as prescribed. Medications reviewed with patient. Patient on q15 min safety checks. Opportunities for questions or concerns presented to patient. Patient encouraged to continue to work on treatment goals. Patient moved from 400-1 to 405-1. Patient agreeable to room change.  R) Patient receptive to interaction with nurse. Patient remains safe on the unit at this time. Patient is resting in bed without complaints. Will continue to monitor.

## 2016-06-22 MED ORDER — VENLAFAXINE HCL ER 75 MG PO CP24
75.0000 mg | ORAL_CAPSULE | Freq: Every day | ORAL | Status: DC
  Administered 2016-06-23 – 2016-06-25 (×3): 75 mg via ORAL
  Filled 2016-06-22 (×6): qty 1

## 2016-06-22 MED ORDER — LAMOTRIGINE 25 MG PO TABS
25.0000 mg | ORAL_TABLET | Freq: Two times a day (BID) | ORAL | Status: DC
  Administered 2016-06-23 – 2016-06-26 (×7): 25 mg via ORAL
  Filled 2016-06-22 (×11): qty 1

## 2016-06-22 MED ORDER — QUETIAPINE FUMARATE 200 MG PO TABS
200.0000 mg | ORAL_TABLET | Freq: Every day | ORAL | Status: DC
  Administered 2016-06-22: 200 mg via ORAL
  Filled 2016-06-22 (×2): qty 1

## 2016-06-22 NOTE — [facility] Group Notes (Signed)
Adult Psychoeducational Group Notes  Date:  06/22/2016  Time:  5:07 PM  Type of Therapy:  The focus of this group is to help patients review their daily goal of treatment and discuss progress on daily workbooks.   Participation Level:  Active  Participation Quality:  Appropriate  Affect:  Appropriate  Cognitive:  Appropriate  Insight:  Appropriate  Engagement in Group:  Engaged  Modes of Intervention:  Discussion and Education  Summary of Progress/Problems: Pt attended group and actively participated. Pt rated her day a 7. Pt stated that she felt okay today. Pt stated that she wants to work on coping with her anxiety.   [name] N [name] 06/22/2016, 5:07 PM

## 2016-06-22 NOTE — Plan of Care (Signed)
Problem: Education: Goal: Mental status will improve Outcome: Not Progressing Patient continues to complain of daily anxiety attacks.

## 2016-06-22 NOTE — Progress Notes (Signed)
D: Pt was in the dayroom upon initial approach.  Pt presents with depressed, anxious affect and mood.  Pt reports her day was "okay."  Her goal was to "talk to the doctor about my medications and try to cope with my anxiety."  Pt reports she is "back on my Seroquel so I'm feeling okay with that."  Pt reports passive SI without a plan.  Denies HI, denies hallucinations, denies pain.  Pt has been visible in milieu interacting with peers and staff appropriately.  Pt attended evening group.    A: Introduced self to pt.  Actively listened to pt and offered support and encouragement. Medications administered per order.  PRN medication administered for anxiety.  R: Pt is safe on the unit.  Pt is compliant with medications.  Pt verbally contracts for safety.

## 2016-06-22 NOTE — Progress Notes (Signed)
D   Pt is depressed and anxious    She interacts minimally with others and reports minimal relief from anxiety after taking medications    She is treatment compliant  A   Verbal support given   Medications administered and effectiveness monitored    Q 15 min checks R    Pt is safe at present time

## 2016-06-22 NOTE — Progress Notes (Signed)
Adult Psychoeducational Group Note  Date:  06/22/2016 Time:  10:58 PM  Group Topic/Focus:  Wrap-Up Group:   The focus of this group is to help patients review their daily goal of treatment and discuss progress on daily workbooks.  Participation Level:  Active  Participation Quality:  Appropriate  Affect:  Appropriate  Cognitive:  Alert  Insight: Appropriate  Engagement in Group:  Engaged  Modes of Intervention:  Discussion  Additional Comments:  Patient rated her day a 5. Patient's goal for today was to cope with anxiety.   [name] 06/22/2016, 10:58 PM

## 2016-06-22 NOTE — Plan of Care (Signed)
Problem: Safety: Goal: Ability to disclose and discuss suicidal ideas will improve Outcome: Progressing Pt endorses passive SI without a plan.  She verbally contracts for safety.

## 2016-06-22 NOTE — Progress Notes (Signed)
D: Patient remains anxious stating, "I'm having anxiety attacks every day."  Patient states she has been having side effects from her medications and they have been changed.  Patient reports passive self harm thoughts with no specific plan.  Her goal today is to "talk to doctor about my medicines and problems I came across recently.  Also work on coping with anxiety."  Patient is able to contract for safety on the unit.  She rates her depression as an 8; hopelessness as a 7; anxiety as a 10.   A: Continue to monitor medication management and MD orders.  Safety checks completed every 15 minutes per protocol.  Offer support and encouragement as needed. R: Patient is receptive to staff; her behavior is appropriate.

## 2016-06-22 NOTE — [facility] Group Notes (Signed)
[facility] LCSW Group Therapy  06/22/2016 1:15pm  Type of Therapy: Group Therapy   Topic: Overcoming Obstacles  Participation Level: Pt invited. Did not attend.  06/22/16 4:33 PM Jonathon JordanLynn B Analeya Luallen, MSW, Theresia MajorsLCSWA [phone]

## 2016-06-22 NOTE — Progress Notes (Signed)
[facility] MD Progress Note  06/22/2016 11:29 AM [name]  MRN:  [id number] Subjective: Patient states she has remained depressed, anxious . She states she has remembered that for a long period of time ( years, starting in childhood) , she had intermittent experiences such as " hearing a little girl call my name, hearing noises and movements ". She states she had always thought these could have been spiritual experiences, " ghosts ", but that they had resolved , " completely stopped" over the last year,but that now she realizes " the reason they stopped was because I started Seroquel". She worries that this means that " it was not a spiritual experience, it was that I am mentally  ill ".  States that she requested to be restarted on Seroquel because it " works better for me, more effective ". Denies medication side effects at this time. Objective :Patient seen and chart reviewed.Discussed patient with treatment team.  As above, patient ruminates about significance of prior intermittent hallucinations , which had resolved . She describes some , but not all of these experiences, as nigthtime occurrences suggestive of hypnopompic or hypnagogic phenomena, although does state they sometimes occurred " in the middle of the day".  She responds well to support, reassurance, review of ego strengths , resilience. Of note, at this time patient not endorsing or presenting with any psychotic symptoms, not internally preoccupied, no delusions expressed. Also does not endorse /present with any negative type symptoms. Remains anxious, ruminative, focused on above issues. Affect constricted, but improved as session progressed. Denies SI.  No disruptive or agitated behaviors on unit.   Principal Problem: Major depressive disorder, recurrent severe without psychotic features (HCC) Diagnosis:   Patient Active Problem List   Diagnosis Date Noted  . Major depressive disorder, recurrent severe without psychotic features  (HCC) [F33.2] 06/15/2016  . Major depressive disorder, single episode, severe without psychosis (HCC) [F32.2] 06/15/2016  . Normal labor and delivery [O80] 11/23/2010   Total Time spent with patient:25  minutes   Past psychiatric hx: Please see H&P.   Past Medical History:  Past Medical History:  Diagnosis Date  . Anxiety   . Bipolar 1 disorder (HCC)   . Costochondritis   . Depression   . History of kidney stones   . PONV (postoperative nausea and vomiting)     Past Surgical History:  Procedure Laterality Date  . APPENDECTOMY    . CHOLECYSTECTOMY    . LAPAROSCOPY N/A 04/16/2015   Procedure: LAPAROSCOPY DIAGNOSTIC;  Surgeon: Levi Aland, MD;  Location: [facility];  Service: Gynecology;  Laterality: N/A;   Family History: Please see H&P.  Family History  Problem Relation Age of Onset  . Alcohol abuse Father   . Depression Father   . Mental illness Father   . Drug abuse Father   . Depression Mother   . Birth defects Sister   . Depression Maternal Grandmother   . Heart disease Maternal Grandmother   . Hypertension Maternal Grandmother   . Depression Maternal Grandfather   . Heart disease Maternal Grandfather   . Hypertension Maternal Grandfather   . Depression Paternal Grandmother   . Heart disease Paternal Grandmother   . Hypertension Paternal Grandmother   . Depression Paternal Grandfather   . Heart disease Paternal Grandfather   . Cancer Paternal Grandfather   . Alcohol abuse Paternal Grandfather   . Hypertension Paternal Grandfather    Social History: Please see H&P.  History  Alcohol Use  . Yes  History  Drug Use No    Social History   Social History  . Marital status: Single    Spouse name: N/A  . Number of children: N/A  . Years of education: N/A   Social History Main Topics  . Smoking status: Never Smoker  . Smokeless tobacco: Never Used  . Alcohol use Yes  . Drug use: No  . Sexual activity: Yes    Birth control/ protection: None    Other Topics Concern  . None   Social History Narrative  . None   Additional Social History:    Pain Medications: See MAR  Prescriptions: See MAR Over the Counter: See MAR History of alcohol / drug use?: Yes Name of Substance 1: Alcohol 1 - Age of First Use: unknown 1 - Frequency: "I have only drank 3 times in my life" 1 - Duration: since 23 1 - Last Use / Amount: yesterday  Sleep: improved   Appetite:  Fair  Current Medications: Current Facility-Administered Medications  Medication Dose Route Frequency Provider Last Rate Last Dose  . acetaminophen (TYLENOL) tablet 650 mg  650 mg Oral Q6H PRN Charm Rings, NP   650 mg at 06/17/16 2119  . alum & mag hydroxide-simeth (MAALOX/MYLANTA) 200-200-20 MG/5ML suspension 30 mL  30 mL Oral Q4H PRN Charm Rings, NP      . dextromethorphan-guaiFENesin [facility] DM) 30-600 MG per 12 hr tablet 1 tablet  1 tablet Oral BID Adonis Brook, NP   1 tablet at 06/22/16 0756  . hydrOXYzine (ATARAX/VISTARIL) tablet 25 mg  25 mg Oral Q6H PRN Sanjuana Kava, NP   25 mg at 06/20/16 1623  . ibuprofen (ADVIL,MOTRIN) tablet 600 mg  600 mg Oral Q6H PRN Kerry Hough, PA-C   600 mg at 06/20/16 1623  . [START ON 06/23/2016] lamoTRIgine (LAMICTAL) tablet 25 mg  25 mg Oral BID Rockey Situ Cobos, MD      . liothyronine (CYTOMEL) tablet 25 mcg  25 mcg Oral Daily Charm Rings, NP   25 mcg at 06/22/16 0756  . LORazepam (ATIVAN) tablet 0.5 mg  0.5 mg Oral BID PRN Adonis Brook, NP   0.5 mg at 06/22/16 0800  . magnesium hydroxide (MILK OF MAGNESIA) suspension 30 mL  30 mL Oral Daily PRN Charm Rings, NP      . multivitamin with minerals tablet 1 tablet  1 tablet Oral Daily Craige Cotta, MD   1 tablet at 06/22/16 0756  . QUEtiapine (SEROQUEL) tablet 200 mg  200 mg Oral QHS Fernando A Cobos, MD      . thiamine (VITAMIN B-1) tablet 100 mg  100 mg Oral Daily Craige Cotta, MD   100 mg at 06/22/16 0756  . topiramate (TOPAMAX) tablet 50 mg  50 mg Oral BID  Charm Rings, NP   50 mg at 06/22/16 0756  . [START ON 06/23/2016] venlafaxine XR (EFFEXOR-XR) 24 hr capsule 75 mg  75 mg Oral Q breakfast Craige Cotta, MD        Lab Results:  No results found for this or any previous visit (from the past 48 hour(s)).  Blood Alcohol level:  Lab Results  Component Value Date   ETH <5 06/14/2016    Metabolic Disorder Labs: Lab Results  Component Value Date   HGBA1C 4.7 (L) 06/17/2016   MPG 88 06/17/2016   Lab Results  Component Value Date   PROLACTIN 60.7 (H) 06/17/2016   Lab Results  Component Value  Date   CHOL 118 06/17/2016   TRIG 100 06/17/2016   HDL 33 (L) 06/17/2016   CHOLHDL 3.6 06/17/2016   VLDL 20 06/17/2016   LDLCALC 65 06/17/2016    Physical Findings: AIMS: Facial and Oral Movements Muscles of Facial Expression: None, normal Lips and Perioral Area: None, normal Jaw: None, normal Tongue: None, normal,Extremity Movements Upper (arms, wrists, hands, fingers): None, normal Lower (legs, knees, ankles, toes): None, normal, Trunk Movements Neck, shoulders, hips: None, normal, Overall Severity Severity of abnormal movements (highest score from questions above): None, normal Incapacitation due to abnormal movements: None, normal Patient's awareness of abnormal movements (rate only patient's report): No Awareness, Dental Status Current problems with teeth and/or dentures?: No Does patient usually wear dentures?: No  CIWA:  CIWA-Ar Total: 0 COWS:     Musculoskeletal: Strength & Muscle Tone: within normal limits Gait & Station: normal Patient leans: N/A  Psychiatric Specialty Exam: Physical Exam  Nursing note and vitals reviewed.   Review of Systems  Psychiatric/Behavioral: Positive for depression, hallucinations and suicidal ideas. The patient is nervous/anxious and has insomnia.   All other systems reviewed and are negative.    Blood pressure 134/66, pulse (!) 123, temperature 98.9 F (37.2 C), temperature source  Oral, resp. rate 16, height 5\' 4"  (1.626 m), weight 69.4 kg (153 lb), last menstrual period 06/12/2016, SpO2 100 %, unknown if currently breastfeeding.Body mass index is 26.26 kg/m.  General Appearance: improved grooming   Eye Contact:  Good  Speech:  Normal Rate  Volume:  Normal  Mood:  Remains depressed and anxious   Affect:  Anxious, but more reactive   Thought Process:  Goal directed, linear   Orientation:  Full (Time, Place, and Person)  Thought Content: ruminative, at this time not presenting with hallucinations, no delusions, not internally preoccupied   Suicidal Thoughts:  No suicidal or self injurious ideations , able to contract for safety at this time  Homicidal Thoughts:  No  Memory:  Recent and remote grossly intact   Judgement:  Improving   Insight:  Fair  Psychomotor Activity:  Normal  Concentration:  Concentration: Good and Attention Span: Good  Recall:  Good  Fund of Knowledge:  Good  Language:  Good  Akathisia:  No  Handed:  Right  AIMS (if indicated):   no abnormal or involuntary movements noted or reactive   Assets:  Desire for Improvement Resilience  ADL's:  Intact  Cognition:  WNL  Sleep:  Number of Hours: 6.75   Assessment - patient remains vaguely depressed, anxious, ruminative . She has tended to express concern about her mental state, sanity, due to symptoms of anxiety, obsessions and today also reports long history of intermittent brief auditory hallucinations. Patient is more responsive to reassurance , support . Of note, she is not currently presenting with psychotic symptoms, no delusions, no paranoid ideations, and not internally preoccupied. Also, no negative symptoms. States Seroquel worked better for her and wants to be restarted on this medication . We have reviewed side effects  Regarding Cytomel, patient states that it was started several weeks ago for mild hypothyroidism, denies side effects, TSH and FT3/4 have been ordered, reviewed .    Major  depressive disorder, recurrent severe without psychotic features [facility](HCC)   Treatment Plan Summary: Daily contact with patient to assess and evaluate symptoms and progress in treatment, Medication management, Plan see below and medications as below Encourage group and milieu participation to work on coping skills and symptom reduction Restart Seroquel  at 200 mgrs QHS for mood disorder. Continue Topamax 50 mgrs BID for mood disorder Increase  Lamictal to 25  mgr BID for mood disorder, depression Increase Effexor XR to 75  mgrs  QDAY for depression, anxiety   Continue Ativan 0.5 mgrs BID PRN for anxiety as needed  Continue Cytomel 25 micrograms QDAY for hypothyroidism- patient plans to follow up with her PCP after discharge for ongoing management  Treatment team working on disposition planning options  Nehemiah Massed, MD  06/22/2016, 11:29 AM   Patient ID: [name], female   DOB: [birth year], 26 y.o.   MRN: [id number]

## 2016-06-22 NOTE — Progress Notes (Signed)
Recreation Therapy Notes  Date: 06/22/16 Time: 0930 Location: [address] Group Room  Group Topic: Stress Management  Goal Area(s) Addresses:  Patient will verbalize importance of using healthy stress management.  Patient will identify positive emotions associated with healthy stress management.   Intervention: Stress Management  Activity :  Meditation.  LRT played a meditation on how to deal with stress from the Calm app.  Patients were to follow along as the meditation played.  Education:  Stress Management, Discharge Planning.   Education Outcome: Acknowledges edcuation/In group clarification offered/Needs additional education  Clinical Observations/Feedback: Pt did not attend group.   Kenson Groh, LRT/CTRS         Sylar Voong A 06/22/2016 1:12 PM 

## 2016-06-23 MED ORDER — QUETIAPINE FUMARATE 300 MG PO TABS
300.0000 mg | ORAL_TABLET | Freq: Every day | ORAL | Status: DC
  Administered 2016-06-23 – 2016-06-24 (×2): 300 mg via ORAL
  Filled 2016-06-23 (×5): qty 1

## 2016-06-23 MED ORDER — LORAZEPAM 0.5 MG PO TABS
0.5000 mg | ORAL_TABLET | Freq: Four times a day (QID) | ORAL | Status: DC | PRN
  Administered 2016-06-25 – 2016-06-26 (×2): 0.5 mg via ORAL
  Filled 2016-06-23 (×2): qty 1

## 2016-06-23 NOTE — [facility] Group Notes (Signed)
[facility] Group Notes:  (Nursing/MHT/Case Management/Adjunct)  Date:  06/23/2016  Time: 0900 am  Type of Therapy:  Psychoeducational Skills  Participation Level:  Active  Participation Quality:  Appropriate and Attentive  Affect:  Appropriate  Cognitive:  Alert and Appropriate  Insight:  Improving  Engagement in Group:  Engaged  Modes of Intervention:  Support  Summary of Progress/Problems: Patient participating in mindfulness exercise.  Reported a decrease in her anxiety.  Cranford MonBeaudry, Donesha Wallander Evans 06/23/2016, 4:29 PM

## 2016-06-23 NOTE — Progress Notes (Signed)
D: Patient is tearful this morning.  Her affect is flat; her mood is depressed.  Patient states that she is fearful she will lose her job.  She states "Lauren, the Child psychotherapistsocial worker, faxed her a paper stating I was in the hospital.  She has said that if I don't return to work soon, she will let me go."  Patient does not feel she is not progressing.  She rates her depression as an 8; anxiety as a 9; hopelessness as a 5.  She reports passive suicidal thoughts with no specific plan.  Her goal today is to "stay positive and not over think everything."  Patient states she "tossed and turned last night."  She also states that the seroquel helped with any auditory hallucinations she was having. A: Continue to monitor medication management and MD orders.  Continue safety checks every 15 minutes per protocol.  Offer support and encouragement as needed. R: Patient is receptive to staff; her behavior is appropriate.

## 2016-06-23 NOTE — [facility] Group Notes (Signed)
[facility] LCSW Group Therapy 06/23/2016 1:15 PM  Type of Therapy: Group Therapy- Feelings about Diagnosis  Participation Level: Minimal  Participation Quality:  Attentive  Affect:  Flat  Cognitive: Alert and Oriented   Insight:  Unable to assess  Engagement in Therapy: Developing/Improving and Engaged   Modes of Intervention: Clarification, Confrontation, Discussion, Education, Exploration, Limit-setting, Orientation, Problem-solving, Rapport Building, Dance movement psychotherapisteality Testing, Socialization and Support  Description of Group:   This group will allow patients to explore their thoughts and feelings about diagnoses they have received. Patients will be guided to explore their level of understanding and acceptance of these diagnoses. Facilitator will encourage patients to process their thoughts and feelings about the reactions of others to their diagnosis, and will guide patients in identifying ways to discuss their diagnosis with significant others in their lives. This group will be process-oriented, with patients participating in exploration of their own experiences as well as giving and receiving support and challenge from other group members.  Summary of Progress/Problems:  Pt did not participate in group discussion, however remained attentive throughout.  Therapeutic Modalities:   Cognitive Behavioral Therapy Solution Focused Therapy Motivational Interviewing Relapse Prevention Therapy  Vernie ShanksLauren Paulene Tayag, LCSW 06/23/2016 4:38 PM

## 2016-06-23 NOTE — Progress Notes (Addendum)
[facility] MD Progress Note  06/23/2016 11:45 AM [name]  MRN:  [id number] Subjective: Patient reports she continues to feel  anxious . She also endorses some ongoing depression. Describes some passive thoughts of death, dying but denies active suicidal ideations and is able to contract for safety on unit. Today more ruminative about her job, fears she may lose job due to being out for several days. Denies medication side effects. Does report she is feeling slightly better , which she attributes to being back on Seroquel.  Objective :Patient seen and chart reviewed.Discussed patient with treatment team.  Patient presents anxious, depressed, and intermittently tearful. Denies active suicidal ideations and contracts for safety at this time. She is future oriented, and spoke for example about disposition plans- states she resides in [place], so would have some difficulty in coming to [place] on a regular basis, is interested in outpatient treatment locally .  Patient reports partial improvement on Seroquel, today does not endorse hallucinations/psychotic symptoms and does not appear internally preoccupied . Patient remains apprehensive about discharge. As above, has expressed concern about her job status, fears she may lose job due to being out, has requested CSW to send information to employer regarding her hospitalization. Visible in day room,no  disruptive or agitated behaviors on unit. Interactive with selected peers.    Principal Problem: Major depressive disorder, recurrent severe without psychotic features (HCC) Diagnosis:   Patient Active Problem List   Diagnosis Date Noted  . Major depressive disorder, recurrent severe without psychotic features (HCC) [F33.2] 06/15/2016  . Major depressive disorder, single episode, severe without psychosis (HCC) [F32.2] 06/15/2016  . Normal labor and delivery [O80] 11/23/2010   Total Time spent with patient:25  minutes   Past psychiatric hx:  Please see H&P.   Past Medical History:  Past Medical History:  Diagnosis Date  . Anxiety   . Bipolar 1 disorder (HCC)   . Costochondritis   . Depression   . History of kidney stones   . PONV (postoperative nausea and vomiting)     Past Surgical History:  Procedure Laterality Date  . APPENDECTOMY    . CHOLECYSTECTOMY    . LAPAROSCOPY N/A 04/16/2015   Procedure: LAPAROSCOPY DIAGNOSTIC;  Surgeon: Levi Aland, MD;  Location: [facility];  Service: Gynecology;  Laterality: N/A;   Family History: Please see H&P.  Family History  Problem Relation Age of Onset  . Alcohol abuse Father   . Depression Father   . Mental illness Father   . Drug abuse Father   . Depression Mother   . Birth defects Sister   . Depression Maternal Grandmother   . Heart disease Maternal Grandmother   . Hypertension Maternal Grandmother   . Depression Maternal Grandfather   . Heart disease Maternal Grandfather   . Hypertension Maternal Grandfather   . Depression Paternal Grandmother   . Heart disease Paternal Grandmother   . Hypertension Paternal Grandmother   . Depression Paternal Grandfather   . Heart disease Paternal Grandfather   . Cancer Paternal Grandfather   . Alcohol abuse Paternal Grandfather   . Hypertension Paternal Grandfather    Social History: Please see H&P.  History  Alcohol Use  . Yes     History  Drug Use No    Social History   Social History  . Marital status: Single    Spouse name: N/A  . Number of children: N/A  . Years of education: N/A   Social History Main Topics  . Smoking status:  Never Smoker  . Smokeless tobacco: Never Used  . Alcohol use Yes  . Drug use: No  . Sexual activity: Yes    Birth control/ protection: None   Other Topics Concern  . None   Social History Narrative  . None   Additional Social History:    Pain Medications: See MAR  Prescriptions: See MAR Over the Counter: See MAR History of alcohol / drug use?: Yes Name of Substance 1:  Alcohol 1 - Age of First Use: unknown 1 - Frequency: "I have only drank 3 times in my life" 1 - Duration: since 23 1 - Last Use / Amount: yesterday  Sleep: improved   Appetite:  Fair  Current Medications: Current Facility-Administered Medications  Medication Dose Route Frequency Provider Last Rate Last Dose  . acetaminophen (TYLENOL) tablet 650 mg  650 mg Oral Q6H PRN Charm Rings, NP   650 mg at 06/17/16 2119  . alum & mag hydroxide-simeth (MAALOX/MYLANTA) 200-200-20 MG/5ML suspension 30 mL  30 mL Oral Q4H PRN Charm Rings, NP      . dextromethorphan-guaiFENesin [facility] DM) 30-600 MG per 12 hr tablet 1 tablet  1 tablet Oral BID Adonis Brook, NP   1 tablet at 06/23/16 0757  . hydrOXYzine (ATARAX/VISTARIL) tablet 25 mg  25 mg Oral Q6H PRN Sanjuana Kava, NP   25 mg at 06/22/16 2000  . ibuprofen (ADVIL,MOTRIN) tablet 600 mg  600 mg Oral Q6H PRN Kerry Hough, PA-C   600 mg at 06/20/16 1623  . lamoTRIgine (LAMICTAL) tablet 25 mg  25 mg Oral BID Craige Cotta, MD   25 mg at 06/23/16 0757  . liothyronine (CYTOMEL) tablet 25 mcg  25 mcg Oral Daily Charm Rings, NP   25 mcg at 06/23/16 0756  . LORazepam (ATIVAN) tablet 0.5 mg  0.5 mg Oral BID PRN Adonis Brook, NP   0.5 mg at 06/22/16 0800  . magnesium hydroxide (MILK OF MAGNESIA) suspension 30 mL  30 mL Oral Daily PRN Charm Rings, NP      . multivitamin with minerals tablet 1 tablet  1 tablet Oral Daily Craige Cotta, MD   1 tablet at 06/23/16 0757  . QUEtiapine (SEROQUEL) tablet 300 mg  300 mg Oral QHS Avian Greenawalt A Temesgen Weightman, MD      . thiamine (VITAMIN B-1) tablet 100 mg  100 mg Oral Daily Craige Cotta, MD   100 mg at 06/23/16 0757  . topiramate (TOPAMAX) tablet 50 mg  50 mg Oral BID Charm Rings, NP   50 mg at 06/23/16 0757  . venlafaxine XR (EFFEXOR-XR) 24 hr capsule 75 mg  75 mg Oral Q breakfast Craige Cotta, MD   75 mg at 06/23/16 0757    Lab Results:  No results found for this or any previous visit (from the  past 48 hour(s)).  Blood Alcohol level:  Lab Results  Component Value Date   ETH <5 06/14/2016    Metabolic Disorder Labs: Lab Results  Component Value Date   HGBA1C 4.7 (L) 06/17/2016   MPG 88 06/17/2016   Lab Results  Component Value Date   PROLACTIN 60.7 (H) 06/17/2016   Lab Results  Component Value Date   CHOL 118 06/17/2016   TRIG 100 06/17/2016   HDL 33 (L) 06/17/2016   CHOLHDL 3.6 06/17/2016   VLDL 20 06/17/2016   LDLCALC 65 06/17/2016    Physical Findings: AIMS: Facial and Oral Movements Muscles of Facial Expression: None,  normal Lips and Perioral Area: None, normal Jaw: None, normal Tongue: None, normal,Extremity Movements Upper (arms, wrists, hands, fingers): None, normal Lower (legs, knees, ankles, toes): None, normal, Trunk Movements Neck, shoulders, hips: None, normal, Overall Severity Severity of abnormal movements (highest score from questions above): None, normal Incapacitation due to abnormal movements: None, normal Patient's awareness of abnormal movements (rate only patient's report): No Awareness, Dental Status Current problems with teeth and/or dentures?: No Does patient usually wear dentures?: No  CIWA:  CIWA-Ar Total: 0 COWS:     Musculoskeletal: Strength & Muscle Tone: within normal limits Gait & Station: normal Patient leans: N/A  Psychiatric Specialty Exam: Physical Exam  Nursing note and vitals reviewed.   Review of Systems  Psychiatric/Behavioral: Positive for depression, hallucinations and suicidal ideas. The patient is nervous/anxious and has insomnia.   All other systems reviewed and are negative.    Blood pressure 134/66, pulse (!) 123, temperature 98.9 F (37.2 C), temperature source Oral, resp. rate 16, height 5\' 4"  (1.626 m), weight 69.4 kg (153 lb), last menstrual period 06/12/2016, SpO2 100 %, unknown if currently breastfeeding.Body mass index is 26.26 kg/m.  General Appearance: improved grooming   Eye Contact:  Good   Speech:  Normal Rate  Volume:  Normal  Mood:  Depressed, anxious   Affect:  Anxious, intermittently tearful , although does smile briefly at times   Thought Process:  Goal directed, linear   Orientation:  Full (Time, Place, and Person)  Thought Content: ruminative, currently not internally preoccupied, no delusions   Suicidal Thoughts: some passive SI, but no plan or intention of hurting self or of suicide, contracts for safety on unit   Homicidal Thoughts:  No  Memory:  Recent and remote grossly intact   Judgement:  Fair   Insight: fair   Psychomotor Activity:  Normal- no psychomotor agitation  Concentration:  Concentration: Good and Attention Span: Good  Recall:  Good  Fund of Knowledge:  Good  Language:  Good  Akathisia:  No  Handed:  Right  AIMS (if indicated):   no abnormal or involuntary movements noted or reactive   Assets:  Desire for Improvement Resilience  ADL's:  Intact  Cognition:  WNL  Sleep:  Number of Hours: 6.75   Assessment - patient remains depressed, anxious, although reports partial improvement on Seroquel. She reports some passive SI, but no plan or intention, contracts for safety on unit, and is future oriented . She is tolerating medications well .  Ruminative about work , potentially losing job.    Major depressive disorder, recurrent severe without psychotic features [facility](HCC)   Treatment Plan Summary: Daily contact with patient to assess and evaluate symptoms and progress in treatment, Medication management, Plan see below and medications as below Encourage group and milieu participation to work on coping skills and symptom reduction Increase Seroquel  To 300 mgrs QHS for mood disorder. Continue Topamax 50 mgrs BID for mood disorder Continue Lamictal  25  mgr BID for mood disorder, depression Continue Effexor XR  75  mgrs  QDAY for depression, anxiety   Change  Ativan to  0.5 mgrs Q 6 hours PRN for anxiety as needed  Continue Cytomel 25 micrograms QDAY  for hypothyroidism Treatment team working on disposition planning options  Nehemiah MassedOBOS, Myha Arizpe, MD  06/23/2016, 11:45 AM   Patient ID: [name], female   DOB: 12/03/1990, 26 y.o.   MRN: [id number]

## 2016-06-23 NOTE — Progress Notes (Signed)
Adult Psychoeducational Group Note  Date:  06/23/2016 Time:  9:48 PM  Group Topic/Focus:  Wrap-Up Group:   The focus of this group is to help patients review their daily goal of treatment and discuss progress on daily workbooks.  Participation Level:  Active  Participation Quality:  Appropriate  Affect:  Appropriate  Cognitive:  Alert  Insight: Appropriate  Engagement in Group:  Engaged  Modes of Intervention:  Discussion  Additional Comments: Patient rates her day a 5. Patient states she had an emotional break down. Patient's goal for today was to stay positive.   [name] L [name] 06/23/2016, 9:48 PM

## 2016-06-23 NOTE — Plan of Care (Signed)
Problem: Education: Goal: Emotional status will improve Outcome: Not Progressing Patient is tearful and continues to report passive self harm thoughts.

## 2016-06-23 NOTE — Progress Notes (Signed)
Recreation Therapy Notes  Animal-Assisted Activity (AAA) Program Checklist/Progress Notes Patient Eligibility Criteria Checklist & Daily Group note for Rec TxIntervention  Date: 02.13.2018 Time: 2:45pm [address]    AAA/T Program Assumption of Risk Form signed by Patient/ or Parent Legal Guardian Yes  Patient is free of allergies or sever asthma Yes  Patient reports no fear of animals Yes  Patient reports no history of cruelty to animals Yes  Patient understands his/her participation is voluntary Yes  Patient washes hands before animal contact Yes  Patient washes hands after animal contact Yes  Behavioral Response: Appropriate   Education:Hand Washing, Appropriate Animal Interaction   Education Outcome: Acknowledges education.   Clinical Observations/Feedback: Patient attended session and interacted appropriately with therapy dog and peers.   [name], LRT/CTRS       Elric Tirado L 06/23/2016 3:08 PM

## 2016-06-24 NOTE — [facility] Group Notes (Signed)
[facility] LCSW Group Therapy 06/24/2016 1:15 PM  Type of Therapy: Group Therapy- Emotion Regulation  Participation Level: Active   Participation Quality:  Appropriate  Affect: Appropriate  Cognitive: Alert and Oriented   Insight:  Developing/Improving  Engagement in Therapy: Developing/Improving   Modes of Intervention: Clarification, Confrontation, Discussion, Education, Exploration, Limit-setting, Orientation, Problem-solving, Rapport Building, Dance movement psychotherapisteality Testing, Socialization and Support  Summary of Progress/Problems: The topic for group today was emotional regulation. This group focused on both positive and negative emotion identification and allowed group members to process ways to identify feelings, regulate negative emotions, and find healthy ways to manage internal/external emotions. Group members were asked to reflect on a time when their reaction to an emotion led to a negative outcome and explored how alternative responses using emotion regulation would have benefited them. Group members were also asked to discuss a time when emotion regulation was utilized when a negative emotion was experienced. Pt was more active in discussion today and processed how she was hoping to learn how to share her feelings more openly. She expressed that her family had modeled that emotions are weak and depression is not real; therefore, she feels no need to express her emotions. Pt reports that this causes her to bottle her emotions, leading to "sudden shut downs" and crises.     Tina ShanksLauren Maeley Matton, LCSW 06/24/2016 3:02 PM

## 2016-06-24 NOTE — Progress Notes (Signed)
D: Patient remains depressed with suicidal ideation.  Patient reports her depression as an 8; hopelessness as a 5; anxiety as a 7.  She is sleeping fair; her energy level is low; her appetite is poor.  Her goal today is to "stay positive and not think so negative and irrational.  Focus on right now and not everything at once."  Patient does not seem to be progressing with her treatment. A: Continue to monitor medication management and MD orders.  Safety checks continued every 15 minutes per protocol.  Offer support and encouragement as needed. R: Patient is receptive to staff; her behavior is appropriate.

## 2016-06-24 NOTE — [facility] Group Notes (Signed)
Pt attended spiritual care group on grief and loss facilitated by chaplain Burnis KingfisherMatthew Meleane Selinger   Group opened with brief discussion and psycho-social ed around grief and loss in relationships and in relation to self - identifying life patterns, circumstances, changes that cause losses. Established group norm of speaking from own life experience. Group goal of establishing open and affirming space for members to share loss and experience with grief, normalize grief experience and provide psycho social education and grief support.    Tina AlanisKaitlyn was present throughout group.  Expressed resonance as another group member discussed growing up in family that did not speak about emotions.

## 2016-06-24 NOTE — Progress Notes (Signed)
D: Pt was in the dayroom upon initial approach.  Pt presents with depressed affect and mood.  She reports her day was "okay" and then states "I cried myself to sleep today."  Pt is attached to female peer on the unit, who reported she cried herself to sleep today as well.  Pt states "group kind of threw me off, I feel better now."  Pt reports her goal was to "try to be positive, I did not succeed."  Pt reports passive SI without a plan, denies HI, denies hallucinations, denies pain.  Pt has been visible in milieu interacting primarily with one female peer.  Pt attended evening group.    A: Actively listened to pt and offered support and encouragement. Medication administered per order.  Q15 minute safety checks maintained.   R: Pt is safe on the unit.  Pt is compliant with medications.  Pt verbally contracts for safety.

## 2016-06-24 NOTE — Progress Notes (Signed)
Recreation Therapy Notes  Date: 06/24/16 Time: 0930 Location: [address] Dayroom  Group Topic: Stress Management  Goal Area(s) Addresses:  Patient will verbalize importance of using healthy stress management.  Patient will identify positive emotions associated with healthy stress management.   Intervention: Stress Management  Activity :  Resilience Meditation.  LRT introduced the stress management technique of meditation.  LRT play the meditation from the Calm app to allow patients to engage in the activity.  Patients were to follow along as the meditation was played to fully engage in the technique.  Education:  Stress Management, Discharge Planning.   Education Outcome: Acknowledges edcuation/In group clarification offered/Needs additional education  Clinical Observations/Feedback: Pt did not attend group.   Caroll RancherMarjette Denece Shearer, LRT/CTRS         Caroll RancherLindsay, Maryhelen Lindler A 06/24/2016 12:27 PM

## 2016-06-24 NOTE — [facility] Group Notes (Signed)
Pt stated her day was a 6. Her anxiety was up thing about outside issues and its overwhelming. Job, apartment, finances. Talking in group open up a lot of emotional earlier.

## 2016-06-24 NOTE — Progress Notes (Signed)
[facility] MD Progress Note  06/24/2016 1:11 PM [name]  MRN:  [id number] Subjective: Patient continues to report significant symptoms, reports ongoing depression, feeling sad most of the time, easily overwhelmed when thinking about her job and current social stressors, ruminates about being unhappy in her job, but being unable to quit due to financial considerations. Reports ongoing passive suicidal ideations, although denies any active plan or intention of hurting self or of SI , contracts for safety on unit . Reports improvement insofar as obsessive ruminations, or compulsive thinking patterns, and feels anxiety dimension of symptoms has improved. Denies medication side effects.   Objective :Patient seen and chart reviewed.Discussed patient with treatment team.  As discussed with team, patient has continued to present with significant depression, constricted affect, intermittent tearfulness . She continues to present with a constricted affect at this time,although does smile briefly at times. She is ruminative about her work and financial difficulties .  No disruptive or agitated behaviors in milieu. Group participation is limited. Staff has noted her interacting mostly with another female peer, whom she states she feels has been supportive , empathic. Denies medication side effects. We have reviewed psychiatric history - describes history of depression, depressive episodes, but continues to endorse brief episodes of increased / excessive energy, racing thoughts, and does state " I do think I am bipolar ".  Also endorses significant anxiety symptoms, including OCD symptoms   Principal Problem: Major depressive disorder, recurrent severe without psychotic features (HCC) Diagnosis:   Patient Active Problem List   Diagnosis Date Noted  . Major depressive disorder, recurrent severe without psychotic features (HCC) [F33.2] 06/15/2016  . Major depressive disorder, single episode, severe  without psychosis (HCC) [F32.2] 06/15/2016  . Normal labor and delivery [O80] 11/23/2010   Total Time spent with patient:25  minutes   Past psychiatric hx: Please see H&P.   Past Medical History:  Past Medical History:  Diagnosis Date  . Anxiety   . Bipolar 1 disorder (HCC)   . Costochondritis   . Depression   . History of kidney stones   . PONV (postoperative nausea and vomiting)     Past Surgical History:  Procedure Laterality Date  . APPENDECTOMY    . CHOLECYSTECTOMY    . LAPAROSCOPY N/A 04/16/2015   Procedure: LAPAROSCOPY DIAGNOSTIC;  Surgeon: Levi Aland, MD;  Location: [facility];  Service: Gynecology;  Laterality: N/A;   Family History: Please see H&P.  Family History  Problem Relation Age of Onset  . Alcohol abuse Father   . Depression Father   . Mental illness Father   . Drug abuse Father   . Depression Mother   . Birth defects Sister   . Depression Maternal Grandmother   . Heart disease Maternal Grandmother   . Hypertension Maternal Grandmother   . Depression Maternal Grandfather   . Heart disease Maternal Grandfather   . Hypertension Maternal Grandfather   . Depression Paternal Grandmother   . Heart disease Paternal Grandmother   . Hypertension Paternal Grandmother   . Depression Paternal Grandfather   . Heart disease Paternal Grandfather   . Cancer Paternal Grandfather   . Alcohol abuse Paternal Grandfather   . Hypertension Paternal Grandfather    Social History: Please see H&P.  History  Alcohol Use  . Yes     History  Drug Use No    Social History   Social History  . Marital status: Single    Spouse name: N/A  . Number of children: N/A  .  Years of education: N/A   Social History Main Topics  . Smoking status: Never Smoker  . Smokeless tobacco: Never Used  . Alcohol use Yes  . Drug use: No  . Sexual activity: Yes    Birth control/ protection: None   Other Topics Concern  . None   Social History Narrative  . None   Additional  Social History:    Pain Medications: See MAR  Prescriptions: See MAR Over the Counter: See MAR History of alcohol / drug use?: Yes Name of Substance 1: Alcohol 1 - Age of First Use: unknown 1 - Frequency: "I have only drank 3 times in my life" 1 - Duration: since 23 1 - Last Use / Amount: yesterday  Sleep: improved   Appetite:  Fair  Current Medications: Current Facility-Administered Medications  Medication Dose Route Frequency Provider Last Rate Last Dose  . acetaminophen (TYLENOL) tablet 650 mg  650 mg Oral Q6H PRN Charm RingsJamison Y Lord, NP   650 mg at 06/17/16 2119  . alum & mag hydroxide-simeth (MAALOX/MYLANTA) 200-200-20 MG/5ML suspension 30 mL  30 mL Oral Q4H PRN Charm RingsJamison Y Lord, NP      . [facility]an[facility](MUCINEX DM) 30-600 MG per 12 hr tablet 1 tablet  1 tablet Oral BID Adonis BrookSheila Agustin, NP   1 tablet at 06/24/16 0752  . hydrOXYzine (ATARAX/VISTARIL) tablet 25 mg  25 mg Oral Q6H PRN Sanjuana KavaAgnes I Nwoko, NP   25 mg at 06/22/16 2000  . ibuprofen (ADVIL,MOTRIN) tablet 600 mg  600 mg Oral Q6H PRN Kerry HoughSpencer E Simon, PA-C   600 mg at 06/20/16 1623  . lamoTRIgine (LAMICTAL) tablet 25 mg  25 mg Oral BID Craige CottaFernando A Cobos, MD   25 mg at 06/24/16 0752  . liothyronine (CYTOMEL) tablet 25 mcg  25 mcg Oral Daily Charm RingsJamison Y Lord, NP   25 mcg at 06/24/16 0751  . LORazepam (ATIVAN) tablet 0.5 mg  0.5 mg Oral Q6H PRN Rockey SituFernando A Cobos, MD      . magnesium hydroxide (MILK OF MAGNESIA) suspension 30 mL  30 mL Oral Daily PRN Charm RingsJamison Y Lord, NP      . multivitamin with minerals tablet 1 tablet  1 tablet Oral Daily Craige CottaFernando A Cobos, MD   1 tablet at 06/24/16 0751  . QUEtiapine (SEROQUEL) tablet 300 mg  300 mg Oral QHS Craige CottaFernando A Cobos, MD   300 mg at 06/23/16 2155  . thiamine (VITAMIN B-1) tablet 100 mg  100 mg Oral Daily Craige CottaFernando A Cobos, MD   100 mg at 06/24/16 0752  . topiramate (TOPAMAX) tablet 50 mg  50 mg Oral BID Charm RingsJamison Y Lord, NP   50 mg at 06/24/16 0751  . venlafaxine XR (EFFEXOR-XR) 24 hr  capsule 75 mg  75 mg Oral Q breakfast Craige CottaFernando A Cobos, MD   75 mg at 06/24/16 0751    Lab Results:  No results found for this or any previous visit (from the past 48 hour(s)).  Blood Alcohol level:  Lab Results  Component Value Date   ETH <5 06/14/2016    Metabolic Disorder Labs: Lab Results  Component Value Date   HGBA1C 4.7 (L) 06/17/2016   MPG 88 06/17/2016   Lab Results  Component Value Date   PROLACTIN 60.7 (H) 06/17/2016   Lab Results  Component Value Date   CHOL 118 06/17/2016   TRIG 100 06/17/2016   HDL 33 (L) 06/17/2016   CHOLHDL 3.6 06/17/2016   VLDL 20 06/17/2016   LDLCALC 65 06/17/2016  Physical Findings: AIMS: Facial and Oral Movements Muscles of Facial Expression: None, normal Lips and Perioral Area: None, normal Jaw: None, normal Tongue: None, normal,Extremity Movements Upper (arms, wrists, hands, fingers): None, normal Lower (legs, knees, ankles, toes): None, normal, Trunk Movements Neck, shoulders, hips: None, normal, Overall Severity Severity of abnormal movements (highest score from questions above): None, normal Incapacitation due to abnormal movements: None, normal Patient's awareness of abnormal movements (rate only patient's report): No Awareness, Dental Status Current problems with teeth and/or dentures?: No Does patient usually wear dentures?: No  CIWA:  CIWA-Ar Total: 0 COWS:     Musculoskeletal: Strength & Muscle Tone: within normal limits Gait & Station: normal Patient leans: N/A  Psychiatric Specialty Exam: Physical Exam  Nursing note and vitals reviewed.   Review of Systems  Psychiatric/Behavioral: Positive for depression, hallucinations and suicidal ideas. The patient is nervous/anxious and has insomnia.   All other systems reviewed and are negative.    Blood pressure 118/72, pulse (!) 106, temperature 98 F (36.7 C), temperature source Oral, resp. rate 16, height 5\' 4"  (1.626 m), weight 69.4 kg (153 lb), last menstrual  period 06/12/2016, SpO2 100 %, unknown if currently breastfeeding.Body mass index is 26.26 kg/m.  General Appearance: fairly groomed   Eye Contact:  Good  Speech:  Normal Rate  Volume:  Decreased  Mood:  Remains depressed    Affect:  Constricted,  intermittently tearful   Thought Process:  Goal directed, linear   Orientation:  Full (Time, Place, and Person)  Thought Content: ruminative about stressors , currently not internally preoccupied, no delusions   Suicidal Thoughts:  passive SI, but no plan or intention of hurting self or of suicide, contracts for safety on unit   Homicidal Thoughts:  No  Memory:  Recent and remote grossly intact   Judgement:  Fair   Insight: fair   Psychomotor Activity:  Normal- no psychomotor agitation  Concentration:  Concentration: Good and Attention Span: Good  Recall:  Good  Fund of Knowledge:  Good  Language:  Good  Akathisia:  No  Handed:  Right  AIMS (if indicated):   no abnormal or involuntary movements noted or reactive   Assets:  Desire for Improvement Resilience  ADL's:  Intact  Cognition:  WNL  Sleep:  Number of Hours: 6.75   Assessment - patient continues to present with significant psychiatric symptoms- reports ongoing depression, and presents with a constricted, sad affect, tendency to focus on negative issues, stressors, episodes of tearfulness. Reports ongoing passive SI, but no plan or intention and able to contract for safety on unit. Does report improvement of anxiety symptoms. Tolerating medications well .  As above, does endorse history of bipolarity, with distinct episodes of excessive energy, irritability, racing thoughts, lasting 1-2 days.   Major depressive disorder, recurrent severe without psychotic features [facility])   Treatment Plan Summary: Daily contact with patient to assess and evaluate symptoms and progress in treatment, Medication management, Plan see below and medications as below Encourage group and milieu  participation to work on coping skills and symptom reduction Continue Seroquel  300 mgrs QHS for mood disorder. Continue Topamax 50 mgrs BID for mood disorder, headache prophylaxis  Continue Lamictal  25  mgr BID for mood disorder, bipolar depression Continue Effexor XR  75  mgrs  QDAY for depression, anxiety   Change  Ativan to  0.5 mgrs Q 6 hours PRN for anxiety as needed  Continue Cytomel 25 micrograms QDAY for hypothyroidism Treatment team working on disposition  planning options  Nehemiah Massed, MD  06/24/2016, 1:11 PM   Patient ID: [name], female   DOB: 1990-12-04, 26 y.o.   MRN: [id number]

## 2016-06-24 NOTE — Tx Team (Signed)
Interdisciplinary Treatment and Diagnostic Plan Update  06/24/2016 Time of Session: 11:10 AM  [name] MRN: [id number]  Principal Diagnosis: Major depressive disorder, recurrent severe without psychotic features (HCC)  Secondary Diagnoses: Principal Problem:   Major depressive disorder, recurrent severe without psychotic features (HCC) Active Problems:   Major depressive disorder, single episode, severe without psychosis (HCC)   Current Medications:  Current Facility-Administered Medications  Medication Dose Route Frequency Provider Last Rate Last Dose  . acetaminophen (TYLENOL) tablet 650 mg  650 mg Oral Q6H PRN Charm RingsJamison Y Lord, NP   650 mg at 06/17/16 2119  . alum & mag hydroxide-simeth (MAALOX/MYLANTA) 200-200-20 MG/5ML suspension 30 mL  30 mL Oral Q4H PRN Charm RingsJamison Y Lord, NP      . dextromethorphan-guaiFENesin [facility](MUCINEX DM) 30-600 MG per 12 hr tablet 1 tablet  1 tablet Oral BID Adonis BrookSheila Agustin, NP   1 tablet at 06/24/16 0752  . hydrOXYzine (ATARAX/VISTARIL) tablet 25 mg  25 mg Oral Q6H PRN Sanjuana KavaAgnes I Nwoko, NP   25 mg at 06/22/16 2000  . ibuprofen (ADVIL,MOTRIN) tablet 600 mg  600 mg Oral Q6H PRN Kerry HoughSpencer E Simon, PA-C   600 mg at 06/20/16 1623  . lamoTRIgine (LAMICTAL) tablet 25 mg  25 mg Oral BID Craige CottaFernando A Cobos, MD   25 mg at 06/24/16 0752  . liothyronine (CYTOMEL) tablet 25 mcg  25 mcg Oral Daily Charm RingsJamison Y Lord, NP   25 mcg at 06/24/16 0751  . LORazepam (ATIVAN) tablet 0.5 mg  0.5 mg Oral Q6H PRN Rockey SituFernando A Cobos, MD      . magnesium hydroxide (MILK OF MAGNESIA) suspension 30 mL  30 mL Oral Daily PRN Charm RingsJamison Y Lord, NP      . multivitamin with minerals tablet 1 tablet  1 tablet Oral Daily Craige CottaFernando A Cobos, MD   1 tablet at 06/24/16 0751  . QUEtiapine (SEROQUEL) tablet 300 mg  300 mg Oral QHS Craige CottaFernando A Cobos, MD   300 mg at 06/23/16 2155  . thiamine (VITAMIN B-1) tablet 100 mg  100 mg Oral Daily Craige CottaFernando A Cobos, MD   100 mg at 06/24/16 0752  . topiramate (TOPAMAX) tablet 50 mg   50 mg Oral BID Charm RingsJamison Y Lord, NP   50 mg at 06/24/16 0751  . venlafaxine XR (EFFEXOR-XR) 24 hr capsule 75 mg  75 mg Oral Q breakfast Craige CottaFernando A Cobos, MD   75 mg at 06/24/16 0751    PTA Medications: Prescriptions Prior to Admission  Medication Sig Dispense Refill Last Dose  . liothyronine (CYTOMEL) 25 MCG tablet Take 25 mcg by mouth daily.   06/13/2016 at Unknown time  . Multiple Vitamins-Minerals (MULTIVITAMIN GUMMIES ADULT) CHEW Chew 1 each by mouth 2 (two) times daily.   06/13/2016 at Unknown time  . QUEtiapine (SEROQUEL) 200 MG tablet Take 200 mg by mouth at bedtime.   06/13/2016 at Unknown time  . topiramate (TOPAMAX) 50 MG tablet Take 50 mg by mouth 2 (two) times daily.   06/13/2016 at Unknown time    Treatment Modalities: Medication Management, Group therapy, Case management,  1 to 1 session with clinician, Psychoeducation, Recreational therapy.  Patient Stressors: Financial difficulties Medication change or noncompliance  Patient Strengths: DentistCommunication skills General fund of knowledge Motivation for treatment/growth Physical Health Supportive family/friends  Physician Treatment Plan for Primary Diagnosis: Major depressive disorder, recurrent severe without psychotic features (HCC) Long Term Goal(s): Improvement in symptoms so as ready for discharge  Short Term Goals: Ability to verbalize feelings will improve  Ability to disclose and discuss suicidal ideas Ability to demonstrate self-control will improve Ability to identify and develop effective coping behaviors will improve Ability to maintain clinical measurements within normal limits will improve Ability to verbalize feelings will improve Ability to disclose and discuss suicidal ideas Ability to demonstrate self-control will improve Ability to identify and develop effective coping behaviors will improve  Medication Management: Evaluate patient's response, side effects, and tolerance of medication regimen.  Therapeutic  Interventions: 1 to 1 sessions, Unit Group sessions and Medication administration.  Evaluation of Outcomes: Progressing  Physician Treatment Plan for Secondary Diagnosis: Principal Problem:   Major depressive disorder, recurrent severe without psychotic features (HCC) Active Problems:   Major depressive disorder, single episode, severe without psychosis (HCC)   Long Term Goal(s): Improvement in symptoms so as ready for discharge  Short Term Goals: Ability to verbalize feelings will improve Ability to disclose and discuss suicidal ideas Ability to demonstrate self-control will improve Ability to identify and develop effective coping behaviors will improve Ability to maintain clinical measurements within normal limits will improve Ability to verbalize feelings will improve Ability to disclose and discuss suicidal ideas Ability to demonstrate self-control will improve Ability to identify and develop effective coping behaviors will improve  Medication Management: Evaluate patient's response, side effects, and tolerance of medication regimen.  Therapeutic Interventions: 1 to 1 sessions, Unit Group sessions and Medication administration.  Evaluation of Outcomes: Progressing   RN Treatment Plan for Primary Diagnosis: Major depressive disorder, recurrent severe without psychotic features (HCC) Long Term Goal(s): Knowledge of disease and therapeutic regimen to maintain health will improve  Short Term Goals: Ability to verbalize feelings will improve, Ability to disclose and discuss suicidal ideas and Ability to identify and develop effective coping behaviors will improve  Medication Management: RN will administer medications as ordered by provider, will assess and evaluate patient's response and provide education to patient for prescribed medication. RN will report any adverse and/or side effects to prescribing provider.  Therapeutic Interventions: 1 on 1 counseling sessions,  Psychoeducation, Medication administration, Evaluate responses to treatment, Monitor vital signs and CBGs as ordered, Perform/monitor CIWA, COWS, AIMS and Fall Risk screenings as ordered, Perform wound care treatments as ordered.  Evaluation of Outcomes: Progressing   LCSW Treatment Plan for Primary Diagnosis: Major depressive disorder, recurrent severe without psychotic features (HCC) Long Term Goal(s): Safe transition to appropriate next level of care at discharge, Engage patient in therapeutic group addressing interpersonal concerns.  Short Term Goals: Engage patient in aftercare planning with referrals and resources, Identify triggers associated with mental health/substance abuse issues and Increase skills for wellness and recovery  Therapeutic Interventions: Assess for all discharge needs, 1 to 1 time with Social worker, Explore available resources and support systems, Assess for adequacy in community support network, Educate family and significant other(s) on suicide prevention, Complete Psychosocial Assessment, Interpersonal group therapy.  Evaluation of Outcomes: Progressing   Progress in Treatment: Attending groups: Yes Participating in groups: Minimally Taking medication as prescribed: Yes, MD continues to assess for medication changes as needed Toleration medication: Yes, no side effects reported at this time Family/Significant other contact made: No, Pt declines Patient understands diagnosis: Developing insight Discussing patient identified problems/goals with staff: Yes Medical problems stabilized or resolved: Yes Denies suicidal/homicidal ideation: No, endorses passive SI Issues/concerns per patient self-inventory: None Other: N/A  New problem(s) identified: None identified at this time.   New Short Term/Long Term Goal(s): None identified at this time.   Discharge Plan or Barriers: Pt will return  home and follow-up with outpatient services.   Reason for Continuation of  Hospitalization: Anxiety Depression Medication stabilization Suicidal ideation  Estimated Length of Stay: 2-3 days  Attendees: Patient: 06/24/2016  11:10 AM  Physician: Dr. Jama Flavors 06/24/2016  11:10 AM  Nursing: Joslyn Devon, Leighton Parody, RN 06/24/2016  11:10 AM  RN Care Manager: Onnie Boer, RN 06/24/2016  11:10 AM  Social Worker: Vernie Shanks, LCSW; Donnelly Stager, LCSWA 06/24/2016  11:10 AM  Recreational Therapist:  06/24/2016  11:10 AM  Other: Armandina Stammer, NP; Patria Mane, NP 06/24/2016  11:10 AM  Other:  06/24/2016  11:10 AM  Other: 06/24/2016  11:10 AM    Scribe for Treatment Team: Verdene Lennert, LCSW 06/24/2016 11:10 AM

## 2016-06-25 MED ORDER — VENLAFAXINE HCL ER 37.5 MG PO CP24
112.5000 mg | ORAL_CAPSULE | Freq: Every day | ORAL | Status: DC
Start: 2016-06-26 — End: 2016-06-26
  Administered 2016-06-26: 112.5 mg via ORAL
  Filled 2016-06-25 (×3): qty 3

## 2016-06-25 MED ORDER — QUETIAPINE FUMARATE 400 MG PO TABS
400.0000 mg | ORAL_TABLET | Freq: Every day | ORAL | Status: DC
Start: 2016-06-25 — End: 2016-06-26
  Administered 2016-06-25: 400 mg via ORAL
  Filled 2016-06-25: qty 2
  Filled 2016-06-25 (×3): qty 1

## 2016-06-25 NOTE — Progress Notes (Signed)
[facility] Progress Note  06/25/2016 1:42 PM Tina PaoKaitlyn L Gates  MRN:  [id number] Subjective:She reports ongoing significant , severe symptoms, but states that she feels her depression is improving slightly, whereas her anxiety remains severe. She states she is making an active effort to be more visible in milieu and more active in groups , but states it is hard for her to talk about her issues without crying or having severe anxiety. She states she is sleeping better. Of note, patient continues to endorse passive SI, but denies any active plan or intention to hurt self or of suicide and identifies her children as protective factor .   Objective :Patient seen and chart reviewed.Discussed patient with treatment team.  She remains depressed, ruminative about her stressors, and in particular about her job and her fear she may lose it . She does endorse some improvement in mood, but states it is modest thus far. Reports ongoing anxiety symptoms, ruminations as above . No disruptive or agitated behaviors on unit, going to groups, and as above, making effort to be less isolative and more participative in groups.  Denies medication side effects- no akathisia noted or reported . She states she feels medications are helping " a little ".   Principal Problem: Major depressive disorder, recurrent severe without psychotic features (HCC) Diagnosis:   Patient Active Problem List   Diagnosis Date Noted  . Major depressive disorder, recurrent severe without psychotic features (HCC) [F33.2] 06/15/2016  . Major depressive disorder, single episode, severe without psychosis (HCC) [F32.2] 06/15/2016  . Normal labor and delivery [O80] 11/23/2010   Total Time spent with patient:20 minutes   Past psychiatric hx: Please see H&P.   Past Medical History:  Past Medical History:  Diagnosis Date  . Anxiety   . Bipolar 1 disorder (HCC)   . Costochondritis   . Depression   . History of kidney stones   . PONV  (postoperative nausea and vomiting)     Past Surgical History:  Procedure Laterality Date  . APPENDECTOMY    . CHOLECYSTECTOMY    . LAPAROSCOPY N/A 04/16/2015   Procedure: LAPAROSCOPY DIAGNOSTIC;  Surgeon: Levi AlandMark E Anderson, MD;  Location: [facility];  Service: Gynecology;  Laterality: N/A;   Family History: Please see H&P.  Family History  Problem Relation Age of Onset  . Alcohol abuse Father   . Depression Father   . Mental illness Father   . Drug abuse Father   . Depression Mother   . Birth defects Sister   . Depression Maternal Grandmother   . Heart disease Maternal Grandmother   . Hypertension Maternal Grandmother   . Depression Maternal Grandfather   . Heart disease Maternal Grandfather   . Hypertension Maternal Grandfather   . Depression Paternal Grandmother   . Heart disease Paternal Grandmother   . Hypertension Paternal Grandmother   . Depression Paternal Grandfather   . Heart disease Paternal Grandfather   . Cancer Paternal Grandfather   . Alcohol abuse Paternal Grandfather   . Hypertension Paternal Grandfather    Social History: Please see H&P.  History  Alcohol Use  . Yes     History  Drug Use No    Social History   Social History  . Marital status: Single    Spouse name: N/A  . Number of children: N/A  . Years of education: N/A   Social History Main Topics  . Smoking status: Never Smoker  . Smokeless tobacco: Never Used  . Alcohol use Yes  .  Drug use: No  . Sexual activity: Yes    Birth control/ protection: None   Other Topics Concern  . None   Social History Narrative  . None   Additional Social History:    Pain Medications: See MAR  Prescriptions: See MAR Over the Counter: See MAR History of alcohol / drug use?: Yes Name of Substance 1: Alcohol 1 - Age of First Use: unknown 1 - Frequency: "I have only drank 3 times in my life" 1 - Duration: since 23 1 - Last Use / Amount: yesterday  Sleep: improving   Appetite: erratic  Current  Medications: Current Facility-Administered Medications  Medication Dose Route Frequency Provider Last Rate Last Dose  . acetaminophen (TYLENOL) tablet 650 mg  650 mg Oral Q6H PRN Charm Rings, NP   650 mg at 06/17/16 2119  . alum & mag hydroxide-simeth (MAALOX/MYLANTA) 200-200-20 MG/5ML suspension 30 mL  30 mL Oral Q4H PRN Charm Rings, NP      . dextromethorphan-guaiFENesin [facility] DM) 30-600 MG per 12 hr tablet 1 tablet  1 tablet Oral BID Adonis Brook, NP   1 tablet at 06/24/16 1712  . hydrOXYzine (ATARAX/VISTARIL) tablet 25 mg  25 mg Oral Q6H PRN Sanjuana Kava, NP   25 mg at 06/22/16 2000  . ibuprofen (ADVIL,MOTRIN) tablet 600 mg  600 mg Oral Q6H PRN Kerry Hough, PA-C   600 mg at 06/20/16 1623  . lamoTRIgine (LAMICTAL) tablet 25 mg  25 mg Oral BID Craige Cotta, MD   25 mg at 06/25/16 0811  . liothyronine (CYTOMEL) tablet 25 mcg  25 mcg Oral Daily Charm Rings, NP   25 mcg at 06/25/16 1610  . LORazepam (ATIVAN) tablet 0.5 mg  0.5 mg Oral Q6H PRN Craige Cotta, MD   0.5 mg at 06/25/16 0815  . magnesium hydroxide (MILK OF MAGNESIA) suspension 30 mL  30 mL Oral Daily PRN Charm Rings, NP      . multivitamin with minerals tablet 1 tablet  1 tablet Oral Daily Craige Cotta, MD   1 tablet at 06/25/16 [phone]  . QUEtiapine (SEROQUEL) tablet 300 mg  300 mg Oral QHS Craige Cotta, MD   300 mg at 06/24/16 2254  . thiamine (VITAMIN B-1) tablet 100 mg  100 mg Oral Daily Craige Cotta, MD   100 mg at 06/25/16 0811  . topiramate (TOPAMAX) tablet 50 mg  50 mg Oral BID Charm Rings, NP   50 mg at 06/25/16 5409  . venlafaxine XR (EFFEXOR-XR) 24 hr capsule 75 mg  75 mg Oral Q breakfast Craige Cotta, MD   75 mg at 06/25/16 8119    Lab Results:  No results found for this or any previous visit (from the past 48 hour(s)).  Blood Alcohol level:  Lab Results  Component Value Date   ETH <5 06/14/2016    Metabolic Disorder Labs: Lab Results  Component Value Date   HGBA1C 4.7  (L) 06/17/2016   MPG 88 06/17/2016   Lab Results  Component Value Date   PROLACTIN 60.7 (H) 06/17/2016   Lab Results  Component Value Date   CHOL 118 06/17/2016   TRIG 100 06/17/2016   HDL 33 (L) 06/17/2016   CHOLHDL 3.6 06/17/2016   VLDL 20 06/17/2016   LDLCALC 65 06/17/2016    Physical Findings: AIMS: Facial and Oral Movements Muscles of Facial Expression: None, normal Lips and Perioral Area: None, normal Jaw: None, normal Tongue: None,  normal,Extremity Movements Upper (arms, wrists, hands, fingers): None, normal Lower (legs, knees, ankles, toes): None, normal, Trunk Movements Neck, shoulders, hips: None, normal, Overall Severity Severity of abnormal movements (highest score from questions above): None, normal Incapacitation due to abnormal movements: None, normal Patient's awareness of abnormal movements (rate only patient's report): No Awareness, Dental Status Current problems with teeth and/or dentures?: No Does patient usually wear dentures?: No  CIWA:  CIWA-Ar Total: 0 COWS:     Musculoskeletal: Strength & Muscle Tone: within normal limits Gait & Station: normal Patient leans: N/A  Psychiatric Specialty Exam: Physical Exam  Nursing note and vitals reviewed.   Review of Systems  Psychiatric/Behavioral: Positive for depression, hallucinations and suicidal ideas. The patient is nervous/anxious and has insomnia.   All other systems reviewed and are negative.  denies chest pain, no shortness of breath, no nausea or vomiting   Blood pressure (!) 119/54, pulse 86, temperature 97.9 F (36.6 C), temperature source Oral, resp. rate 18, height 5\' 4"  (1.626 m), weight 69.4 kg (153 lb), last menstrual period 06/12/2016, SpO2 100 %, unknown if currently breastfeeding.Body mass index is 26.26 kg/m.  General Appearance: grooming better than on admission  Eye Contact:  Good  Speech:  Normal Rate  Volume: normal   Mood:  Still significantly depressed, but reports some  improvement   Affect:  Remains sad, constricted, anxious   Thought Process:  Goal directed, associations intact   Orientation:  Full (Time, Place, and Person)  Thought Content: (+) ruminations, mainly about work and financial stressors , no hallucinations,  no delusions   Suicidal Thoughts:  Continues to endorse passive SI, but denies any  plan or intention of hurting self or of suicide, contracts for safety on unit , establishes her love for her children as a protective factor   Homicidal Thoughts:  No denies homicidal or violent ideations  Memory:  Recent and remote grossly intact   Judgement: improving   Insight: improving  Psychomotor Activity:  Normal  Concentration:  Concentration: Good and Attention Span: Good  Recall:  Good  Fund of Knowledge:  Good  Language:  Good  Akathisia:  Negative  Handed:  Right  AIMS (if indicated):   no abnormal or involuntary movements noted or reactive   Assets:  Desire for Improvement Resilience  ADL's:  Intact  Cognition:  WNL  Sleep:  Number of Hours: 6.25   Assessment - patient presents with some , partial, improvement in mood, although does remain severely depressed . Anxiety symptoms continue to be reported as severe. There is some improvement - speech is increased, eye contact seems improved, and today more categorically denies any suicidal ideations, able to identify her love for children as a protective factor from hurting self . She is tolerating medications well thus far. We have reviewed clinical response lag with antidepressants- Effexor XR is new trial for her . Marland Kitchen   Major depressive disorder, recurrent severe without psychotic features [facility])   Treatment Plan Summary: Plan and medication management reviewed 2/15 as below . Daily contact with patient to assess and evaluate symptoms and progress in treatment, Medication management, Plan see below and medications as below Encourage group and milieu participation to work on coping skills  and symptom reduction Increase Seroquel  To 400   mgrs QHS for mood disorder. Continue Topamax 50 mgrs BID for mood disorder, headache prophylaxis  Continue Lamictal  25  mgr BID for mood disorder, bipolar depression Increase  Effexor XR  To 112.5  mgrs  QDAY for depression, anxiety   Change  Ativan to  0.5 mgrs Q 6 hours PRN for anxiety as needed  Continue Cytomel 25 micrograms QDAY for hypothyroidism Treatment team working on disposition planning options  Nehemiah Massed, MD  06/25/2016, 1:42 PM   Patient ID: [name], female   DOB: [birth year], 26 y.o.   MRN: [id number] Patient ID: LAURELL COALSON, female   DOB: June 27, 1990, 26 y.o.   MRN: [id number]

## 2016-06-25 NOTE — Progress Notes (Signed)
Pt attend group. Her goal was a 6. Her goal not to be so hard on herself. She did not meet her goal. She talked to the nurse and the doctor today.

## 2016-06-25 NOTE — B[facility] Group Notes (Signed)
[facility] Mental Health Association Group Therapy 06/25/2016 1:15pm  Type of Therapy: Mental Health Association Presentation  Participation Level: Active  Participation Quality: Attentive  Affect: Appropriate  Cognitive: Oriented  Insight: Developing/Improving  Engagement in Therapy: Engaged  Modes of Intervention: Discussion, Education and Socialization  Summary of Progress/Problems: Mental Health Association (MHA) Speaker came to talk about his personal journey with substance abuse and addiction. The pt processed ways by which to relate to the speaker. MHA speaker provided handouts and educational information pertaining to groups and services offered by the [facility]. Pt was engaged in speaker's presentation and was receptive to resources provided. Pt was alert and attentive for the duration of group. Pt seemed to identify with the speaker and was interested in the topic.   Vito BackersLynn B. Beverely PaceBryant, MSW, [facility] 06/25/2016 2:32 PM

## 2016-06-25 NOTE — Progress Notes (Addendum)
Nursing Progress Note: 7p-7a D: Pt currently presents with a sad/depressed affect and behavior. Pt states "I just need to get my life together. My boss has been threatening to fire me even after I tell her I'm in the hospital. It's really stressful. That job stands between me and homelessness." Interacting appropriately with milieu. Pt reports good sleep with current medication regimen.   A: Pt provided with medications per providers orders. Pt's labs and vitals were monitored throughout the night. Pt supported emotionally and encouraged to express concerns and questions. Pt educated on medications.  R: Pt's safety ensured with 15 minute and environmental checks. Pt currently denies HI and A/V hallucinations and endorses passive SI "I think to hurt myself when things get tough" . Pt verbally contracts to seek staff if SI/HI or A/VH occurs and to consult with staff before acting on any harmful thoughts. Will continue to monitor.

## 2016-06-25 NOTE — Progress Notes (Signed)
D: Pt presents with a sad affect and depressed mood. Pt reported increased depression and anxiety this morning. Pt rated depression 8/10. Anxiety 9/10. Pt requested antianxiety medication this morning. Writer administered Ativan to pt for anxiety. Pt reported passive suicidal thoughts with no plan or intent. Pt verbally contracts for safety. Pt reported that she slept better last night for the first time since admit. A: Medications reviewed with pt. Medications administered as ordered per MD. Verbal support provided. Pt encouraged to attend groups. 15 minute checks performed for safety. R: Pt receptive to tx.

## 2016-06-26 ENCOUNTER — Encounter: Payer: Self-pay | Admitting: *Deleted

## 2016-06-26 ENCOUNTER — Inpatient Hospital Stay
Admission: EM | Admit: 2016-06-26 | Discharge: 2016-07-03 | DRG: 885 | Disposition: A | Discharge status: Home / Self Care | Payer: 59 | Source: Intra-hospital | Attending: Psychiatry | Admitting: Psychiatry

## 2016-06-26 ENCOUNTER — Inpatient Hospital Stay: Payer: 59

## 2016-06-26 DIAGNOSIS — G43909 Migraine, unspecified, not intractable, without status migrainosus: Secondary | ICD-10-CM | POA: Diagnosis present

## 2016-06-26 DIAGNOSIS — Z888 Allergy status to other drugs, medicaments and biological substances status: Secondary | ICD-10-CM

## 2016-06-26 DIAGNOSIS — Z9049 Acquired absence of other specified parts of digestive tract: Secondary | ICD-10-CM

## 2016-06-26 DIAGNOSIS — Z8249 Family history of ischemic heart disease and other diseases of the circulatory system: Secondary | ICD-10-CM

## 2016-06-26 DIAGNOSIS — Z809 Family history of malignant neoplasm, unspecified: Secondary | ICD-10-CM | POA: Diagnosis not present

## 2016-06-26 DIAGNOSIS — F332 Major depressive disorder, recurrent severe without psychotic features: Secondary | ICD-10-CM | POA: Diagnosis present

## 2016-06-26 DIAGNOSIS — F319 Bipolar disorder, unspecified: Secondary | ICD-10-CM

## 2016-06-26 DIAGNOSIS — Z811 Family history of alcohol abuse and dependence: Secondary | ICD-10-CM

## 2016-06-26 DIAGNOSIS — F3130 Bipolar disorder, current episode depressed, mild or moderate severity, unspecified: Secondary | ICD-10-CM

## 2016-06-26 DIAGNOSIS — F4310 Post-traumatic stress disorder, unspecified: Secondary | ICD-10-CM | POA: Diagnosis present

## 2016-06-26 DIAGNOSIS — R200 Anesthesia of skin: Secondary | ICD-10-CM

## 2016-06-26 DIAGNOSIS — F419 Anxiety disorder, unspecified: Secondary | ICD-10-CM | POA: Diagnosis present

## 2016-06-26 DIAGNOSIS — Z818 Family history of other mental and behavioral disorders: Secondary | ICD-10-CM

## 2016-06-26 DIAGNOSIS — R45851 Suicidal ideations: Secondary | ICD-10-CM | POA: Diagnosis present

## 2016-06-26 DIAGNOSIS — Z813 Family history of other psychoactive substance abuse and dependence: Secondary | ICD-10-CM | POA: Diagnosis not present

## 2016-06-26 HISTORY — DX: Bipolar disorder, unspecified: F31.9

## 2016-06-26 MED ORDER — ACETAMINOPHEN 325 MG PO TABS: 650.0000 mg | ORAL_TABLET | Freq: Four times a day (QID) | ORAL | Status: DC | PRN

## 2016-06-26 MED ORDER — OCUVITE-LUTEIN PO CAPS
1.0000 | ORAL_CAPSULE | Freq: Every day | ORAL | Status: DC
  Administered 2016-06-27 – 2016-07-03 (×7): 1 via ORAL
  Filled 2016-06-26 (×7): qty 1

## 2016-06-26 MED ORDER — QUETIAPINE FUMARATE 400 MG PO TABS: 400.0000 mg | ORAL_TABLET | Freq: Every day | ORAL | Status: DC

## 2016-06-26 MED ORDER — MAGNESIUM HYDROXIDE 400 MG/5ML PO SUSP: 30.0000 mL | Freq: Every day | ORAL | 0 refills | Status: DC | PRN

## 2016-06-26 MED ORDER — THIAMINE HCL 100 MG PO TABS: 100.0000 mg | ORAL_TABLET | Freq: Every day | ORAL | Status: DC

## 2016-06-26 MED ORDER — LORAZEPAM 0.5 MG PO TABS: 0.5000 mg | ORAL_TABLET | Freq: Four times a day (QID) | ORAL | 0 refills | Status: DC | PRN

## 2016-06-26 MED ORDER — TRAZODONE HCL 100 MG PO TABS: 100.0000 mg | ORAL_TABLET | Freq: Every evening | ORAL | Status: DC | PRN

## 2016-06-26 MED ORDER — MAGNESIUM HYDROXIDE 400 MG/5ML PO SUSP: 30.0000 mL | Freq: Every day | ORAL | Status: DC | PRN

## 2016-06-26 MED ORDER — TOPIRAMATE 25 MG PO TABS
50.0000 mg | ORAL_TABLET | Freq: Two times a day (BID) | ORAL | Status: DC
  Administered 2016-06-26 – 2016-07-03 (×14): 50 mg via ORAL
  Filled 2016-06-26 (×14): qty 2

## 2016-06-26 MED ORDER — VENLAFAXINE HCL ER 37.5 MG PO CP24: 112.5000 mg | ORAL_CAPSULE | Freq: Every day | ORAL | Status: DC

## 2016-06-26 MED ORDER — LIOTHYRONINE SODIUM 25 MCG PO TABS: 25.0000 ug | ORAL_TABLET | Freq: Every day | ORAL | Status: DC

## 2016-06-26 MED ORDER — ACETAMINOPHEN 325 MG PO TABS
650.0000 mg | ORAL_TABLET | Freq: Four times a day (QID) | ORAL | Status: DC | PRN
Start: 2016-06-26 — End: 2016-07-03
  Administered 2016-06-29 – 2016-07-01 (×2): 650 mg via ORAL
  Filled 2016-06-26 (×3): qty 2

## 2016-06-26 MED ORDER — VENLAFAXINE HCL ER 75 MG PO CP24
150.0000 mg | ORAL_CAPSULE | Freq: Every day | ORAL | Status: DC
  Administered 2016-06-27 – 2016-07-03 (×7): 150 mg via ORAL
  Filled 2016-06-26 (×7): qty 2

## 2016-06-26 MED ORDER — HYDROXYZINE HCL 25 MG PO TABS: 25.0000 mg | ORAL_TABLET | Freq: Four times a day (QID) | ORAL | 0 refills | Status: DC | PRN

## 2016-06-26 MED ORDER — ALUM & MAG HYDROXIDE-SIMETH 200-200-20 MG/5ML PO SUSP: 30.0000 mL | ORAL | 0 refills | Status: DC | PRN

## 2016-06-26 MED ORDER — HYDROXYZINE HCL 25 MG PO TABS
25.0000 mg | ORAL_TABLET | Freq: Three times a day (TID) | ORAL | Status: DC | PRN
  Administered 2016-06-28 – 2016-07-01 (×2): 25 mg via ORAL
  Filled 2016-06-26 (×2): qty 1

## 2016-06-26 MED ORDER — IBUPROFEN 600 MG PO TABS: 600.0000 mg | ORAL_TABLET | Freq: Four times a day (QID) | ORAL | 0 refills | Status: DC | PRN

## 2016-06-26 MED ORDER — LIOTHYRONINE SODIUM 25 MCG PO TABS
25.0000 ug | ORAL_TABLET | Freq: Every day | ORAL | Status: DC
  Administered 2016-06-27 – 2016-07-03 (×7): 25 ug via ORAL
  Filled 2016-06-26 (×8): qty 1

## 2016-06-26 MED ORDER — ALUM & MAG HYDROXIDE-SIMETH 200-200-20 MG/5ML PO SUSP: 30.0000 mL | ORAL | Status: DC | PRN

## 2016-06-26 MED ORDER — MULTIVITAMIN GUMMIES ADULT PO CHEW: 1.0000 | CHEWABLE_TABLET | Freq: Two times a day (BID) | ORAL | Status: DC

## 2016-06-26 MED ORDER — LAMOTRIGINE 25 MG PO TABS: 25.0000 mg | ORAL_TABLET | Freq: Two times a day (BID) | ORAL | Status: DC

## 2016-06-26 MED ORDER — TOPIRAMATE 50 MG PO TABS: 50.0000 mg | ORAL_TABLET | Freq: Two times a day (BID) | ORAL | Status: DC

## 2016-06-26 MED ORDER — QUETIAPINE FUMARATE 200 MG PO TABS
400.0000 mg | ORAL_TABLET | Freq: Every day | ORAL | Status: DC
  Administered 2016-06-26: 400 mg via ORAL
  Filled 2016-06-26: qty 2

## 2016-06-26 NOTE — Progress Notes (Signed)
D: Pt denies HI/AVH. Pt is passive SI, however does contract for safety. Pt goal for today is not to be so hard on herself. A: Pt was offered support and encouragement. Pt was given scheduled medications. Pt was encourage to attend groups. Q 15 minute checks were done for safety.  R:Pt attends groups and interacts well with peers and staff. Pt is taking medication. Pt has no complaints.Pt receptive to treatment and safety maintained on unit.

## 2016-06-26 NOTE — BHH Suicide Risk Assessment (Signed)
[facility] Discharge Suicide Risk Assessment   Principal Problem: Major depressive disorder, recurrent severe without psychotic features [facility]) Discharge Diagnoses:  Patient Active Problem List   Diagnosis Date Noted  . Major depressive disorder, recurrent severe without psychotic features (HCC) [F33.2] 06/15/2016  . Major depressive disorder, single episode, severe without psychosis (HCC) [F32.2] 06/15/2016  . Normal labor and delivery [O80] 11/23/2010    Total Time spent with patient: 30 minutes  Musculoskeletal: Strength & Muscle Tone: within normal limits Gait & Station: normal Patient leans: N/A  Psychiatric Specialty Exam: ROS no headache, no chest pain, no shortness of breath, no vomiting, no rash, no fever   Blood pressure 122/77, pulse 93, temperature 98 F (36.7 C), temperature source Oral, resp. rate 16, height 5\' 4"  (1.626 m), weight 69.4 kg (153 lb), last menstrual period 06/12/2016, SpO2 100 %, unknown if currently breastfeeding.Body mass index is 26.26 kg/m.  General Appearance: better groomed   Eye Contact::  Fair  Speech:  Normal Rate409  Volume:  Decreased  Mood:  Depressed  Affect:  remains depressed, constricted, anxious at times   Thought Process:  Linear  Orientation:  Full (Time, Place, and Person)  Thought Content:  no hallucinations, no delusions , not internally preoccupied   Suicidal Thoughts:  Yes.  without intent/plan- reports passive thoughts of wanting to die but denies any plan or intention of hurting herself or of suicide , and identifies her children as a protective factor against suicide  Homicidal Thoughts:  No  Memory:  recent and remote grossly intact   Judgement:  Other:  improving   Insight:  Present  Psychomotor Activity:  Decreased  Concentration:  Good  Recall:  Good  Fund of Knowledge:Good  Language: Good  Akathisia:  Negative  Handed:  Right  AIMS (if indicated):     Assets:  Communication Skills Desire for Improvement Resilience   Sleep:  Number of Hours: 6.75  Cognition: WNL  ADL's:  Intact   Mental Status Per Nursing Assessment::   On Admission:  Self-harm thoughts  Demographic Factors:  26 year old female, employed  has two children   Loss Factors: Chronic depression, stressful job , financial difficulties   Historical Factors: History of depression, has been diagnosed with Bipolar Disorder in the past, history of suicide attempt in 2013.  Of note, states that she has had prior trials of Celexa, Trintellix, Latuda, Prozac, Zoloft, Wellbutrin, and does not feel any of these trials helped significantly   Risk Reduction Factors:   Responsible for children under 24 years of age, Sense of responsibility to family and Employed  Continued Clinical Symptoms:  Continues to present depressed, sad, with neuro-vegetative symptoms such as low energy, fair appetite. She has passive SI but denies any active suicidal or self injurious ideations . No psychotic symptoms.  Denies medication side effects at this time. Behavior on unit calm and in good control . Denies medication side effects.     Cognitive Features That Contribute To Risk:  No gross cognitive deficits noted upon discharge. Is alert , attentive, and oriented x 3   Suicide Risk:  Moderate:  Frequent suicidal ideation with limited intensity, and duration, some specificity in terms of plans, no associated intent, good self-control, limited dysphoria/symptomatology, some risk factors present, and identifiable protective factors, including available and accessible social support.  Follow-up Information    [facility] Follow up on 07/07/2016.   Why:  at 1:00pm with Cam Hines for your initial assessment. Medication management appointment scheduled  for 3/21 at 9:15am with Dr. Lenoria FarrierAkers. Contact information: [address]  [place] [address] [phone]       [facility].   Why:  at 11:00am for your intial  intake. Please arrive 20 minutes prior to your appointment.  Contact information: [address] [place], [place] [address] Phone: [phone] Fax: [phone]          Plan Of Care/Follow-up recommendations:  Activity:  as tolerated  Diet:  Regular Tests:  NA Other:  See below Based on persistent depression in spite of medication management and on prior history of poor response to medications , ECT has been considered as a next appropriate step. Patient is in agreement. [facility] purpose of starting inpatient ECT by Dr. Toni Amendlapacs.  Nehemiah MassedOBOS, Senora Lacson, MD 06/26/2016, 3:25 PM

## 2016-06-26 NOTE — Progress Notes (Signed)
Per request of MD and with Pt's consent, CS[facility]. [facility] and [facility] begin [facility] process. CSW awaiting return call regarding review of referral.  Vernie ShanksLauren Tobias Avitabile, LCSW Clinical Social Work [phone]

## 2016-06-26 NOTE — Progress Notes (Signed)
  [facility] :  Will you be returning to the same living situation after discharge:  No. [facility] At discharge, do you have transportation home?: Yes,  [place] to transport Do you have the ability to pay for your medications: Yes,  [facility]  Release of information consent forms completed and in the chart;  Patient's signature needed at discharge.  Patient to Follow up at: Follow-up Information    [facility] Follow up on 07/07/2016.   Why:  at 1:00pm with Cam Hines for your initial assessment. Medication management appointment scheduled for 3/21 at 9:15am with Dr. Lenoria FarrierAkers. Contact information: [address] [place] [address] [phone]       [facility]2016.   Why:  at 11:00am for your intial intake. Please arrive 20 minutes prior to your appointment.  Contact information: [address] [place], [place] [address] Phone: [phone] Fax: [phone]          Next level of care provider [facility] Link:no  Safety Planning and Suicide Prevention discussed: Yes,  with Pt; declines family contact  Have you used any form of tobacco in the last 30 days? (Cigarettes, Smokeless Tobacco, Cigars, and/or Pipes): No  Has patient been referred to the Quitline?: N/A patient is not a smoker  Patient has been referred for addiction treatment: N/A  Verdene LennertLauren C Markevius Trombetta 06/26/2016, 3:35 PM

## 2016-06-26 NOTE — Progress Notes (Addendum)
Patient ID: [name], female   DOB: October 30, 1990, 26 y.o.   MRN: [id number]  DAR: Pt. Endorses SI and HI however does not give the name or any information about her homicidal ideations. She is able to contract for safety. She denies auditory and visual hallucinations. She reports sleep is poor, appetite is poor, energy level is low, and concentration is good. He rates depression 9/10, hopelessness 7/10, and anxiety 8/10. She received PRN Ativan this morning for anxiety which provided some relief. Support and encouragement provided to the patient. Scheduled medications administered to patient per physician's orders. Patient is minimal with this Clinical research associatewriter but cooperative. She is seen in the milieu intermittently interacting with her peers. Q15 minute checks are maintained for safety.

## 2016-06-26 NOTE — Progress Notes (Signed)
Patient ID: [name], female   DOB: 05-Apr-1991, 26 y.o.   MRN: [id number]  Writer called report [facility] James[facility]Phyllis [facility].

## 2016-06-26 NOTE — H&P (Signed)
[name] is an 26 y.o. female.   Chief Complaint: "My depression is bad" HPI: Patient interviewed. Chart reviewed including notes from behavioral health Hospital. 26 year old woman transferred to our hospital for ECT treatment. Patient reports she has had problems with her mood going back many years. Current episode of depression has been particularly bad for several months. It came after a very brief manic-like.. Prior to coming into the hospital she had not been seeing a psychiatrist but had been on medication management. She has been at behavioral [facility] since February 5 with some medication adjustment but minimal to no improvement in her symptoms. Patient reports depressed mood almost constantly, generalized anxiety, poor sleep and poor energy, hopelessness, passive suicidal thoughts. She states she is not having any hallucinations now because she is taking a higher dose of Seroquel which keeps the psychotic symptoms under control. Denies that she had been abusing any alcohol or drugs. Doesn't indicate at this point any other specific stressors. Patient is agreeable to a plan for ECT treatment.  Past Medical History:  Diagnosis Date  . Anxiety   . Bipolar 1 disorder (HCC)   . Costochondritis   . Depression   . History of kidney stones   . PONV (postoperative nausea and vomiting)     Past Surgical History:  Procedure Laterality Date  . APPENDECTOMY    . CHOLECYSTECTOMY    . LAPAROSCOPY N/A 04/16/2015   Procedure: LAPAROSCOPY DIAGNOSTIC;  Surgeon: Levi AlandMark E Anderson, MD;  Location: [facility];  Service: Gynecology;  Laterality: N/A;    Family History  Problem Relation Age of Onset  . Alcohol abuse Father   . Depression Father   . Mental illness Father   . Drug abuse Father   . Depression Mother   . Birth defects Sister   . Depression Maternal Grandmother   . Heart disease Maternal Grandmother   . Hypertension Maternal Grandmother   . Depression Maternal Grandfather   .  Heart disease Maternal Grandfather   . Hypertension Maternal Grandfather   . Depression Paternal Grandmother   . Heart disease Paternal Grandmother   . Hypertension Paternal Grandmother   . Depression Paternal Grandfather   . Heart disease Paternal Grandfather   . Cancer Paternal Grandfather   . Alcohol abuse Paternal Grandfather   . Hypertension Paternal Grandfather    Social History:  reports that she has never smoked. She has never used smokeless tobacco. She reports that she drinks alcohol. She reports that she does not use drugs.  Allergies:  Allergies  Allergen Reactions  . Phenergan [Promethazine Hcl] Other (See Comments)    Reaction:  Shaking     Medications Prior to Admission  Medication Sig Dispense Refill  . acetaminophen (TYLENOL) 325 MG tablet Take 2 tablets (650 mg total) by mouth every 6 (six) hours as needed for mild pain.    Marland Kitchen. alum & mag hydroxide-simeth (MAALOX/MYLANTA) 200-200-20 MG/5ML suspension Take 30 mLs by mouth every 4 (four) hours as needed for indigestion. 355 mL 0  . hydrOXYzine (ATARAX/VISTARIL) 25 MG tablet Take 1 tablet (25 mg total) by mouth every 6 (six) hours as needed (anxiety / agitateion). 30 tablet 0  . ibuprofen (ADVIL,MOTRIN) 600 MG tablet Take 1 tablet (600 mg total) by mouth every 6 (six) hours as needed for fever, headache or moderate pain. 30 tablet 0  . lamoTRIgine (LAMICTAL) 25 MG tablet Take 1 tablet (25 mg total) by mouth 2 (two) times daily. For mood stabilization    .  liothyronine (CYTOMEL) 25 MCG tablet Take 1 tablet (25 mcg total) by mouth daily. For Hypothyroidism.    Marland Kitchen LORazepam (ATIVAN) 0.5 MG tablet Take 1 tablet (0.5 mg total) by mouth every 6 (six) hours as needed for anxiety. 30 tablet 0  . magnesium hydroxide (MILK OF MAGNESIA) 400 MG/5ML suspension Take 30 mLs by mouth daily as needed for mild constipation. 360 mL 0  . Multiple Vitamins-Minerals (MULTIVITAMIN GUMMIES ADULT) CHEW Chew 1 each by mouth 2 (two) times daily.  Vitamin replacement    . QUEtiapine (SEROQUEL) 400 MG tablet Take 1 tablet (400 mg total) by mouth at bedtime. For mood control    . [START ON 06/27/2016] thiamine 100 MG tablet Take 1 tablet (100 mg total) by mouth daily. For low thiamine    . topiramate (TOPAMAX) 50 MG tablet Take 1 tablet (50 mg total) by mouth 2 (two) times daily. For mood stabilization    . [START ON 06/27/2016] venlafaxine XR (EFFEXOR-XR) 37.5 MG 24 hr capsule Take 3 capsules (112.5 mg total) by mouth daily with breakfast. For depression      No results found for this or any previous visit (from the past 48 hour(s)). No results found.  Review of Systems  Constitutional: Negative.   HENT: Negative.   Eyes: Negative.   Respiratory: Negative.   Cardiovascular: Negative.   Gastrointestinal: Negative.   Musculoskeletal: Negative.   Skin: Negative.   Neurological: Negative.   Psychiatric/Behavioral: Positive for depression and suicidal ideas. Negative for hallucinations, memory loss and substance abuse. The patient is nervous/anxious and has insomnia.     Blood pressure 128/75, pulse 99, temperature 98.8 F (37.1 C), temperature source Oral, resp. rate 18, height 5\' 4"  (1.626 m), last menstrual period 06/12/2016, SpO2 100 %, unknown if currently breastfeeding. Physical Exam  Nursing note and vitals reviewed. Constitutional: She appears well-developed and well-nourished.  HENT:  Head: Normocephalic and atraumatic.  Eyes: Conjunctivae are normal. Pupils are equal, round, and reactive to light.  Neck: Normal range of motion.  Cardiovascular: Regular rhythm and normal heart sounds.   Respiratory: Effort normal. No respiratory distress.  GI: Soft.  Musculoskeletal: Normal range of motion.  Neurological: She is alert.  Skin: Skin is warm and dry.  Psychiatric: Her affect is blunt. Her speech is delayed. She is slowed. She expresses impulsivity. She exhibits a depressed mood. She expresses suicidal ideation. She  expresses no suicidal plans. She exhibits normal recent memory.     Assessment/Plan 26 year old woman with severe major depressive episode as part of what appears to be a bipolar or possibly bipolar 2 picture although she is reporting that she has psychotic symptoms when not taking antipsychotic medication which suggest more of a bipolar 1. Patient is currently on Seroquel and Effexor. Continues to be very depressed and withdrawn. No significant medical problems outside of the depression. All the labs that would be required are completed except for a chest x-ray. Patient was educated about ECT with a full description of the risks and benefits and given full opportunity to ask questions. She is expressing a tentative agreement to ECT treatment to start on Monday. Orders will be placed for her current medication although I am going to stop the lamotrigine to facilitate the ECT treatment. We will expect to start ECT treatment with right unilateral on Monday morning. Patient agrees to the plan. No other medical problems. She reports a past history of migraines but says they are under control with the modest dose of topiramate she currently  takes.  Mordecai Rasmussen, MD 06/26/2016, 5:51 PM

## 2016-06-26 NOTE — Progress Notes (Signed)
Pt has been accepted for admission at [facility] today by Dr. Toni Amendlapacs. Pt will receive ECT there. RN and MD notified. Pt notified and agreeable. CSW sent handoff to [facility] at [facility] for continuation of care.  Vernie ShanksLauren Silvester Reierson, LCSW Clinical Social Work [phone]

## 2016-06-26 NOTE — Progress Notes (Signed)
26 year old [facility].  Affect sad.  States that she is here because of depression and suicidal thoughts.  When asked did she have a plan states "I don't want to talk about it"  Continues to endorse passive SI but verbally contracts for safety.   Body search and skin assessment performed.  No contraband found.  Skin warm and dry to touch. No broken areas noted.  Multiple tattoos noted to body.  Oriented to unit and room.

## 2016-06-26 NOTE — Progress Notes (Signed)
Recreation Therapy Notes  Date: 06/26/16 Time: 0930 Location: 300 Hall Dayroom  Group Topic: Coping Skills  Goal Area(s) Addresses:  Patients will be able to identify positive stress management techniques. Patients will be able to identify the benefits of stress management. Patients will be able to identify benefits of using stress management post d/c.  Intervention: Stress Management  Activity: Progressive Muscle Relaxation.  LRT introduced the stress management technique of progressive muscle relaxation.  LRT read a script to lead the patients through the technique to tense and relax each muscle group individually.  Patients were to follow along as the script was read to engage in the activity,   Education: Coping Skills, Discharge Planning.   Education Outcome: Acknowledges understanding/In group clarification offered/Needs additional education.   Clinical Observations/Feedback: Pt did not attend group.    Cortnee Steinmiller, LRT/CTRS         [name] A 06/26/2016 11:46 AM 

## 2016-06-26 NOTE — [facility] Group Notes (Signed)
[facility] LCSW Group Therapy 06/26/2016 1:15pm  Type of Therapy: Group Therapy- Feelings Around Relapse and Recovery  Participation Level: Reserved  Participation Quality:  Appropriate  Affect:  Flat  Cognitive: Alert and Oriented   Insight:  Developing   Engagement in Therapy: Developing/Improving and Engaged   Modes of Intervention: Clarification, Confrontation, Discussion, Education, Exploration, Limit-setting, Orientation, Problem-solving, Rapport Building, Dance movement psychotherapisteality Testing, Socialization and Support  Summary of Progress/Problems: The topic for today was feelings about relapse. The group discussed what relapse prevention is to them and identified triggers that they are on the path to relapse. Members also processed their feeling towards relapse and were able to relate to common experiences. Group also discussed coping skills that can be used for relapse prevention.  Pt was more reserved in group discussion, however did identify isolation as a negative behavior that she would like to decrease.    Therapeutic Modalities:   Cognitive Behavioral Therapy Solution-Focused Therapy Assertiveness Training Relapse Prevention Therapy    Damien FusiLauren Tyrez Berrios, LCSW [phone] 06/26/2016 2:51 PM

## 2016-06-26 NOTE — Progress Notes (Signed)
[facility] MD Progress Note  06/26/2016 1:25 PM [name]  MRN:  [id number] Subjective: Patient continues to report significant anxiety, depression, and feels improvement has been limited thus far . She tends to continue to ruminate about her stressors, feels overwhelmed, and endorses passive SI, but contracts for safety on unit.   Objective :Patient seen and chart reviewed.Discussed patient with treatment team.  Patient continues to present sad, depressed, tearful, and ruminative about stressors. She continues to express a feeling of being overwhelmed and continues to endorse symptoms such as low energy level, decreased sense of self esteem,some anhedonia. Has had passive SI, at this time denies active suicidal or self injurious ideations, but states she is fearful of returning home and being " unable to cope, want to kill myself ".  Patient has reported not responding well to prior antidepressant trials in the past. Today we discussed ECT as an option, based on her current treatment resistant depression/mood disorder. Patient expressed a lot of interest in this treatment modality and agreed to it .  Denies medication side effects- no akathisia noted or reported .    Principal Problem: Major depressive disorder, recurrent severe without psychotic features (HCC) Diagnosis:   Patient Active Problem List   Diagnosis Date Noted  . Major depressive disorder, recurrent severe without psychotic features (HCC) [F33.2] 06/15/2016  . Major depressive disorder, single episode, severe without psychosis (HCC) [F32.2] 06/15/2016  . Normal labor and delivery [O80] 11/23/2010   Total Time spent with patient:20 minutes   Past psychiatric hx: Please see H&P.   Past Medical History:  Past Medical History:  Diagnosis Date  . Anxiety   . Bipolar 1 disorder (HCC)   . Costochondritis   . Depression   . History of kidney stones   . PONV (postoperative nausea and vomiting)     Past Surgical History:   Procedure Laterality Date  . APPENDECTOMY    . CHOLECYSTECTOMY    . LAPAROSCOPY N/A 04/16/2015   Procedure: LAPAROSCOPY DIAGNOSTIC;  Surgeon: Levi Aland, MD;  Location: [facility];  Service: Gynecology;  Laterality: N/A;   Family History: Please see H&P.  Family History  Problem Relation Age of Onset  . Alcohol abuse Father   . Depression Father   . Mental illness Father   . Drug abuse Father   . Depression Mother   . Birth defects Sister   . Depression Maternal Grandmother   . Heart disease Maternal Grandmother   . Hypertension Maternal Grandmother   . Depression Maternal Grandfather   . Heart disease Maternal Grandfather   . Hypertension Maternal Grandfather   . Depression Paternal Grandmother   . Heart disease Paternal Grandmother   . Hypertension Paternal Grandmother   . Depression Paternal Grandfather   . Heart disease Paternal Grandfather   . Cancer Paternal Grandfather   . Alcohol abuse Paternal Grandfather   . Hypertension Paternal Grandfather    Social History: Please see H&P.  History  Alcohol Use  . Yes     History  Drug Use No    Social History   Social History  . Marital status: Single    Spouse name: N/A  . Number of children: N/A  . Years of education: N/A   Social History Main Topics  . Smoking status: Never Smoker  . Smokeless tobacco: Never Used  . Alcohol use Yes  . Drug use: No  . Sexual activity: Yes    Birth control/ protection: None   Other Topics Concern  .  None   Social History Narrative  . None   Additional Social History:    Pain Medications: See MAR  Prescriptions: See MAR Over the Counter: See MAR History of alcohol / drug use?: Yes Name of Substance 1: Alcohol 1 - Age of First Use: unknown 1 - Frequency: "I have only drank 3 times in my life" 1 - Duration: since 23 1 - Last Use / Amount: yesterday  Sleep: improved compared to admission  Appetite: poor   Current Medications: Current Facility-Administered  Medications  Medication Dose Route Frequency Provider Last Rate Last Dose  . acetaminophen (TYLENOL) tablet 650 mg  650 mg Oral Q6H PRN Charm Rings, NP   650 mg at 06/17/16 2119  . alum & mag hydroxide-simeth (MAALOX/MYLANTA) 200-200-20 MG/5ML suspension 30 mL  30 mL Oral Q4H PRN Charm Rings, NP      . hydrOXYzine (ATARAX/VISTARIL) tablet 25 mg  25 mg Oral Q6H PRN Sanjuana Kava, NP   25 mg at 06/22/16 2000  . ibuprofen (ADVIL,MOTRIN) tablet 600 mg  600 mg Oral Q6H PRN Kerry Hough, PA-C   600 mg at 06/20/16 1623  . lamoTRIgine (LAMICTAL) tablet 25 mg  25 mg Oral BID Craige Cotta, MD   25 mg at 06/26/16 0846  . liothyronine (CYTOMEL) tablet 25 mcg  25 mcg Oral Daily Charm Rings, NP   25 mcg at 06/26/16 0846  . LORazepam (ATIVAN) tablet 0.5 mg  0.5 mg Oral Q6H PRN Craige Cotta, MD   0.5 mg at 06/26/16 0847  . magnesium hydroxide (MILK OF MAGNESIA) suspension 30 mL  30 mL Oral Daily PRN Charm Rings, NP      . multivitamin with minerals tablet 1 tablet  1 tablet Oral Daily Craige Cotta, MD   1 tablet at 06/26/16 0846  . QUEtiapine (SEROQUEL) tablet 400 mg  400 mg Oral QHS Craige Cotta, MD   400 mg at 06/25/16 2144  . thiamine (VITAMIN B-1) tablet 100 mg  100 mg Oral Daily Craige Cotta, MD   100 mg at 06/26/16 0846  . topiramate (TOPAMAX) tablet 50 mg  50 mg Oral BID Charm Rings, NP   50 mg at 06/26/16 0846  . venlafaxine XR (EFFEXOR-XR) 24 hr capsule 112.5 mg  112.5 mg Oral Q breakfast Craige Cotta, MD   112.5 mg at 06/26/16 1610    Lab Results:  No results found for this or any previous visit (from the past 48 hour(s)).  Blood Alcohol level:  Lab Results  Component Value Date   ETH <5 06/14/2016    Metabolic Disorder Labs: Lab Results  Component Value Date   HGBA1C 4.7 (L) 06/17/2016   MPG 88 06/17/2016   Lab Results  Component Value Date   PROLACTIN 60.7 (H) 06/17/2016   Lab Results  Component Value Date   CHOL 118 06/17/2016   TRIG 100  06/17/2016   HDL 33 (L) 06/17/2016   CHOLHDL 3.6 06/17/2016   VLDL 20 06/17/2016   LDLCALC 65 06/17/2016    Physical Findings: AIMS: Facial and Oral Movements Muscles of Facial Expression: None, normal Lips and Perioral Area: None, normal Jaw: None, normal Tongue: None, normal,Extremity Movements Upper (arms, wrists, hands, fingers): None, normal Lower (legs, knees, ankles, toes): None, normal, Trunk Movements Neck, shoulders, hips: None, normal, Overall Severity Severity of abnormal movements (highest score from questions above): None, normal Incapacitation due to abnormal movements: None, normal Patient's awareness of abnormal  movements (rate only patient's report): No Awareness, Dental Status Current problems with teeth and/or dentures?: No Does patient usually wear dentures?: No  CIWA:  CIWA-Ar Total: 0 COWS:     Musculoskeletal: Strength & Muscle Tone: within normal limits Gait & Station: normal Patient leans: N/A  Psychiatric Specialty Exam: Physical Exam  Nursing note and vitals reviewed.   Review of Systems  Psychiatric/Behavioral: Positive for depression, hallucinations and suicidal ideas. The patient is nervous/anxious and has insomnia.   All other systems reviewed and are negative.  denies nausea, vomiting, no fever, no chills   Blood pressure 122/77, pulse 93, temperature 98 F (36.7 C), temperature source Oral, resp. rate 16, height 5\' 4"  (1.626 m), weight 69.4 kg (153 lb), last menstrual period 06/12/2016, SpO2 100 %, unknown if currently breastfeeding.Body mass index is 26.26 kg/m.  General Appearance: fairly groomed   Eye Contact:  Good  Speech:  Normal Rate  Volume: normal   Mood:  Remains depressed, sad   Affect:  Still constricted, often tearful   Thought Process: goal directed, associations intact   Orientation:  Full (Time, Place, and Person)  Thought Content: remains ruminative, states she feels overwhelmed by stressors, denies hallucinations, no  delusions expressed   Suicidal Thoughts:   Passive SI, but contracts for safety on unit   Homicidal Thoughts:   No  denies homicidal or violent ideations  Memory: recent and remote grossly intact   Judgement: improved   Insight: improved   Psychomotor Activity:  Normal  Concentration:  Concentration: Good and Attention Span: Good  Recall:  Good  Fund of Knowledge:  Good  Language:  Good  Akathisia:  Negative  Handed:  Right  AIMS (if indicated):   no abnormal or involuntary movements noted or reactive   Assets:  Communication Skills Desire for Improvement Physical Health  ADL's:  Intact  Cognition:  WNL  Sleep:  Number of Hours: 6.75   Assessment - patient has continued to present with significant depression , sadness, anxiety, neuro-vegetative symptoms. Endorses feeling overwhelmed often and endorses passive SI . No psychotic symptoms. Improvement has been modest and variable since her admission, in spite of management with medications as below. She has a history of poor response to prior antidepressant trials as per her report . Endorses history of mood disorder characterized by episodes of depression and brief episodes suggestive of hypomania, but stresses depression as major symptom. Based on above, we discussed ECT as an option- patient reported a lot of interest in this treatment modality and agreed to ECT referral .   Treatment Plan Summary:  Plans and medication management reviewed  2/16 as below . Daily contact with patient to assess and evaluate symptoms and progress in treatment, Medication management, Plan see below and medications as below Encourage group and milieu participation to work on coping skills and symptom reduction Continue Seroquel 400   mgrs QHS for mood disorder. Continue Topamax 50 mgrs BID for mood disorder, headache prophylaxis  Continue Lamictal  25  mgr BID for mood disorder, bipolar depression Continue  Effexor XR  112.5  mgrs  QDAY for depression,  anxiety   Change  Ativan   0.5 mgrs Q 6 hours PRN for anxiety as needed  Continue Cytomel 25 micrograms QDAY for hypothyroidism As reviewed with team ECT referral initiated to Dr. T[facility]acs at [facility], for consideration of inpatient ECT there. Patient aware of the above and agrees .  Treatment team working on disposition planning options  Nehemiah MassedOBOS, FERNANDO, MD  06/26/2016, 1:25 PM   Patient ID: [name], female   DOB: [birth year], 26 y.o.   MRN: [id number]

## 2016-06-26 NOTE — Tx Team (Signed)
Initial Treatment Plan 06/26/2016 6:42 PM [name] [id number]    PATIENT STRESSORS: Marital or family conflict Occupational concerns   PATIENT STRENGTHS: Average or above average intelligence Capable of independent living Communication skills Physical Health   PATIENT IDENTIFIED PROBLEMS: "abiltiy to be able to talk about problems and concerns"  "Improved mood"  Develop coping skills                 DISCHARGE CRITERIA:  Improved stabilization in mood, thinking, and/or behavior Motivation to continue treatment in a less acute level of care  PRELIMINARY DISCHARGE PLAN: Return to previous living arrangement  PATIENT/FAMILY INVOLVEMENT: This treatment plan has been presented to and reviewed with the patient, [name], and/or family member, .  The patient and family have been given the opportunity to ask questions and make suggestions.  Elige RadonCobb, Penda Venturi B, RN 06/26/2016, 6:42 PM

## 2016-06-26 NOTE — Progress Notes (Signed)
Patient ID: [name], female   DOB: Oct 23, 1990, 26 y.o.   MRN: [id number]  Discharge Note: Belongings returned to patient at time of discharge. Discharge instructions and medications were reviewed with patient. Patient verbalized understanding of both medications and discharge instructions. Patient was discharged to lobby where Juel [facility]row[facility] was waiting to [facility]. Patient's AVS, transition record, and SRA were given to the [facility] driver. Patient is teary-eyed but otherwise in no apparent distress. Q15 minute safety checks were maintained until discharge.

## 2016-06-26 NOTE — BH Assessment (Signed)
Patient has been accepted to [facility].  Accepting physician is Dr. Toni Amendlapacs.  Attending Physician will be Dr. Toni Amendlapacs.  Patient has been [facility] to room 320, by [facility] Charge Nurse [place].  Call report to [phone].  Representative/Transfer Coordinator is Warden/rangerCalvin Patient pre-admitted by [facility] Patient Access Victorino Dike(Maira)  [facility] Staff Byrd Hesselbach(Maria, Social Worker) made aware of acceptance.

## 2016-06-26 NOTE — Discharge Summary (Signed)
Physician Discharge Summary Note  Patient:  [name] is an 26 y.o., female MRN:  [id number] DOB:  [birth year] Patient phone:  [phone] (home)  Patient address:   [address] [place] [id number],  Total Time spent with patient: Greater than 30 minutes  Date of Admission:  06/15/2016  Date of Discharge: 06-26-16  Reason for Admission: Worsening symptoms of depression not responding to current medication regimen.  Principal Problem: Major depressive disorder, recurrent severe without psychotic features [facility](HCC)  Discharge Diagnoses: Patient Active Problem List   Diagnosis Date Noted  . Bipolar I disorder, most recent episode depressed (HCC) [F31.30]   . Major depressive disorder, recurrent severe without psychotic features (HCC) [F33.2] 06/15/2016  . Major depressive disorder, single episode, severe without psychosis (HCC) [F32.2] 06/15/2016  . Normal labor and delivery [O80] 11/23/2010   Past Psychiatric History: Major depression.  Past Medical History:  Past Medical History:  Diagnosis Date  . Anxiety   . Bipolar 1 disorder (HCC)   . Costochondritis   . Depression   . History of kidney stones   . PONV (postoperative nausea and vomiting)     Past Surgical History:  Procedure Laterality Date  . APPENDECTOMY    . CHOLECYSTECTOMY    . LAPAROSCOPY N/A 04/16/2015   Procedure: LAPAROSCOPY DIAGNOSTIC;  Surgeon: Levi AlandMark E Anderson, MD;  Location: [facility];  Service: Gynecology;  Laterality: N/A;   Family History:  Family History  Problem Relation Age of Onset  . Alcohol abuse Father   . Depression Father   . Mental illness Father   . Drug abuse Father   . Depression Mother   . Birth defects Sister   . Depression Maternal Grandmother   . Heart disease Maternal Grandmother   . Hypertension Maternal Grandmother   . Depression Maternal Grandfather   . Heart disease Maternal Grandfather   . Hypertension Maternal Grandfather   . Depression Paternal  Grandmother   . Heart disease Paternal Grandmother   . Hypertension Paternal Grandmother   . Depression Paternal Grandfather   . Heart disease Paternal Grandfather   . Cancer Paternal Grandfather   . Alcohol abuse Paternal Grandfather   . Hypertension Paternal Grandfather    Family Psychiatric  History: See H&P Social History:  History  Alcohol Use  . Yes     History  Drug Use No    Social History   Social History  . Marital status: Single    Spouse name: N/A  . Number of children: N/A  . Years of education: N/A   Social History Main Topics  . Smoking status: Never Smoker  . Smokeless tobacco: Never Used  . Alcohol use Yes  . Drug use: No  . Sexual activity: Yes    Birth control/ protection: None   Other Topics Concern  . None   Social History Narrative  . None   Hospital Course: 26 year old female. Reports long history of mood disorder, states that over the last few weeks she has been feeling more depressed, sad . She states she has recently been experiencing increasing suicidal ideations, mostly passive, but states " I have thought of ways I could do it". She decided to go to the hospital voluntarily because " I could not take it anymore". Reports several neuro-vegetative symptoms of depression as below. In addition to depressive symptoms, describes symptoms of anxiety, panic attacks, and also describes some OCD type symptoms such as obsessive ideations that are ego-dystonic ( states she  does not want to describe them because feels embarrassed ) and that she then has to " talk back to them  in my head to counteract them" Denies psychotic symptoms and has insight that these are own thoughts.  [name] was admitted to the [facility] for worsening symptoms of depression that she stated has been going on for quite a while. She also reported associated anxiety symptoms & had developed suicidal ideations without a definite plan to end her life. She also reported having panic attacks &  OCD-type symptoms which she feels were embarassing if she has to talk about those symptoms. She was in need of mood stabilization treatments.  After evaluation of her presenting symptoms, [name] was stated on; Hydroxyzine 25 mg prn for anxiety, Lamictal 25 mg for mood stabilization, Lorazepam 0.5 mg prn for severe anxiety, Seroquel 400 mg for mood control, Topamax 50 mg for mood stabilization & Effexor XR 112.5 mg for depression. She received other medication regimen for the other medical issues presented. She tolerated her treatment regimen without any adverse effects or reactions reported. She was also enrolled in the group counseling sessions being offered & held on this unit to learn coping skills..  Throughout her hospital, [name]'s medication regimen were being adjusted to meet her psychiatric needs. However, follow-up care assessment revealed that she remained depressed, sad & constricted in affect. She continuously reported that her symptoms apparently were not responding well to her current treatment regimen. However, she does not appear internally preoccupied & no delusions were expressed, but remained very depressed.  She has now agreed to invest in the ECT as a treatment option and expressing hope that it will help her depression get better . She is currently being discharged from the [facility] to the [facility] center in [place], [place] for the ECT.  Although, presents as very depressed, [name] is in no apparent distress. Transportation per [employer].  Physical Findings: AIMS: Facial and Oral Movements Muscles of Facial Expression: None, normal Lips and Perioral Area: None, normal Jaw: None, normal Tongue: None, normal,Extremity Movements Upper (arms, wrists, hands, fingers): None, normal Lower (legs, knees, ankles, toes): None, normal, Trunk Movements Neck, shoulders, hips: None, normal, Overall Severity Severity of abnormal movements (highest score from  questions above): None, normal Incapacitation due to abnormal movements: None, normal Patient's awareness of abnormal movements (rate only patient's report): No Awareness, Dental Status Current problems with teeth and/or dentures?: No Does patient usually wear dentures?: No  CIWA:  CIWA-Ar Total: 0 COWS:     Musculoskeletal: Strength & Muscle Tone: within normal limits Gait & Station: normal Patient leans: N/A  Psychiatric Specialty Exam: Physical Exam  Constitutional: She appears well-developed.  HENT:  Head: Normocephalic.  Eyes: Pupils are equal, round, and reactive to light.  Neck: Normal range of motion.  Cardiovascular: Normal rate.   Respiratory: Effort normal.  GI: Soft.  Genitourinary:  Genitourinary Comments: Deferred  Musculoskeletal: Normal range of motion.  Neurological: She is alert.  Skin: Skin is warm.    Review of Systems  Constitutional: Negative.   HENT: Negative.   Eyes: Negative.   Respiratory: Negative.   Cardiovascular: Negative.   Gastrointestinal: Negative.   Genitourinary: Negative.   Musculoskeletal: Negative.   Skin: Negative.   Neurological: Negative.   Endo/Heme/Allergies: Negative.   Psychiatric/Behavioral: Positive for depression and suicidal ideas. Negative for hallucinations, memory loss and substance abuse. The patient is nervous/anxious and has insomnia.     Blood pressure 122/77, pulse 93, temperature 98 F (  36.7 C), temperature source Oral, resp. rate 16, height 5\' 4"  (1.626 m), weight 69.4 kg (153 lb), last menstrual period 06/12/2016, SpO2 100 %, unknown if currently breastfeeding.Body mass index is 26.26 kg/m.  See Md's SRA   Have you used any form of tobacco in the last 30 days? (Cigarettes, Smokeless Tobacco, Cigars, and/or Pipes): No  Has this patient used any form of tobacco in the last 30 days? (Cigarettes, Smokeless Tobacco, Cigars, and/or Pipes)No  Blood Alcohol level:  Lab Results  Component Value Date   ETH <5  06/14/2016   Metabolic Disorder Labs:  Lab Results  Component Value Date   HGBA1C 4.7 (L) 06/17/2016   MPG 88 06/17/2016   Lab Results  Component Value Date   PROLACTIN 60.7 (H) 06/17/2016   Lab Results  Component Value Date   CHOL 118 06/17/2016   TRIG 100 06/17/2016   HDL 33 (L) 06/17/2016   CHOLHDL 3.6 06/17/2016   VLDL 20 06/17/2016   LDLCALC 65 06/17/2016   See Psychiatric Specialty Exam and Suicide Risk Assessment completed by Attending Physician prior to discharge.  Discharge destination:  Home  Is patient on multiple antipsychotic therapies at discharge:  No   Has Patient had three or more failed trials of antipsychotic monotherapy by history:  No  Recommended Plan for Multiple Antipsychotic Therapies: NA  Allergies as of 06/26/2016      Reactions   Phenergan [promethazine Hcl] Other (See Comments)   Reaction:  Shaking       Medication List    TAKE these medications     Indication  acetaminophen 325 MG tablet Commonly known as:  TYLENOL Take 2 tablets (650 mg total) by mouth every 6 (six) hours as needed for mild pain.  Indication:  Fever, Pain   alum & mag hydroxide-simeth 200-200-20 MG/5ML suspension Commonly known as:  MAALOX/MYLANTA Take 30 mLs by mouth every 4 (four) hours as needed for indigestion.  Indication:  Heartburn   hydrOXYzine 25 MG tablet Commonly known as:  ATARAX/VISTARIL Take 1 tablet (25 mg total) by mouth every 6 (six) hours as needed (anxiety / agitateion).  Indication:  Anxiety Neurosis   ibuprofen 600 MG tablet Commonly known as:  ADVIL,MOTRIN Take 1 tablet (600 mg total) by mouth every 6 (six) hours as needed for fever, headache or moderate pain.  Indication:  Moderate pian   lamoTRIgine 25 MG tablet Commonly known as:  LAMICTAL Take 1 tablet (25 mg total) by mouth 2 (two) times daily. For mood stabilization  Indication:  Mood stabilization   liothyronine 25 MCG tablet Commonly known as:  CYTOMEL Take 1 tablet (25 mcg  total) by mouth daily. For Hypothyroidism. What changed:  additional instructions  Indication:  Underactive Thyroid   LORazepam 0.5 MG tablet Commonly known as:  ATIVAN Take 1 tablet (0.5 mg total) by mouth every 6 (six) hours as needed for anxiety.  Indication:  Anxiousness associated with Depression   magnesium hydroxide 400 MG/5ML suspension Commonly known as:  MILK OF MAGNESIA Take 30 mLs by mouth daily as needed for mild constipation.  Indication:  Constipation   MULTIVITAMIN GUMMIES ADULT Chew Chew 1 each by mouth 2 (two) times daily. Vitamin replacement What changed:  additional instructions  Indication:  Vitamin supplement   QUEtiapine 400 MG tablet Commonly known as:  SEROQUEL Take 1 tablet (400 mg total) by mouth at bedtime. For mood control What changed:  medication strength  how much to take  additional instructions  Indication:  Mood  control   thiamine 100 MG tablet Take 1 tablet (100 mg total) by mouth daily. For low thiamine Start taking on:  06/27/2016  Indication:  Deficiency in Thiamine or Vitamin B1   topiramate 50 MG tablet Commonly known as:  TOPAMAX Take 1 tablet (50 mg total) by mouth 2 (two) times daily. For mood stabilization What changed:  additional instructions  Indication:  Mood stabilization   venlafaxine XR 37.5 MG 24 hr capsule Commonly known as:  EFFEXOR-XR Take 3 capsules (112.5 mg total) by mouth daily with breakfast. For depression Start taking on:  06/27/2016  Indication:  Major Depressive Disorder      Follow-up Information    [facility] Follow up on 07/07/2016.   Why:  at 1:00pm with Cam Hines for your initial assessment. Medication management appointment scheduled for 3/21 at 9:15am with Dr. Lenoria Farrier. Contact information: [address] [place] [address] [phone]       [facility] Follow up on 06/30/2016.   Why:  at 11:00am for your intial intake. Please arrive 20 minutes prior to  your appointment.  Contact information: [address] [place], [place] [address] Phone: [phone] Fax: [phone]         Follow-up recommendations:  Patient is currently being transferred to the [facility] in [facility] for ECT.  Signed: Sanjuana Kava, NP, PMHNP, FNP-BC. 06/26/2016, 3:52 PM

## 2016-06-26 NOTE — [facility] Suicide Risk Assessment (Signed)
[facility] Admission Suicide Risk Assessment   Nursing information obtained from:   review of nursing notes from behavioral health Hospital conversation with nursing staff Demographic factors:   patient is living alone and has very limited social support Current Mental Status:   depressed flat down. Passive suicidal ideation. No psychosis. Loss Factors:   nothing specific identified Historical Factors:   long-standing depression past history of suicide attempts Risk Reduction Factors:   has young children she cares for  Total Time spent with patient: 1 hour Principal Problem: <principal problem not specified> Diagnosis:   Patient Active Problem List   Diagnosis Date Noted  . Bipolar depression (HCC) [F31.30] 06/26/2016  . Bipolar I disorder, most recent episode depressed (HCC) [F31.30]   . Major depressive disorder, recurrent severe without psychotic features (HCC) [F33.2] 06/15/2016  . Major depressive disorder, single episode, severe without psychosis (HCC) [F32.2] 06/15/2016  . Normal labor and delivery [O80] 11/23/2010   Subjective Data:26 year old woman with a history of bipolar depression. Passive suicidal ideation is present. No plan or intent. Patient is cooperative with current treatment. Not expressing any evidence of psychosis. Agreeable to treatment plan with ECT.  Continued Clinical Symptoms:    The "Alcohol Use Disorders Identification Test", Guidelines for Use in Primary Care, Second Edition.  World Science writer [facility]). Score between 0-7:  no or low risk or alcohol related problems. Score between 8-15:  moderate risk of alcohol related problems. Score between 16-19:  high risk of alcohol related problems. Score 20 or above:  warrants further diagnostic evaluation for alcohol dependence and treatment.   CLINICAL FACTORS:   Bipolar Disorder:   Depressive phase Depression:   Severe   Musculoskeletal: Strength & Muscle Tone: within normal limits Gait & Station:  normal Patient leans: N/A  Psychiatric Specialty Exam: Physical Exam  Nursing note and vitals reviewed. Constitutional: She appears well-developed and well-nourished.  HENT:  Head: Normocephalic and atraumatic.  Eyes: Conjunctivae are normal. Pupils are equal, round, and reactive to light.  Neck: Normal range of motion.  Cardiovascular: Normal heart sounds.   Respiratory: Effort normal. No respiratory distress.  GI: Soft.  Musculoskeletal: Normal range of motion.  Neurological: She is alert.  Skin: Skin is warm and dry.     Psychiatric: Judgment normal. Her affect is blunt. Her speech is delayed. She is slowed. Thought content is not paranoid. Cognition and memory are normal. She exhibits a depressed mood. She expresses suicidal ideation. She expresses no homicidal ideation. She expresses no suicidal plans.    Review of Systems  Constitutional: Negative.   HENT: Negative.   Eyes: Negative.   Respiratory: Negative.   Cardiovascular: Negative.   Gastrointestinal: Negative.   Musculoskeletal: Negative.   Skin: Negative.   Neurological: Negative.   Psychiatric/Behavioral: Positive for depression and suicidal ideas. Negative for hallucinations, memory loss and substance abuse. The patient is nervous/anxious and has insomnia.     Blood pressure 128/75, pulse 99, temperature 98.8 F (37.1 C), temperature source Oral, resp. rate 18, height 5\' 4"  (1.626 m), last menstrual period 06/12/2016, SpO2 100 %, unknown if currently breastfeeding.There is no height or weight on file to calculate BMI.  General Appearance: Disheveled  Eye Contact:  Good  Speech:  Slow  Volume:  Decreased  Mood:  Depressed  Affect:  Constricted  Thought Process:  Goal Directed  Orientation:  Full (Time, Place, and Person)  Thought Content:  Logical  Suicidal Thoughts:  Yes.  without intent/plan  Homicidal Thoughts:  No  Memory:  Immediate;   Good Recent;   Fair Remote;   Fair  Judgement:  Fair  Insight:   Good  Psychomotor Activity:  Decreased  Concentration:  Concentration: Fair  Recall:  [employer]  Fund of Knowledge:  Fair  Language:  Fair  Akathisia:  No  Handed:  Right  AIMS (if indicated):     Assets:  Communication Skills Desire for Improvement Housing Physical Health Resilience  ADL's:  Intact  Cognition:  WNL  Sleep:         COGNITIVE FEATURES THAT CONTRIBUTE TO RISK:  Thought constriction (tunnel vision)    SUICIDE RISK:   Mild:  Suicidal ideation of limited frequency, intensity, duration, and specificity.  There are no identifiable plans, no associated intent, mild dysphoria and related symptoms, good self-control (both objective and subjective assessment), few other risk factors, and identifiable protective factors, including available and accessible social support.  PLAN OF CARE: Patient is on 15 minute checks. Continue medication for bipolar depression. ECT to start on Monday. Standard involvement in groups and assessment and reassessment of suicidality before discharge  I certify that inpatient services furnished can reasonably be expected to improve the patient's condition.   Mordecai RasmussenJohn Darly Massi, MD 06/26/2016, 5:46 PM

## 2016-06-27 MED ORDER — QUETIAPINE FUMARATE 200 MG PO TABS
300.0000 mg | ORAL_TABLET | Freq: Every day | ORAL | Status: DC
  Administered 2016-06-27 – 2016-06-28 (×2): 300 mg via ORAL
  Filled 2016-06-27 (×2): qty 1

## 2016-06-27 NOTE — [facility] Group Notes (Signed)
[facility] LCSW Group Therapy  06/27/2016 3:48 PM  Type of Therapy:  Group Therapy  Participation Level:  Minimal  Participation Quality:  Attentive  Affect:  Appropriate and Depressed  Cognitive:  Alert  Insight:  Limited  Engagement in Therapy:  Limited  Modes of Intervention:  Discussion, Education, Problem-solving, Reality Testing and Support  Summary of Progress/Problems: Self-responsibility/accountability- Patients discussed self responsibility/accountability  and how it impacts them. Patients were asked to define these concepts in their own words. They discussed taking ownership of their actions and the challenges they have with taking accountability for their self. CSW introduced "YOU vs. I" statements and explained how "I" statements identifies how they feel about the situation without being threatening or offensive to others and could help improve their communication with others. Examples were provided of each. They were challenged to identify changes that are needed in order to improve self responsibility/accountability. CSW provided inspirational quotes that focused on the patients taking accountability for actions both good and bad. Patients were asked to read the quotes out loud and share their thoughts with the group about their quote.  Marvin Maenza G. Garnette CzechSampson MSW, LCSWA 06/27/2016, 3:49 PM

## 2016-06-27 NOTE — Plan of Care (Signed)
Problem: Safety: Goal: Ability to disclose and discuss suicidal ideas will improve Outcome: Not Progressing Endorses SI.  Refrains from any further discussion of any plan or triggers that causes suicidal thoughts.

## 2016-06-27 NOTE — Progress Notes (Signed)
Passive SI. Isolated to room majority of shift. Affect depressed. Interaction appropriate with staff. Medication compliant. Denies pain. Voices no additional concerns at this time. Safety maintained. Will continue to monitor.

## 2016-06-27 NOTE — Progress Notes (Addendum)
[facility] MD Progress Note  06/27/2016 2:47 PM [name]  MRN:  [id number] Subjective:   "Mediicine not working, they arer planning fpr ECT" Pt endorsing depression with suicidal ideation, no plan. Willing to  Start ECT soon. Pt poor eye contact. Blunted affect, Continues to be very depressed and withdrawn. BP slightly low, pt asymptomatic. Principal Problem: <principal problem not specified> Diagnosis:   Patient Active Problem List   Diagnosis Date Noted  . Bipolar depression (HCC) [F31.30] 06/26/2016  . Bipolar I disorder, most recent episode depressed (HCC) [F31.30]   . Major depressive disorder, recurrent severe without psychotic features (HCC) [F33.2] 06/15/2016  . Major depressive disorder, single episode, severe without psychosis (HCC) [F32.2] 06/15/2016  . Normal labor and delivery [O80] 11/23/2010   Total Time spent with patient: 30 minutes  Past Psychiatric History:   Past Medical History:  Past Medical History:  Diagnosis Date  . Anxiety   . Bipolar 1 disorder (HCC)   . Costochondritis   . Depression   . History of kidney stones   . PONV (postoperative nausea and vomiting)     Past Surgical History:  Procedure Laterality Date  . APPENDECTOMY    . CHOLECYSTECTOMY    . LAPAROSCOPY N/A 04/16/2015   Procedure: LAPAROSCOPY DIAGNOSTIC;  Surgeon: Levi Aland, MD;  Location: [facility];  Service: Gynecology;  Laterality: N/A;   Family History:  Family History  Problem Relation Age of Onset  . Alcohol abuse Father   . Depression Father   . Mental illness Father   . Drug abuse Father   . Depression Mother   . Birth defects Sister   . Depression Maternal Grandmother   . Heart disease Maternal Grandmother   . Hypertension Maternal Grandmother   . Depression Maternal Grandfather   . Heart disease Maternal Grandfather   . Hypertension Maternal Grandfather   . Depression Paternal Grandmother   . Heart disease Paternal Grandmother   . Hypertension Paternal  Grandmother   . Depression Paternal Grandfather   . Heart disease Paternal Grandfather   . Cancer Paternal Grandfather   . Alcohol abuse Paternal Grandfather   . Hypertension Paternal Grandfather    Family Psychiatric  History:  Social History:  History  Alcohol Use  . Yes     History  Drug Use No    Social History   Social History  . Marital status: Single    Spouse name: N/A  . Number of children: N/A  . Years of education: N/A   Social History Main Topics  . Smoking status: Never Smoker  . Smokeless tobacco: Never Used  . Alcohol use Yes  . Drug use: No  . Sexual activity: Yes    Birth control/ protection: None   Other Topics Concern  . None   Social History Narrative  . None   Additional Social History:                         Sleep: Good  Appetite:  Fair  Current Medications: Current Facility-Administered Medications  Medication Dose Route Frequency Provider Last Rate Last Dose  . acetaminophen (TYLENOL) tablet 650 mg  650 mg Oral Q6H PRN Audery Amel, MD      . alum & mag hydroxide-simeth (MAALOX/MYLANTA) 200-200-20 MG/5ML suspension 30 mL  30 mL Oral Q4H PRN Audery Amel, MD      . hydrOXYzine (ATARAX/VISTARIL) tablet 25 mg  25 mg Oral TID PRN Audery Amel, MD      .  liothyronine (CYTOMEL) tablet 25 mcg  25 mcg Oral Daily Audery Amel, MD   25 mcg at 06/27/16 4098  . magnesium hydroxide (MILK OF MAGNESIA) suspension 30 mL  30 mL Oral Daily PRN Audery Amel, MD      . multivitamin-lutein (OCUVITE-LUTEIN) capsule 1 capsule  1 capsule Oral Daily Audery Amel, MD   1 capsule at 06/27/16 1191  . QUEtiapine (SEROQUEL) tablet 400 mg  400 mg Oral QHS Audery Amel, MD   400 mg at 06/26/16 2201  . topiramate (TOPAMAX) tablet 50 mg  50 mg Oral BID Audery Amel, MD   50 mg at 06/27/16 4782  . traZODone (DESYREL) tablet 100 mg  100 mg Oral QHS PRN Audery Amel, MD      . venlafaxine XR (EFFEXOR-XR) 24 hr capsule 150 mg  150 mg Oral Q  breakfast Audery Amel, MD   150 mg at 06/27/16 9562    Lab Results: No results found for this or any previous visit (from the past 48 hour(s)).  Blood Alcohol level:  Lab Results  Component Value Date   ETH <5 06/14/2016    Metabolic Disorder Labs: Lab Results  Component Value Date   HGBA1C 4.7 (L) 06/17/2016   MPG 88 06/17/2016   Lab Results  Component Value Date   PROLACTIN 60.7 (H) 06/17/2016   Lab Results  Component Value Date   CHOL 118 06/17/2016   TRIG 100 06/17/2016   HDL 33 (L) 06/17/2016   CHOLHDL 3.6 06/17/2016   VLDL 20 06/17/2016   LDLCALC 65 06/17/2016    Physical Findings: AIMS: Facial and Oral Movements Muscles of Facial Expression: None, normal Lips and Perioral Area: None, normal Jaw: None, normal Tongue: None, normal,Extremity Movements Upper (arms, wrists, hands, fingers): None, normal Lower (legs, knees, ankles, toes): None, normal, Trunk Movements Neck, shoulders, hips: None, normal, Overall Severity Severity of abnormal movements (highest score from questions above): None, normal Incapacitation due to abnormal movements: None, normal Patient's awareness of abnormal movements (rate only patient's report): No Awareness, Dental Status Current problems with teeth and/or dentures?: No Does patient usually wear dentures?: No  CIWA:    COWS:     Musculoskeletal: Strength & Muscle Tone: nwl Gait & Station: steady gait, no EPS Patient leans:   Psychiatric Specialty Exam: Physical Exam  Nursing note and vitals reviewed.   Review of Systems  Psychiatric/Behavioral: Positive for depression and suicidal ideas.    Blood pressure (!) 119/55, pulse 93, temperature 98.3 F (36.8 C), resp. rate 18, height 5\' 4"  (1.626 m), weight 69.9 kg (154 lb), last menstrual period 06/12/2016, SpO2 100 %, unknown if currently breastfeeding.Body mass index is 26.43 kg/m.  General Appearance: poorly grommed  Eye Contact:  poor  Speech:  Slow  Volume:   Decreased  Mood:  Anxious and Depressed  Affect:  Depressed and Restricted  Thought Process:  Goal Directed  Orientation:  Full (Time, Place, and Person)  Thought Content:  Logical  Suicidal Thoughts:  Yes.  without intent/plan  Homicidal Thoughts:  No  Memory:  intact  Judgement:  limited  Insight:  Fair  Psychomotor Activity:  Normal  Concentration:  fair  Recall:  Fair  Fund of Knowledge:  Good  Language:  Good  Akathisia:  No  Handed:    AIMS (if indicated):     Assets:  Cooperative with tx  ADL's:    Cognition:  memory intact  Sleep:  Number of Hours: 8  Treatment Plan Summary: Daily contact with patient to assess and evaluate symptoms and progress in treatment and Medication management and planned ECT.  26 year old woman with severe major depressive episode as part of what appears to be a bipolar disorder. Depression-  Cont  Seroquel and Effexor, plan ECT on Monday. Patient was educated about ECT with a full description of the risks and benefits and alternatives, She is agreeable  to ECT treatment.   History of migraines - topiramate . BP slightly low, pt asymptomatic, will monitor VS, decrease Seroquel to 300mg  qhs for low Bp.   Beverly SessionsJagannath Zaya Kessenich, MD 06/27/2016, 2:47 PM
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
## 2016-06-27 NOTE — Progress Notes (Signed)
Flat affect.  Continues to verbalized depression and having passive SI.  Isolates to room.  No interaction with peers.  No group attendance.  Out of room for meals, med pass and to talk on telephone.  Support and encouragement offered..  Safety maintained.

## 2016-06-27 NOTE — [facility] Suicide Risk Assessment (Signed)
[facility] INPATIENT:  Family/Significant Other Suicide Prevention Education  Suicide Prevention Education:  Education Completed: [name](grandmother [phone]), of family member/significant other) has been identified by the patient as the family member/significant other with whom the patient will be residing, and identified as the person(s) who will aid the patient in the event of a mental health crisis (suicidal ideations/suicide attempt).  With written consent from the patient, the family member/significant other has been provided the following suicide prevention education, prior to the and/or following the discharge of the patient.  The suicide prevention education provided includes the following:  Suicide risk factors  Suicide prevention and interventions  National Suicide Hotline telephone number  [facility] assessment telephone number  [facility] Emergency Assistance 911  [facility] and/or Residential Mobile Crisis Unit telephone number  Request made of family/significant other to:  Remove weapons (e.g., guns, rifles, knives), all items previously/currently identified as safety concern.    Remove drugs/medications (over-the-counter, prescriptions, illicit drugs), all items previously/currently identified as a safety concern.  The family member/significant other verbalizes understanding of the suicide prevention education information provided.  The family member/significant other agrees to remove the items of safety concern listed above.  [name] G. Garnette CzechSampson MSW, LCSWA 06/27/2016, 4:15 PM

## 2016-06-27 NOTE — Plan of Care (Signed)
Problem: Activity: Goal: Interest or engagement in leisure activities will improve Outcome: Not Progressing Isolates to room during free time this shift.

## 2016-06-28 NOTE — Progress Notes (Signed)
Passive SI. Isolated to room majority of shift. Affect depressed. Interaction appropriate with staff. Medication compliant. Ex[ressed concern that HS Seroquel was decreased to 300 mgs. Wants to discuss with MD.Denies pain. Voices no additional concerns at this time. Safety maintained. Will continue to monitor

## 2016-06-28 NOTE — [facility] Group Notes (Signed)
[facility] LCSW Group Therapy  06/28/2016 3:16 PM  Type of Therapy:  Group Therapy  Participation Level:  Patient did not attend group. CSW invited patient to group.   Summary of Progress/Problems: Recognizing Triggers: Patients defined triggers and discussed the importance of recognizing their personal warning signs. Patients identified their own triggers and how they tend to cope with stressful situations. Patients discussed areas such as people, places, things, and thoughts that rigger certain emotions for them. CSW provided support to patients and discussed safety planning for when these triggers occur. Group participants had opportunities to share openly with the group and participate in a group discussion while providing support and feedback to their peers.  Makhi Muzquiz G. Garnette CzechSampson MSW, LCSWA 06/28/2016, 3:17 PM

## 2016-06-28 NOTE — Plan of Care (Signed)
Problem: Coping: Goal: Ability to verbalize feelings will improve Outcome: Progressing Pt able to verbalize feelings appropriately when engaged by staff.

## 2016-06-28 NOTE — Progress Notes (Signed)
Patient with sad affect, slightly anxious.VS monitored with pulse elevated. MD aware, into see patient, orders EKG. Cooperative with meals, meds and plan of care. No SI/HI at this time. Aware of prn med for anxiety and patient refuses Atarax. Minimal interaction with peers. Verbalizes needs appropriately with staff. Safety maintained.

## 2016-06-28 NOTE — Plan of Care (Signed)
Problem: Activity: Goal: Interest or engagement in leisure activities will improve Outcome: Not Progressing Pt frequent isolates to room

## 2016-06-28 NOTE — Progress Notes (Signed)
[facility] MD Progress Note  06/28/2016 12:23 PM [name]  MRN:  [id number] Subjective:   "Mediicine not working, they arer planning fpr ECT" Pt endorsing depression with suicidal ideation, no plan. Willing to  [employer] ECT tomorrow. Pt  anxious, irritable, continues to be very depressed and withdrawn. Pt tachycardia but BP is wnl,  pt report lightheadedness at times. Will order EKG.  Principal Problem: <principal problem not specified> Diagnosis:   Patient Active Problem List   Diagnosis Date Noted  . Bipolar depression (HCC) [F31.30] 06/26/2016  . Bipolar I disorder, most recent episode depressed (HCC) [F31.30]   . Major depressive disorder, recurrent severe without psychotic features (HCC) [F33.2] 06/15/2016  . Major depressive disorder, single episode, severe without psychosis (HCC) [F32.2] 06/15/2016  . Normal labor and delivery [O80] 11/23/2010   Total Time spent with patient: 30 minutes  Past Psychiatric History:   Past Medical History:  Past Medical History:  Diagnosis Date  . Anxiety   . Bipolar 1 disorder (HCC)   . Costochondritis   . Depression   . History of kidney stones   . PONV (postoperative nausea and vomiting)     Past Surgical History:  Procedure Laterality Date  . APPENDECTOMY    . CHOLECYSTECTOMY    . LAPAROSCOPY N/A 04/16/2015   Procedure: LAPAROSCOPY DIAGNOSTIC;  Surgeon: Levi Aland, MD;  Location: [facility];  Service: Gynecology;  Laterality: N/A;   Family History:  Family History  Problem Relation Age of Onset  . Alcohol abuse Father   . Depression Father   . Mental illness Father   . Drug abuse Father   . Depression Mother   . Birth defects Sister   . Depression Maternal Grandmother   . Heart disease Maternal Grandmother   . Hypertension Maternal Grandmother   . Depression Maternal Grandfather   . Heart disease Maternal Grandfather   . Hypertension Maternal Grandfather   . Depression Paternal Grandmother   . Heart disease Paternal  Grandmother   . Hypertension Paternal Grandmother   . Depression Paternal Grandfather   . Heart disease Paternal Grandfather   . Cancer Paternal Grandfather   . Alcohol abuse Paternal Grandfather   . Hypertension Paternal Grandfather    Family Psychiatric  History:  Social History:  History  Alcohol Use  . Yes     History  Drug Use No    Social History   Social History  . Marital status: Single    Spouse name: N/A  . Number of children: N/A  . Years of education: N/A   Social History Main Topics  . Smoking status: Never Smoker  . Smokeless tobacco: Never Used  . Alcohol use Yes  . Drug use: No  . Sexual activity: Yes    Birth control/ protection: None   Other Topics Concern  . None   Social History Narrative  . None   Additional Social History:                         Sleep: Good  Appetite:  Fair  Current Medications: Current Facility-Administered Medications  Medication Dose Route Frequency Provider Last Rate Last Dose  . acetaminophen (TYLENOL) tablet 650 mg  650 mg Oral Q6H PRN Audery Amel, MD      . alum & mag hydroxide-simeth (MAALOX/MYLANTA) 200-200-20 MG/5ML suspension 30 mL  30 mL Oral Q4H PRN Audery Amel, MD      . hydrOXYzine (ATARAX/VISTARIL) tablet 25 mg  25 mg Oral TID PRN Audery Amel, MD      . liothyronine (CYTOMEL) tablet 25 mcg  25 mcg Oral Daily Audery Amel, MD   25 mcg at 06/28/16 0836  . magnesium hydroxide (MILK OF MAGNESIA) suspension 30 mL  30 mL Oral Daily PRN Audery Amel, MD      . multivitamin-lutein (OCUVITE-LUTEIN) capsule 1 capsule  1 capsule Oral Daily Audery Amel, MD   1 capsule at 06/28/16 0836  . QUEtiapine (SEROQUEL) tablet 300 mg  300 mg Oral QHS Beverly Sessions, MD   300 mg at 06/27/16 2140  . topiramate (TOPAMAX) tablet 50 mg  50 mg Oral BID Audery Amel, MD   50 mg at 06/28/16 0836  . traZODone (DESYREL) tablet 100 mg  100 mg Oral QHS PRN Audery Amel, MD      . venlafaxine XR (EFFEXOR-XR)  24 hr capsule 150 mg  150 mg Oral Q breakfast Audery Amel, MD   150 mg at 06/28/16 1610    Lab Results: No results found for this or any previous visit (from the past 48 hour(s)).  Blood Alcohol level:  Lab Results  Component Value Date   ETH <5 06/14/2016    Metabolic Disorder Labs: Lab Results  Component Value Date   HGBA1C 4.7 (L) 06/17/2016   MPG 88 06/17/2016   Lab Results  Component Value Date   PROLACTIN 60.7 (H) 06/17/2016   Lab Results  Component Value Date   CHOL 118 06/17/2016   TRIG 100 06/17/2016   HDL 33 (L) 06/17/2016   CHOLHDL 3.6 06/17/2016   VLDL 20 06/17/2016   LDLCALC 65 06/17/2016    Physical Findings: AIMS: Facial and Oral Movements Muscles of Facial Expression: None, normal Lips and Perioral Area: None, normal Jaw: None, normal Tongue: None, normal,Extremity Movements Upper (arms, wrists, hands, fingers): None, normal Lower (legs, knees, ankles, toes): None, normal, Trunk Movements Neck, shoulders, hips: None, normal, Overall Severity Severity of abnormal movements (highest score from questions above): None, normal Incapacitation due to abnormal movements: None, normal Patient's awareness of abnormal movements (rate only patient's report): No Awareness, Dental Status Current problems with teeth and/or dentures?: No Does patient usually wear dentures?: No  CIWA:    COWS:     Musculoskeletal: Strength & Muscle Tone: nwl Gait & Station: steady gait, no EPS Patient leans:   Psychiatric Specialty Exam: Physical Exam  Nursing note and vitals reviewed.   Review of Systems  Psychiatric/Behavioral: Positive for depression and suicidal ideas. The patient is nervous/anxious.     Blood pressure 136/74, pulse (S) (!) 120, temperature 98.2 F (36.8 C), temperature source Oral, resp. rate (!) 22, height 5\' 4"  (1.626 m), weight 69.9 kg (154 lb), last menstrual period 06/12/2016, SpO2 100 %, unknown if currently breastfeeding.Body mass index is  26.43 kg/m.  General Appearance: poorly grommed  Eye Contact:  poor  Speech:  Slow  Volume:  Decreased  Mood:  Anxious and Depressed  Affect:  Anxious, dysphoric  Thought Process:  Goal Directed  Orientation:  Full (Time, Place, and Person)  Thought Content:  Logical  Suicidal Thoughts:  Yes.  without intent/plan  Homicidal Thoughts:  No  Memory:  intact  Judgement:  limited  Insight:  Fair  Psychomotor Activity:  Normal  Concentration:  fair  Recall:  Fair  Fund of Knowledge:  Good  Language:  Good  Akathisia:  No  Handed:    AIMS (if indicated):  Assets:  Cooperative with tx  ADL's:    Cognition:  memory intact  Sleep:       Treatment Plan Summary: Daily contact with patient to assess and evaluate symptoms and progress in treatment and Medication management and planned ECT.   [age] year old woman with severe major depressive episode as part of what appears to be a bipolar disorder.  Pt tachycardic,  but BP is wnl,   Will order EKG, may need medical consult. Will monitor VS.  NPO after midnight for ECT.  Depression-  Cont  Seroquel and Effexor, plan ECT on Monday. Patient was educated about ECT with a full description of the risks and benefits and alternatives, She is agreeable  to ECT treatment.   History of migraines - topiramate .  Beverly SessionsJagannath Elif Yonts, MD 06/28/2016, 12:23 PMPatient ID: [name], female   DOB: 19-May-1990, 26 y.o.   MRN: [id number]

## 2016-06-29 ENCOUNTER — Inpatient Hospital Stay: Payer: 59 | Admitting: Certified Registered"

## 2016-06-29 ENCOUNTER — Other Ambulatory Visit: Payer: Self-pay | Admitting: Psychiatry

## 2016-06-29 ENCOUNTER — Encounter: Payer: Self-pay | Admitting: *Deleted

## 2016-06-29 LAB — GLUCOSE, CAPILLARY: GLUCOSE-CAPILLARY: 87 mg/dL (ref 65–99)

## 2016-06-29 MED ORDER — SODIUM CHLORIDE 0.9 % IV SOLN
INTRAVENOUS | Status: DC | PRN
  Administered 2016-06-29: 10:00:00 via INTRAVENOUS

## 2016-06-29 MED ORDER — ONDANSETRON HCL 4 MG PO TABS
ORAL_TABLET | ORAL | Status: AC
  Filled 2016-06-29: qty 1

## 2016-06-29 MED ORDER — PROMETHAZINE HCL 25 MG PO TABS
12.5000 mg | ORAL_TABLET | Freq: Once | ORAL | Status: DC
  Filled 2016-06-29: qty 1

## 2016-06-29 MED ORDER — SODIUM CHLORIDE 0.9 % IV SOLN
500.0000 mL | Freq: Once | INTRAVENOUS | Status: AC
  Administered 2016-06-29: 1000 mL via INTRAVENOUS

## 2016-06-29 MED ORDER — SUCCINYLCHOLINE CHLORIDE 20 MG/ML IJ SOLN
INTRAMUSCULAR | Status: DC | PRN
  Administered 2016-06-29: 80 mg via INTRAVENOUS

## 2016-06-29 MED ORDER — IBUPROFEN 600 MG PO TABS
600.0000 mg | ORAL_TABLET | Freq: Four times a day (QID) | ORAL | Status: DC | PRN
  Administered 2016-06-29: 600 mg via ORAL
  Filled 2016-06-29: qty 1

## 2016-06-29 MED ORDER — ONDANSETRON HCL 4 MG PO TABS
4.0000 mg | ORAL_TABLET | Freq: Four times a day (QID) | ORAL | Status: DC | PRN
  Administered 2016-06-29: 4 mg via ORAL

## 2016-06-29 MED ORDER — QUETIAPINE FUMARATE 200 MG PO TABS
400.0000 mg | ORAL_TABLET | Freq: Every day | ORAL | Status: DC
  Administered 2016-06-29 – 2016-07-02 (×4): 400 mg via ORAL
  Filled 2016-06-29 (×4): qty 2

## 2016-06-29 MED ORDER — ONDANSETRON HCL 4 MG/2ML IJ SOLN: 4.0000 mg | Freq: Once | INTRAMUSCULAR | Status: DC | PRN

## 2016-06-29 MED ORDER — FENTANYL CITRATE (PF) 100 MCG/2ML IJ SOLN: 25.0000 ug | INTRAMUSCULAR | Status: DC | PRN

## 2016-06-29 MED ORDER — PROMETHAZINE HCL 12.5 MG PO TABS: 12.5000 mg | ORAL_TABLET | Freq: Four times a day (QID) | ORAL | Status: DC | PRN

## 2016-06-29 MED ORDER — SUCCINYLCHOLINE CHLORIDE 20 MG/ML IJ SOLN
INTRAMUSCULAR | Status: AC
  Filled 2016-06-29: qty 1

## 2016-06-29 MED ORDER — METHOHEXITAL SODIUM 100 MG/10ML IV SOSY
PREFILLED_SYRINGE | INTRAVENOUS | Status: DC | PRN
  Administered 2016-06-29: 70 mg via INTRAVENOUS

## 2016-06-29 MED ORDER — KETOROLAC TROMETHAMINE 30 MG/ML IJ SOLN
30.0000 mg | Freq: Once | INTRAMUSCULAR | Status: AC
  Administered 2016-06-29: 30 mg via INTRAVENOUS

## 2016-06-29 MED ORDER — KETOROLAC TROMETHAMINE 30 MG/ML IJ SOLN
INTRAMUSCULAR | Status: AC
  Administered 2016-06-29: 30 mg via INTRAVENOUS
  Filled 2016-06-29: qty 1

## 2016-06-29 NOTE — [facility] Group Notes (Signed)
[facility] Group Notes:  (Nursing/MHT/Case Management/Adjunct)  Date:  06/29/2016  Time:  1:12 AM  Type of Therapy:  Group Therapy  Participation Level:  Did Not Attend   [name] Isbell 06/29/2016, 1:12 AM

## 2016-06-29 NOTE — Progress Notes (Signed)
Recreation Therapy Notes  At approximately 2:30 pm, LRT attempted assessment. Patient reported she did not feel well from ECT and her mind was foggy. Patient requested for LRT to come back tomorrow.  [name],[place]serrath Madding M, LRT/CTRS 06/29/2016 3:45 PM

## 2016-06-29 NOTE — [facility] Group Notes (Signed)
[facility] Group Notes:  (Nursing/MHT/Case Management/Adjunct)  Date:  06/29/2016  Time:  10:20 PM  Type of Therapy:    Participation Level:  Did Not Attend  Participation Quality Summary of Progress/Problems:  [name] 06/29/2016, 10:20 PM

## 2016-06-29 NOTE — Anesthesia Postprocedure Evaluation (Signed)
Anesthesia Post Note  Patient: [name]  Procedure(s) Performed: * No procedures listed *  Patient location during evaluation: PACU Anesthesia Type: General Level of consciousness: awake and alert and oriented Pain management: pain level controlled Vital Signs Assessment: post-procedure vital signs reviewed and stable Respiratory status: spontaneous breathing Cardiovascular status: blood pressure returned to baseline Anesthetic complications: no     Last Vitals:  Vitals:   06/29/16 1208 06/29/16 1213  BP: 125/68   Pulse: (!) 114   Resp: 19 (!) 25  Temp: 36.9 C     Last Pain:  Vitals:   06/29/16 1002  TempSrc: Oral  PainSc:                  [name]

## 2016-06-29 NOTE — H&P (Signed)
[name] is an 26 y.o. female.   Chief Complaint: Major depression severe HPI: Patient with recurrent severe depression with suicidal ideation currently starting ECT treatment  Past Medical History:  Diagnosis Date  . Anxiety   . Bipolar 1 disorder (HCC)   . Costochondritis   . Depression   . History of kidney stones   . PONV (postoperative nausea and vomiting)     Past Surgical History:  Procedure Laterality Date  . APPENDECTOMY    . CHOLECYSTECTOMY    . LAPAROSCOPY N/A 04/16/2015   Procedure: LAPAROSCOPY DIAGNOSTIC;  Surgeon: Levi Aland, MD;  Location: [facility];  Service: Gynecology;  Laterality: N/A;    Family History  Problem Relation Age of Onset  . Alcohol abuse Father   . Depression Father   . Mental illness Father   . Drug abuse Father   . Depression Mother   . Birth defects Sister   . Depression Maternal Grandmother   . Heart disease Maternal Grandmother   . Hypertension Maternal Grandmother   . Depression Maternal Grandfather   . Heart disease Maternal Grandfather   . Hypertension Maternal Grandfather   . Depression Paternal Grandmother   . Heart disease Paternal Grandmother   . Hypertension Paternal Grandmother   . Depression Paternal Grandfather   . Heart disease Paternal Grandfather   . Cancer Paternal Grandfather   . Alcohol abuse Paternal Grandfather   . Hypertension Paternal Grandfather    Social History:  reports that she has never smoked. She has never used smokeless tobacco. She reports that she drinks alcohol. She reports that she does not use drugs.  Allergies:  Allergies  Allergen Reactions  . Phenergan [Promethazine Hcl] Other (See Comments)    Reaction:  Shaking     Medications Prior to Admission  Medication Sig Dispense Refill  . acetaminophen (TYLENOL) 325 MG tablet Take 2 tablets (650 mg total) by mouth every 6 (six) hours as needed for mild pain.    Marland Kitchen alum & mag hydroxide-simeth (MAALOX/MYLANTA) 200-200-20 MG/5ML  suspension Take 30 mLs by mouth every 4 (four) hours as needed for indigestion. 355 mL 0  . hydrOXYzine (ATARAX/VISTARIL) 25 MG tablet Take 1 tablet (25 mg total) by mouth every 6 (six) hours as needed (anxiety / agitateion). 30 tablet 0  . ibuprofen (ADVIL,MOTRIN) 600 MG tablet Take 1 tablet (600 mg total) by mouth every 6 (six) hours as needed for fever, headache or moderate pain. 30 tablet 0  . lamoTRIgine (LAMICTAL) 25 MG tablet Take 1 tablet (25 mg total) by mouth 2 (two) times daily. For mood stabilization    . liothyronine (CYTOMEL) 25 MCG tablet Take 1 tablet (25 mcg total) by mouth daily. For Hypothyroidism.    Marland Kitchen LORazepam (ATIVAN) 0.5 MG tablet Take 1 tablet (0.5 mg total) by mouth every 6 (six) hours as needed for anxiety. 30 tablet 0  . magnesium hydroxide (MILK OF MAGNESIA) 400 MG/5ML suspension Take 30 mLs by mouth daily as needed for mild constipation. 360 mL 0  . Multiple Vitamins-Minerals (MULTIVITAMIN GUMMIES ADULT) CHEW Chew 1 each by mouth 2 (two) times daily. Vitamin replacement    . QUEtiapine (SEROQUEL) 400 MG tablet Take 1 tablet (400 mg total) by mouth at bedtime. For mood control    . thiamine 100 MG tablet Take 1 tablet (100 mg total) by mouth daily. For low thiamine    . topiramate (TOPAMAX) 50 MG tablet Take 1 tablet (50 mg total) by mouth 2 (two) times daily.  For mood stabilization    . venlafaxine XR (EFFEXOR-XR) 37.5 MG 24 hr capsule Take 3 capsules (112.5 mg total) by mouth daily with breakfast. For depression      Results for orders placed or performed during the hospital encounter of 06/26/16 (from the past 48 hour(s))  Glucose, capillary     Status: None   Collection Time: 06/29/16  6:43 AM  Result Value Ref Range   Glucose-Capillary 87 65 - 99 mg/dL   No results found.  Review of Systems  Constitutional: Negative.   HENT: Negative.   Eyes: Negative.   Respiratory: Negative.   Cardiovascular: Negative.   Gastrointestinal: Negative.   Musculoskeletal:  Negative.   Skin: Negative.   Neurological: Negative.   Psychiatric/Behavioral: Positive for depression and suicidal ideas. Negative for hallucinations, memory loss and substance abuse. The patient is nervous/anxious and has insomnia.     Blood pressure 109/64, pulse 91, temperature 98.4 F (36.9 C), temperature source Oral, resp. rate 18, height 5\' 4"  (1.626 m), weight 69.9 kg (154 lb), last menstrual period 06/12/2016, SpO2 99 %, unknown if currently breastfeeding. Physical Exam  Nursing note and vitals reviewed. Constitutional: She appears well-developed and well-nourished.  HENT:  Head: Normocephalic and atraumatic.  Eyes: Conjunctivae are normal. Pupils are equal, round, and reactive to light.  Neck: Normal range of motion.  Cardiovascular: Normal heart sounds.   Respiratory: Effort normal.  GI: Soft.  Musculoskeletal: Normal range of motion.  Neurological: She is alert.  Skin: Skin is warm and dry.  Psychiatric: Judgment normal. Her affect is blunt. Her speech is delayed. She is slowed and withdrawn. Cognition and memory are normal. She exhibits a depressed mood. She expresses suicidal ideation. She expresses no homicidal ideation.     Assessment/Plan ECT today right unilateral beginning a index course  Mordecai RasmussenJohn Chaunce Winkels, MD 06/29/2016, 11:31 AM

## 2016-06-29 NOTE — Anesthesia Preprocedure Evaluation (Signed)
Anesthesia Evaluation  Patient identified by MRN, date of birth, ID band Patient awake    Reviewed: Allergy & Precautions, H&P , NPO status , Patient's Chart, lab work & pertinent test results  History of Anesthesia Complications (+) PONV and history of anesthetic complications  Airway Mallampati: II  TM Distance: >3 FB Neck ROM: full    Dental no notable dental hx. (+) Teeth Intact   Pulmonary neg pulmonary ROS,    Pulmonary exam normal        Cardiovascular negative cardio ROS Normal cardiovascular exam     Neuro/Psych PSYCHIATRIC DISORDERS Anxiety Depression Bipolar Disorder negative neurological ROS     GI/Hepatic negative GI ROS, Neg liver ROS,   Endo/Other  negative endocrine ROS  Renal/GU negative Renal ROS     Musculoskeletal   Abdominal Normal abdominal exam  (+)   Peds  Hematology negative hematology ROS (+)   Anesthesia Other Findings   Reproductive/Obstetrics negative OB ROS                             Anesthesia Physical  Anesthesia Plan  ASA: II  Anesthesia Plan: General   Post-op Pain Management:    Induction: Intravenous  Airway Management Planned: Mask  Additional Equipment:   Intra-op Plan:   Post-operative Plan:   Informed Consent: I have reviewed the patients History and Physical, chart, labs and discussed the procedure including the risks, benefits and alternatives for the proposed anesthesia with the patient or authorized representative who has indicated his/her understanding and acceptance.   Dental Advisory Given  Plan Discussed with: CRNA and Surgeon  Anesthesia Plan Comments:         Anesthesia Quick Evaluation

## 2016-06-29 NOTE — Procedures (Signed)
ECT SERVICES Physician's Interval Evaluation & Treatment Note  Patient Identification: [name] MRN:  [id number] Date of Evaluation:  06/29/2016 TX #: 1  MADRS: 39  MMSE: 30  P.E. Findings:  Physical exam unremarkable  Psychiatric Interval Note:  Major depression severe recurrent very withdrawn and negative  Subjective:  Patient is a 26 y.o. female seen for evaluation for Electroconvulsive Therapy. Depression  Treatment Summary:   [x]   Right Unilateral             []  Bilateral   % Energy : 0.3 ms 30%   Impedance: 2160 ohms  Seizure Energy Index: 21,018 V squared  Postictal Suppression Index: 96%  Seizure Concordance Index: 98%  Medications  Pre Shock: Brevital 70 mg, succinylcholine 80 mg  Post Shock:    Seizure Duration: 28 seconds by EMG 50 seconds by EEG   Comments: Follow-up Wednesday and Friday   Lungs:  [x]   Clear to auscultation               []  Other:   Heart:    [x]   Regular rhythm             []  irregular rhythm    [x]   Previous H&P reviewed, patient examined and there are NO CHANGES                 []   Previous H&P reviewed, patient examined and there are changes noted.   Mordecai RasmussenJohn Cecia Egge, MD 2/19/201811:33 AM

## 2016-06-29 NOTE — Progress Notes (Signed)
Recreation Therapy Notes  Date: 02.19.18 Time: 1:00 pm Location: Day Room on [facility]   Group Topic: Wellness   Goal Area(s) Addresses:  Patient will identify at least one item per dimension of health. Patient will examine areas they are deficient in.   Behavioral Response: Did not attend   Intervention: 6 Dimensions of Health   Activity: Patients were a definition sheet with the 6 Dimensions of Health and a worksheet with each dimension listed. Patients were instructed to write items they were currently doing in each dimension.   Education: LRT educated patients on ways to improve each dimension of health.   Education Outcome: Patient did not attend group.   Clinical Observations/Feedback: Patient did not attend group.  [name],Caia Lofaro M, LRT/CTRS 06/29/2016 2:22 PM

## 2016-06-29 NOTE — Transfer of Care (Signed)
Immediate Anesthesia Transfer of Care Note  Patient: Titus DubinKaitlyn L Abplanalp  Procedure(s) Performed: * No procedures listed *  Patient Location: PACU  Anesthesia Type:General  Level of Consciousness: sedated  Airway & Oxygen Therapy: Patient Spontanous Breathing and Patient connected to face mask oxygen  Post-op Assessment: Report given to RN and Post -op Vital signs reviewed and stable  Post vital signs: Reviewed and stable  Last Vitals:  Vitals:   06/29/16 1002 06/29/16 1149  BP: 109/64 132/84  Pulse: 91 92  Resp: 18 15  Temp: 36.9 C     Last Pain:  Vitals:   06/29/16 1002  TempSrc: Oral  PainSc:          Complications: No apparent anesthesia complications

## 2016-06-29 NOTE — Anesthesia Post-op Follow-up Note (Cosign Needed)
Anesthesia QCDR form completed.        

## 2016-06-29 NOTE — Progress Notes (Signed)
Pt denies SI/HI/AVH. Verbalized understanding of remaining NPO after midnight. Voices concern over decrease of seroquel to 300 mgs. Medication compliant. PRN Vistaril given for anxiety, effective. Denies pain. Voices no additional concerns at this time. Safety maintained. Will continue to monitor.

## 2016-06-29 NOTE — Progress Notes (Signed)
When patient came back from ECT patient verbalized "I have a recent memory loss."Patient is oriented x4.Patient C/O nausea and headache.PRNs given.Compliant with medications.Appetite fair & energy level fair.Support & encouragement given.

## 2016-06-29 NOTE — Progress Notes (Signed)
[facility] MD Progress Note  06/29/2016 6:49 PM [name]  MRN:  [id number] Subjective:  Follow-up for 26 year old woman with severe depression currently in the hospital for treatment including ECT Principal Problem: Bipolar depression (HCC) Diagnosis:   Patient Active Problem List   Diagnosis Date Noted  . Bipolar depression (HCC) [F31.30] 06/26/2016  . Bipolar I disorder, most recent episode depressed (HCC) [F31.30]   . Major depressive disorder, recurrent severe without psychotic features (HCC) [F33.2] 06/15/2016  . Major depressive disorder, single episode, severe without psychosis (HCC) [F32.2] 06/15/2016  . Normal labor and delivery [O80] 11/23/2010   Total Time spent with patient: 30 minutes  Past Psychiatric History: Patient has a history of major depression recurrent and also probably psychotic manic episodes at some point with a diagnosis of bipolar depression.  Past Medical History:  Past Medical History:  Diagnosis Date  . Anxiety   . Bipolar 1 disorder (HCC)   . Costochondritis   . Depression   . History of kidney stones   . PONV (postoperative nausea and vomiting)     Past Surgical History:  Procedure Laterality Date  . APPENDECTOMY    . CHOLECYSTECTOMY    . LAPAROSCOPY N/A 04/16/2015   Procedure: LAPAROSCOPY DIAGNOSTIC;  Surgeon: Levi Aland, MD;  Location: [facility];  Service: Gynecology;  Laterality: N/A;   Family History:  Family History  Problem Relation Age of Onset  . Alcohol abuse Father   . Depression Father   . Mental illness Father   . Drug abuse Father   . Depression Mother   . Birth defects Sister   . Depression Maternal Grandmother   . Heart disease Maternal Grandmother   . Hypertension Maternal Grandmother   . Depression Maternal Grandfather   . Heart disease Maternal Grandfather   . Hypertension Maternal Grandfather   . Depression Paternal Grandmother   . Heart disease Paternal Grandmother   . Hypertension Paternal Grandmother   .  Depression Paternal Grandfather   . Heart disease Paternal Grandfather   . Cancer Paternal Grandfather   . Alcohol abuse Paternal Grandfather   . Hypertension Paternal Grandfather    Family Psychiatric  History: Depression Social History:  History  Alcohol Use  . Yes     History  Drug Use No    Social History   Social History  . Marital status: Single    Spouse name: N/A  . Number of children: N/A  . Years of education: N/A   Social History Main Topics  . Smoking status: Never Smoker  . Smokeless tobacco: Never Used  . Alcohol use Yes  . Drug use: No  . Sexual activity: Yes    Birth control/ protection: None   Other Topics Concern  . None   Social History Narrative  . None   Additional Social History:                         Sleep: Fair  Appetite:  Fair  Current Medications: Current Facility-Administered Medications  Medication Dose Route Frequency Provider Last Rate Last Dose  . acetaminophen (TYLENOL) tablet 650 mg  650 mg Oral Q6H PRN Audery Amel, MD   650 mg at 06/29/16 1426  . alum & mag hydroxide-simeth (MAALOX/MYLANTA) 200-200-20 MG/5ML suspension 30 mL  30 mL Oral Q4H PRN Audery Amel, MD      . hydrOXYzine (ATARAX/VISTARIL) tablet 25 mg  25 mg Oral TID PRN Audery Amel, MD  25 mg at 06/28/16 2141  . ibuprofen (ADVIL,MOTRIN) tablet 600 mg  600 mg Oral Q6H PRN Audery AmelJohn T Clapacs, MD      . liothyronine (CYTOMEL) tablet 25 mcg  25 mcg Oral Daily Audery AmelJohn T Clapacs, MD   25 mcg at 06/29/16 1327  . magnesium hydroxide (MILK OF MAGNESIA) suspension 30 mL  30 mL Oral Daily PRN Audery AmelJohn T Clapacs, MD      . multivitamin-lutein (OCUVITE-LUTEIN) capsule 1 capsule  1 capsule Oral Daily Audery AmelJohn T Clapacs, MD   1 capsule at 06/29/16 1327  . ondansetron (ZOFRAN) 4 MG tablet           . ondansetron (ZOFRAN) tablet 4 mg  4 mg Oral Q6H PRN Audery AmelJohn T Clapacs, MD   4 mg at 06/29/16 1328  . promethazine (PHENERGAN) tablet 12.5 mg  12.5 mg Oral Once Audery AmelJohn T Clapacs, MD       . QUEtiapine (SEROQUEL) tablet 400 mg  400 mg Oral QHS Audery AmelJohn T Clapacs, MD      . topiramate (TOPAMAX) tablet 50 mg  50 mg Oral BID Audery AmelJohn T Clapacs, MD   50 mg at 06/29/16 1637  . traZODone (DESYREL) tablet 100 mg  100 mg Oral QHS PRN Audery AmelJohn T Clapacs, MD      . venlafaxine XR (EFFEXOR-XR) 24 hr capsule 150 mg  150 mg Oral Q breakfast Audery AmelJohn T Clapacs, MD   150 mg at 06/29/16 1328    Lab Results:  Results for orders placed or performed during the hospital encounter of 06/26/16 (from the past 48 hour(s))  Glucose, capillary     Status: None   Collection Time: 06/29/16  6:43 AM  Result Value Ref Range   Glucose-Capillary 87 65 - 99 mg/dL    Blood Alcohol level:  Lab Results  Component Value Date   ETH <5 06/14/2016    Metabolic Disorder Labs: Lab Results  Component Value Date   HGBA1C 4.7 (L) 06/17/2016   MPG 88 06/17/2016   Lab Results  Component Value Date   PROLACTIN 60.7 (H) 06/17/2016   Lab Results  Component Value Date   CHOL 118 06/17/2016   TRIG 100 06/17/2016   HDL 33 (L) 06/17/2016   CHOLHDL 3.6 06/17/2016   VLDL 20 06/17/2016   LDLCALC 65 06/17/2016    Physical Findings: AIMS: Facial and Oral Movements Muscles of Facial Expression: None, normal Lips and Perioral Area: None, normal Jaw: None, normal Tongue: None, normal,Extremity Movements Upper (arms, wrists, hands, fingers): None, normal Lower (legs, knees, ankles, toes): None, normal, Trunk Movements Neck, shoulders, hips: None, normal, Overall Severity Severity of abnormal movements (highest score from questions above): None, normal Incapacitation due to abnormal movements: None, normal Patient's awareness of abnormal movements (rate only patient's report): No Awareness, Dental Status Current problems with teeth and/or dentures?: No Does patient usually wear dentures?: No  CIWA:    COWS:     Musculoskeletal: Strength & Muscle Tone: within normal limits Gait & Station: normal Patient leans:  N/A  Psychiatric Specialty Exam: Physical Exam  Nursing note and vitals reviewed. Constitutional: She appears well-developed and well-nourished.  HENT:  Head: Normocephalic and atraumatic.  Eyes: Conjunctivae are normal. Pupils are equal, round, and reactive to light.  Neck: Normal range of motion.  Cardiovascular: Regular rhythm and normal heart sounds.   Respiratory: Effort normal. No respiratory distress.  GI: Soft.  Musculoskeletal: Normal range of motion.  Neurological: She is alert.  Skin: Skin is warm and dry.  Psychiatric:  Judgment normal. Her speech is delayed. She is slowed. Cognition and memory are normal. She exhibits a depressed mood. She expresses suicidal ideation. She expresses no suicidal plans.    Review of Systems  Constitutional: Negative.   HENT: Negative.   Eyes: Negative.   Respiratory: Negative.   Cardiovascular: Negative.   Gastrointestinal: Negative.   Musculoskeletal: Negative.   Skin: Negative.   Neurological: Positive for headaches.  Psychiatric/Behavioral: Positive for depression and suicidal ideas. Negative for hallucinations, memory loss and substance abuse. The patient is not nervous/anxious and does not have insomnia.     Blood pressure (!) 106/59, pulse 96, temperature 98.4 F (36.9 C), temperature source Oral, resp. rate 18, height 5\' 4"  (1.626 m), weight 69.9 kg (154 lb), last menstrual period 06/12/2016, SpO2 96 %, unknown if currently breastfeeding.Body mass index is 26.43 kg/m.  General Appearance: Casual  Eye Contact:  Minimal  Speech:  Slow  Volume:  Decreased  Mood:  Depressed  Affect:  Blunt  Thought Process:  Goal Directed  Orientation:  Full (Time, Place, and Person)  Thought Content:  Logical  Suicidal Thoughts:  Yes.  without intent/plan  Homicidal Thoughts:  No  Memory:  Immediate;   Good Recent;   Fair Remote;   Fair  Judgement:  Good  Insight:  Good  Psychomotor Activity:  Decreased  Concentration:  Concentration:  Fair  Recall:  [employer] of Knowledge:  Fair  Language:  Fair  Akathisia:  No  Handed:  Right  AIMS (if indicated):     Assets:  Desire for Improvement Housing Physical Health  ADL's:  Intact  Cognition:  WNL  Sleep:  Number of Hours: 6.3     Treatment Plan Summary: Daily contact with patient to assess and evaluate symptoms and progress in treatment, Medication management and Plan Patient is receiving ECT and had her first right unilateral treatment this morning. Treatment went well. Well-tolerated except for headache and nausea afterwards which responded to Zofran and acetaminophen. Adding Motrin for headache as well as acetaminophen as a when necessary. Patient is requesting her Seroquel be back up to 400 mg the more effective dose she was taking previously. Dose changed. No other change to medication today. Supportive therapy and counseling and review of treatment plan. Next ECT Wednesday.  Mordecai Rasmussen, MD 06/29/2016, 6:49 PM

## 2016-06-30 NOTE — Progress Notes (Addendum)
Patient with depressed affect, poor eye contact, quiet speech. No SI/HI at this time. Patient s/p ECT #1. Minimal interaction with peers. Withdrawn when staff initiates interaction. Unable to state goal for today and when therapy groups encouraged patient states " I tried one group the other day and with my depression and anxiety I can't do groups". Patient rests in room. Safety maintained. Review prn meds with patient.

## 2016-06-30 NOTE — Progress Notes (Signed)
Pt awake, alert, oriented and up on unit tonight before bedtime. Pt observed visiting in dayroom with visitor, appropriately interacting, smiling. After visitor left, pt observed in dayroom sitting to self, no interaction with peers. Somewhat avoidant when approached by this nurse. Appears anxious, sad. Denies SI/HI/AVH. Continues to endorse depression and reports looking forward to ECT scheduled for tomorrow. Denies any changes after first treatment which was Monday. Pt is medication compliant. Support and encouragement provided. Medications administered as ordered with education. Safety maintained with every 15 minute checks. Will continue to monitor.

## 2016-06-30 NOTE — Progress Notes (Signed)
Recreation Therapy Notes  Date: 02.20.18 Time: 3:00 pm Location: Day Room on [facility]  Group Topic: Self-expression  Goal Area(s) Addresses:  Patient will be able to identify a color that represents each emotion. Patient will verbalize benefit of using art as a means of self-expression. Patient will verbalize one emotion experienced while participating in activity.  Behavioral Response: Attentive, Interactive  Intervention: The Colors Within Me  Activity: Patients were given a blank face worksheet and were instructed to pick a color for each emotion they were feeling and show on the worksheet how much of that emotion they were feeling.  Education: LRT educated patients on different forms of self-expression.  Education Outcome: Acknowledges education/In group clarification offered   Clinical Observations/Feedback: Patient picked a color for each emotion she was feeling and showed on the worksheet how much of that emotion she was feeling. Patient contributed to group discussion by stating what emotions she was feeling.  [name],Nakhia Levitan M, LRT/CTRS 06/30/2016 4:08 PM

## 2016-06-30 NOTE — Plan of Care (Signed)
Problem: Coping: Goal: Ability to cope will improve Outcome: Not Met (add Reason) Pt denies changes since first ECT treatment. Pt is medication compliant.

## 2016-06-30 NOTE — Plan of Care (Signed)
Problem: Activity: Goal: Sleeping patterns will improve Outcome: Progressing Patient slept for Estimated Hours of 7.45; every 15 minutes safety round maintained, no injury or falls during this shift.    

## 2016-06-30 NOTE — Progress Notes (Signed)
[facility] MD Progress Note  06/30/2016 9:10 PM [name]  MRN:  [id number] Subjective:  Follow-up for 26 year old woman with severe depression currently in the hospital for treatment including ECT  Patient seen for follow-up today Tuesday the 20th. No new complaints. Slept better last night with increased dose of Seroquel. Affect still flat but patient has been cooperative. No inappropriate behavior. Tolerating ECT well. This evening she was visiting and seemed to be enjoying having a visitor. Principal Problem: Bipolar depression (HCC) Diagnosis:   Patient Active Problem List   Diagnosis Date Noted  . Bipolar depression (HCC) [F31.30] 06/26/2016  . Bipolar I disorder, most recent episode depressed (HCC) [F31.30]   . Major depressive disorder, recurrent severe without psychotic features (HCC) [F33.2] 06/15/2016  . Major depressive disorder, single episode, severe without psychosis (HCC) [F32.2] 06/15/2016  . Normal labor and delivery [O80] 11/23/2010   Total Time spent with patient: 30 minutes  Past Psychiatric History: Patient has a history of major depression recurrent and also probably psychotic manic episodes at some point with a diagnosis of bipolar depression.  Past Medical History:  Past Medical History:  Diagnosis Date  . Anxiety   . Bipolar 1 disorder (HCC)   . Costochondritis   . Depression   . History of kidney stones   . PONV (postoperative nausea and vomiting)     Past Surgical History:  Procedure Laterality Date  . APPENDECTOMY    . CHOLECYSTECTOMY    . LAPAROSCOPY N/A 04/16/2015   Procedure: LAPAROSCOPY DIAGNOSTIC;  Surgeon: Levi Aland, MD;  Location: [facility];  Service: Gynecology;  Laterality: N/A;   Family History:  Family History  Problem Relation Age of Onset  . Alcohol abuse Father   . Depression Father   . Mental illness Father   . Drug abuse Father   . Depression Mother   . Birth defects Sister   . Depression Maternal Grandmother   . Heart  disease Maternal Grandmother   . Hypertension Maternal Grandmother   . Depression Maternal Grandfather   . Heart disease Maternal Grandfather   . Hypertension Maternal Grandfather   . Depression Paternal Grandmother   . Heart disease Paternal Grandmother   . Hypertension Paternal Grandmother   . Depression Paternal Grandfather   . Heart disease Paternal Grandfather   . Cancer Paternal Grandfather   . Alcohol abuse Paternal Grandfather   . Hypertension Paternal Grandfather    Family Psychiatric  History: Depression Social History:  History  Alcohol Use  . Yes     History  Drug Use No    Social History   Social History  . Marital status: Single    Spouse name: N/A  . Number of children: N/A  . Years of education: N/A   Social History Main Topics  . Smoking status: Never Smoker  . Smokeless tobacco: Never Used  . Alcohol use Yes  . Drug use: No  . Sexual activity: Yes    Birth control/ protection: None   Other Topics Concern  . None   Social History Narrative  . None   Additional Social History:                         Sleep: Fair  Appetite:  Fair  Current Medications: Current Facility-Administered Medications  Medication Dose Route Frequency Provider Last Rate Last Dose  . acetaminophen (TYLENOL) tablet 650 mg  650 mg Oral Q6H PRN Audery Amel, MD   650 mg  at 06/29/16 1426  . alum & mag hydroxide-simeth (MAALOX/MYLANTA) 200-200-20 MG/5ML suspension 30 mL  30 mL Oral Q4H PRN Audery Amel, MD      . hydrOXYzine (ATARAX/VISTARIL) tablet 25 mg  25 mg Oral TID PRN Audery Amel, MD   25 mg at 06/28/16 2141  . ibuprofen (ADVIL,MOTRIN) tablet 600 mg  600 mg Oral Q6H PRN Audery Amel, MD   600 mg at 06/29/16 2123  . liothyronine (CYTOMEL) tablet 25 mcg  25 mcg Oral Daily Audery Amel, MD   25 mcg at 06/30/16 [phone]  . magnesium hydroxide (MILK OF MAGNESIA) suspension 30 mL  30 mL Oral Daily PRN Audery Amel, MD      . multivitamin-lutein  (OCUVITE-LUTEIN) capsule 1 capsule  1 capsule Oral Daily Audery Amel, MD   1 capsule at 06/30/16 0810  . ondansetron (ZOFRAN) tablet 4 mg  4 mg Oral Q6H PRN Audery Amel, MD   4 mg at 06/29/16 1328  . promethazine (PHENERGAN) tablet 12.5 mg  12.5 mg Oral Once Audery Amel, MD      . QUEtiapine (SEROQUEL) tablet 400 mg  400 mg Oral QHS Audery Amel, MD   400 mg at 06/29/16 2123  . topiramate (TOPAMAX) tablet 50 mg  50 mg Oral BID Audery Amel, MD   50 mg at 06/30/16 1557  . traZODone (DESYREL) tablet 100 mg  100 mg Oral QHS PRN Audery Amel, MD      . venlafaxine XR (EFFEXOR-XR) 24 hr capsule 150 mg  150 mg Oral Q breakfast Audery Amel, MD   150 mg at 06/30/16 9604    Lab Results:  Results for orders placed or performed during the hospital encounter of 06/26/16 (from the past 48 hour(s))  Glucose, capillary     Status: None   Collection Time: 06/29/16  6:43 AM  Result Value Ref Range   Glucose-Capillary 87 65 - 99 mg/dL    Blood Alcohol level:  Lab Results  Component Value Date   ETH <5 06/14/2016    Metabolic Disorder Labs: Lab Results  Component Value Date   HGBA1C 4.7 (L) 06/17/2016   MPG 88 06/17/2016   Lab Results  Component Value Date   PROLACTIN 60.7 (H) 06/17/2016   Lab Results  Component Value Date   CHOL 118 06/17/2016   TRIG 100 06/17/2016   HDL 33 (L) 06/17/2016   CHOLHDL 3.6 06/17/2016   VLDL 20 06/17/2016   LDLCALC 65 06/17/2016    Physical Findings: AIMS: Facial and Oral Movements Muscles of Facial Expression: None, normal Lips and Perioral Area: None, normal Jaw: None, normal Tongue: None, normal,Extremity Movements Upper (arms, wrists, hands, fingers): None, normal Lower (legs, knees, ankles, toes): None, normal, Trunk Movements Neck, shoulders, hips: None, normal, Overall Severity Severity of abnormal movements (highest score from questions above): None, normal Incapacitation due to abnormal movements: None, normal Patient's  awareness of abnormal movements (rate only patient's report): No Awareness, Dental Status Current problems with teeth and/or dentures?: No Does patient usually wear dentures?: No  CIWA:    COWS:     Musculoskeletal: Strength & Muscle Tone: within normal limits Gait & Station: normal Patient leans: N/A  Psychiatric Specialty Exam: Physical Exam  Nursing note and vitals reviewed. Constitutional: She appears well-developed and well-nourished.  HENT:  Head: Normocephalic and atraumatic.  Eyes: Conjunctivae are normal. Pupils are equal, round, and reactive to light.  Neck: Normal range of motion.  Cardiovascular: Regular rhythm and normal heart sounds.   Respiratory: Effort normal. No respiratory distress.  GI: Soft.  Musculoskeletal: Normal range of motion.  Neurological: She is alert.  Skin: Skin is warm and dry.  Psychiatric: Judgment normal. Her speech is delayed. She is slowed. Cognition and memory are normal. She exhibits a depressed mood. She expresses suicidal ideation. She expresses no suicidal plans.    Review of Systems  Constitutional: Negative.   HENT: Negative.   Eyes: Negative.   Respiratory: Negative.   Cardiovascular: Negative.   Gastrointestinal: Negative.   Musculoskeletal: Negative.   Skin: Negative.   Psychiatric/Behavioral: Positive for depression and suicidal ideas. Negative for hallucinations, memory loss and substance abuse. The patient is not nervous/anxious and does not have insomnia.     Blood pressure 110/64, pulse (!) 104, temperature 98.5 F (36.9 C), temperature source Oral, resp. rate 20, height 5\' 4"  (1.626 m), weight 69.9 kg (154 lb), last menstrual period 06/12/2016, SpO2 96 %, unknown if currently breastfeeding.Body mass index is 26.43 kg/m.  General Appearance: Casual  Eye Contact:  Minimal  Speech:  Slow  Volume:  Decreased  Mood:  Depressed  Affect:  Blunt  Thought Process:  Goal Directed  Orientation:  Full (Time, Place, and Person)   Thought Content:  Logical  Suicidal Thoughts:  Yes.  without intent/plan  Homicidal Thoughts:  No  Memory:  Immediate;   Good Recent;   Fair Remote;   Fair  Judgement:  Good  Insight:  Good  Psychomotor Activity:  Decreased  Concentration:  Concentration: Fair  Recall:  [employer]  Fund of Knowledge:  Fair  Language:  Fair  Akathisia:  No  Handed:  Right  AIMS (if indicated):     Assets:  Desire for Improvement Housing Physical Health  ADL's:  Intact  Cognition:  WNL  Sleep:  Number of Hours: 7.45     Treatment Plan Summary: Daily contact with patient to assess and evaluate symptoms and progress in treatment, Medication management and Plan Patient was severe bipolar depression receiving ECT. First treatment tolerated well with only typical levels of headache and nausea. Pretreatment will be provided with subsequent therapies. Next ECT scheduled for tomorrow morning. No further change to medication tonight. Patient educated and is agreeable with the plan. Continue further treatment as usual on the unit. Estimated length of stay probably in the range of 7 days still.  Mordecai RasmussenJohn Bayron Dalto, MD 06/30/2016, 9:10 PM

## 2016-06-30 NOTE — Progress Notes (Signed)
Patient ID: [name], female   DOB: Mar 02, 1991, 26 y.o.   MRN: [id number] A&Ox4, c/o 10/10 headache related to ECT, Ibuprofen 600 mg given at 2123 with full relief on reassessment; VSS except HR: ST@105 , asymptomatic; medical records reviewed, Voluntary Admission papers updated with patient and filed; patient is cheerful, appropriate, interacting with peers, denied SI/HI, denied AV/H.

## 2016-06-30 NOTE — Progress Notes (Signed)
Recreation Therapy Notes  INPATIENT RECREATION THERAPY ASSESSMENT  Patient Details Name: [name] MRN: [id number] DOB: 1990-10-29 Today's Date: 06/30/2016  Patient Stressors: Family, Relationship, Friends, Work, School, Other (Comment) No support system from family - doesn't get along with mom, dad is more of a friend than a father, tries to be a friend to her sister, arguing and drama with family that she doesn't want to be apart of; recent break-up with boyfriend who her daughter considered her dad; lack of supportive friends; works full time as a [employer] and her boss is difficult to work with; getting behind with school work; financial issues; taking care of 2 kids - one child's father is not in the picture, the other child's father gets the child every other week.  Coping Skills:   Isolate, Arguments, Avoidance, Art/Dance, Music, Sports  Personal Challenges: Communication, Decision-Making, Expressing Yourself, Problem-Solving, Relationships, Self-Esteem/Confidence, Social Interaction, Stress Management, Trusting Others  Leisure Interests (2+):  Music - Listen, Individual - Other (Comment) (Go on vacation)  Awareness of Community Resources:  Yes  Community Resources:  [place]  Current Use: Yes  If no, Barriers?:    Patient Strengths:  Personality, strong-will  Patient Identified Areas of Improvement:  Letting thngs go, trusting people  Current Recreation Participation:  Nothing  Patient Goal for Hospitalization:  To try and solve problems and get help  [place]  [place]idence[place]   Current [place] (including self-harm):  Yes  Current HI:  No  Consent to Intern Participation: N/A   [name],Lamar Naef M, LRT/CTRS 06/30/2016, 12:06 PM

## 2016-07-01 ENCOUNTER — Other Ambulatory Visit: Payer: Self-pay | Admitting: Psychiatry

## 2016-07-01 ENCOUNTER — Inpatient Hospital Stay: Payer: 59 | Admitting: Anesthesiology

## 2016-07-01 ENCOUNTER — Encounter: Payer: Self-pay | Admitting: *Deleted

## 2016-07-01 LAB — GLUCOSE, CAPILLARY: GLUCOSE-CAPILLARY: 83 mg/dL (ref 65–99)

## 2016-07-01 MED ORDER — SODIUM CHLORIDE 0.9 % IV SOLN
INTRAVENOUS | Status: DC | PRN
  Administered 2016-07-01: 12:00:00 via INTRAVENOUS

## 2016-07-01 MED ORDER — KETOROLAC TROMETHAMINE 30 MG/ML IJ SOLN
30.0000 mg | Freq: Once | INTRAMUSCULAR | Status: AC
  Administered 2016-07-01: 30 mg via INTRAVENOUS

## 2016-07-01 MED ORDER — SUCCINYLCHOLINE CHLORIDE 200 MG/10ML IV SOSY
PREFILLED_SYRINGE | INTRAVENOUS | Status: DC | PRN
  Administered 2016-07-01: 80 mg via INTRAVENOUS

## 2016-07-01 MED ORDER — SODIUM CHLORIDE 0.9 % IV SOLN
500.0000 mL | Freq: Once | INTRAVENOUS | Status: AC
  Administered 2016-07-01: 1000 mL via INTRAVENOUS

## 2016-07-01 MED ORDER — METHOHEXITAL SODIUM 100 MG/10ML IV SOSY
PREFILLED_SYRINGE | INTRAVENOUS | Status: DC | PRN
  Administered 2016-07-01: 70 mg via INTRAVENOUS

## 2016-07-01 MED ORDER — SUCCINYLCHOLINE CHLORIDE 20 MG/ML IJ SOLN
INTRAMUSCULAR | Status: AC
  Filled 2016-07-01: qty 1

## 2016-07-01 MED ORDER — KETOROLAC TROMETHAMINE 30 MG/ML IJ SOLN
INTRAMUSCULAR | Status: AC
  Administered 2016-07-01: 30 mg via INTRAVENOUS
  Filled 2016-07-01: qty 1

## 2016-07-01 NOTE — Anesthesia Preprocedure Evaluation (Signed)
Anesthesia Evaluation  Patient identified by MRN, date of birth, ID band Patient awake    Reviewed: Allergy & Precautions, H&P , NPO status , Patient's Chart, lab work & pertinent test results  History of Anesthesia Complications (+) PONV and history of anesthetic complications  Airway Mallampati: II  TM Distance: >3 FB Neck ROM: full    Dental no notable dental hx. (+) Teeth Intact   Pulmonary neg pulmonary ROS,    Pulmonary exam normal        Cardiovascular negative cardio ROS Normal cardiovascular exam     Neuro/Psych PSYCHIATRIC DISORDERS negative neurological ROS     GI/Hepatic negative GI ROS, Neg liver ROS,   Endo/Other  negative endocrine ROS  Renal/GU negative Renal ROS     Musculoskeletal   Abdominal Normal abdominal exam  (+)   Peds  Hematology negative hematology ROS (+)   Anesthesia Other Findings Past Medical History: No date: Anxiety No date: Bipolar 1 disorder (HCC) No date: Costochondritis No date: Depression No date: History of kidney stones No date: PONV (postoperative nausea and vomiting)   Reproductive/Obstetrics negative OB ROS                             Anesthesia Physical  Anesthesia Plan  ASA: II  Anesthesia Plan: General   Post-op Pain Management:    Induction: Intravenous  Airway Management Planned: Mask  Additional Equipment:   Intra-op Plan:   Post-operative Plan:   Informed Consent: I have reviewed the patients History and Physical, chart, labs and discussed the procedure including the risks, benefits and alternatives for the proposed anesthesia with the patient or authorized representative who has indicated his/her understanding and acceptance.   Dental Advisory Given  Plan Discussed with: CRNA and Surgeon  Anesthesia Plan Comments:         Anesthesia Quick Evaluation

## 2016-07-01 NOTE — Progress Notes (Signed)
Recreation Therapy Notes  Date: 02.21.18 Time: 1:00 pm Location: Craft Room   Group Topic: Self-esteem   Goal Area(s) Addresses:  Patients will be able to identify at least one positive trait about self. Patient will verbalize benefit of having a healthy self-esteem.   Behavioral Response: Did not attend   Intervention: I Am   Activity: Patients were given worksheets with the letter I on it and were instructed to write as many positive traits about themselves inside the letter.   Education: LRT educated patients on ways they can increase their self-esteem.   Education Outcome: Patient did not attend group.   Clinical Observations/Feedback: Patient did not attend group.  [name],Silveria Botz M, LRT/CTRS 07/01/2016 2:00 PM

## 2016-07-01 NOTE — Progress Notes (Signed)
Patient presents with sad flat affect. Denies SI, HI, AVH. Endorses depression. ECT today. Pt alert and orient x4 post treatment. Reports moderate headache. Prn given with good relief. Encouragement and support offered. Safety checks maintained.  Pt receptive and remains safe on unit with q 15 min checks.

## 2016-07-01 NOTE — [facility] Group Notes (Signed)
[facility] Group Notes:  (Nursing/MHT/Case Management/Adjunct)  Date:  07/01/2016  Time:  2:02 AM  Type of Therapy:  Psychoeducational Skills  Participation Level:  Active  Participation Quality:  Appropriate, Attentive and Sharing  Affect:  Appropriate  Cognitive:  Appropriate  Insight:  Appropriate and Good  Engagement in Group:  Engaged  Modes of Intervention:  Discussion, Socialization and Support  Summary of Progress/Problems:  Chancy MilroyLaquanda Y Jesly Hartmann 07/01/2016, 2:02 AM

## 2016-07-01 NOTE — [facility] Group Notes (Signed)
[facility] LCSW Group Therapy  07/01/2016 11:59 AM  Type of Therapy:  Group Therapy  Participation Level:  Patient did not attend group therapy on this date. [facility]   Summary of Progress/Problems: Emotional Regulation: Patients will identify both negative and positive emotions. They will discuss emotions they have difficulty regulating and how they impact their lives. Patients will be asked to identify healthy coping skills to combat unhealthy reactions to negative emotions.    Lainee Lehrman G. Garnette CzechSampson MSW, LCSWA 07/01/2016, 12:00 PM

## 2016-07-01 NOTE — Tx Team (Signed)
Interdisciplinary Treatment and Diagnostic Plan Update  07/01/2016 Time of Session: 2:30pm [name] MRN: [id number]  Principal Diagnosis: Bipolar depression (HCC)  Secondary Diagnoses: Principal Problem:   Bipolar depression (HCC)   Current Medications:  Current Facility-Administered Medications  Medication Dose Route Frequency Provider Last Rate Last Dose  . acetaminophen (TYLENOL) tablet 650 mg  650 mg Oral Q6H PRN Audery Amel, MD   650 mg at 07/01/16 1251  . alum & mag hydroxide-simeth (MAALOX/MYLANTA) 200-200-20 MG/5ML suspension 30 mL  30 mL Oral Q4H PRN Audery Amel, MD      . hydrOXYzine (ATARAX/VISTARIL) tablet 25 mg  25 mg Oral TID PRN Audery Amel, MD   25 mg at 06/28/16 2141  . ibuprofen (ADVIL,MOTRIN) tablet 600 mg  600 mg Oral Q6H PRN Audery Amel, MD   600 mg at 06/29/16 2123  . liothyronine (CYTOMEL) tablet 25 mcg  25 mcg Oral Daily Audery Amel, MD   25 mcg at 07/01/16 1250  . magnesium hydroxide (MILK OF MAGNESIA) suspension 30 mL  30 mL Oral Daily PRN Audery Amel, MD      . multivitamin-lutein (OCUVITE-LUTEIN) capsule 1 capsule  1 capsule Oral Daily Audery Amel, MD   1 capsule at 07/01/16 1250  . ondansetron (ZOFRAN) tablet 4 mg  4 mg Oral Q6H PRN Audery Amel, MD   4 mg at 06/29/16 1328  . promethazine (PHENERGAN) tablet 12.5 mg  12.5 mg Oral Once Audery Amel, MD      . QUEtiapine (SEROQUEL) tablet 400 mg  400 mg Oral QHS Audery Amel, MD   400 mg at 06/30/16 2123  . topiramate (TOPAMAX) tablet 50 mg  50 mg Oral BID Audery Amel, MD   50 mg at 07/01/16 1250  . traZODone (DESYREL) tablet 100 mg  100 mg Oral QHS PRN Audery Amel, MD      . venlafaxine XR (EFFEXOR-XR) 24 hr capsule 150 mg  150 mg Oral Q breakfast Audery Amel, MD   150 mg at 07/01/16 1250   PTA Medications: Prescriptions Prior to Admission  Medication Sig Dispense Refill Last Dose  . acetaminophen (TYLENOL) 325 MG tablet Take 2 tablets (650 mg total) by mouth every  6 (six) hours as needed for mild pain.     Marland Kitchen alum & mag hydroxide-simeth (MAALOX/MYLANTA) 200-200-20 MG/5ML suspension Take 30 mLs by mouth every 4 (four) hours as needed for indigestion. 355 mL 0   . hydrOXYzine (ATARAX/VISTARIL) 25 MG tablet Take 1 tablet (25 mg total) by mouth every 6 (six) hours as needed (anxiety / agitateion). 30 tablet 0   . ibuprofen (ADVIL,MOTRIN) 600 MG tablet Take 1 tablet (600 mg total) by mouth every 6 (six) hours as needed for fever, headache or moderate pain. 30 tablet 0   . lamoTRIgine (LAMICTAL) 25 MG tablet Take 1 tablet (25 mg total) by mouth 2 (two) times daily. For mood stabilization     . liothyronine (CYTOMEL) 25 MCG tablet Take 1 tablet (25 mcg total) by mouth daily. For Hypothyroidism.     Marland Kitchen LORazepam (ATIVAN) 0.5 MG tablet Take 1 tablet (0.5 mg total) by mouth every 6 (six) hours as needed for anxiety. 30 tablet 0   . magnesium hydroxide (MILK OF MAGNESIA) 400 MG/5ML suspension Take 30 mLs by mouth daily as needed for mild constipation. 360 mL 0   . Multiple Vitamins-Minerals (MULTIVITAMIN GUMMIES ADULT) CHEW Chew 1 each by mouth 2 (two) times  daily. Vitamin replacement     . QUEtiapine (SEROQUEL) 400 MG tablet Take 1 tablet (400 mg total) by mouth at bedtime. For mood control     . thiamine 100 MG tablet Take 1 tablet (100 mg total) by mouth daily. For low thiamine     . topiramate (TOPAMAX) 50 MG tablet Take 1 tablet (50 mg total) by mouth 2 (two) times daily. For mood stabilization     . venlafaxine XR (EFFEXOR-XR) 37.5 MG 24 hr capsule Take 3 capsules (112.5 mg total) by mouth daily with breakfast. For depression       Patient Stressors: Marital or family conflict Occupational concerns  Patient Strengths: Average or above average intelligence Capable of independent living Communication skills Physical Health  Treatment Modalities: Medication Management, Group therapy, Case management,  1 to 1 session with clinician, Psychoeducation, Recreational  therapy.   Physician Treatment Plan for Primary Diagnosis: Bipolar depression (HCC) Long Term Goal(s): Improvement in symptoms so as ready for discharge Improvement in symptoms so as ready for discharge   Short Term Goals: Ability to verbalize feelings will improve Ability to disclose and discuss suicidal ideas Ability to demonstrate self-control will improve Ability to maintain clinical measurements within normal limits will improve Compliance with prescribed medications will improve  Medication Management: Evaluate patient's response, side effects, and tolerance of medication regimen.  Therapeutic Interventions: 1 to 1 sessions, Unit Group sessions and Medication administration.  Evaluation of Outcomes: Progressing  Physician Treatment Plan for Secondary Diagnosis: Principal Problem:   Bipolar depression (HCC)  Long Term Goal(s): Improvement in symptoms so as ready for discharge Improvement in symptoms so as ready for discharge   Short Term Goals: Ability to verbalize feelings will improve Ability to disclose and discuss suicidal ideas Ability to demonstrate self-control will improve Ability to maintain clinical measurements within normal limits will improve Compliance with prescribed medications will improve     Medication Management: Evaluate patient's response, side effects, and tolerance of medication regimen.  Therapeutic Interventions: 1 to 1 sessions, Unit Group sessions and Medication administration.  Evaluation of Outcomes: Progressing   RN Treatment Plan for Primary Diagnosis: Bipolar depression (HCC) Long Term Goal(s): Knowledge of disease and therapeutic regimen to maintain health will improve  Short Term Goals: Ability to verbalize frustration and anger appropriately will improve, Ability to verbalize feelings will improve and Ability to disclose and discuss suicidal ideas  Medication Management: RN will administer medications as ordered by provider, will  assess and evaluate patient's response and provide education to patient for prescribed medication. RN will report any adverse and/or side effects to prescribing provider.  Therapeutic Interventions: 1 on 1 counseling sessions, Psychoeducation, Medication administration, Evaluate responses to treatment, Monitor vital signs and CBGs as ordered, Perform/monitor CIWA, COWS, AIMS and Fall Risk screenings as ordered, Perform wound care treatments as ordered.  Evaluation of Outcomes: Progressing   LCSW Treatment Plan for Primary Diagnosis: Bipolar depression (HCC) Long Term Goal(s): Safe transition to appropriate next level of care at discharge, Engage patient in therapeutic group addressing interpersonal concerns.  Short Term Goals: Engage patient in aftercare planning with referrals and resources, Increase ability to appropriately verbalize feelings, Increase emotional regulation, Facilitate acceptance of mental health diagnosis and concerns and Increase skills for wellness and recovery  Therapeutic Interventions: Assess for all discharge needs, 1 to 1 time with Social worker, Explore available resources and support systems, Assess for adequacy in community support network, Educate family and significant other(s) on suicide prevention, Complete Psychosocial Assessment, Interpersonal group therapy.  Evaluation of Outcomes: Progressing    Recreational Therapy Treatment Plan for Primary Diagnosis: Bipolar depression (HCC) Long Term Goal(s): Interest or engagement in recreation therapeutic interventions will improve  Short Term Goals: Increase self-esteem, Increase stress management  Treatment Modalities: Group Therapy and Individual Treatment Sessions  Therapeutic Interventions: Psychoeducation  Evaluation of Outcomes: Progressing   Progress in Treatment: Attending groups: Yes. Participating in groups: Yes. Taking medication as prescribed: Yes. Toleration medication: Yes. Family/Significant  other contact made: Yes, individual(s) contacted:  grandmother Patient understands diagnosis: Yes. Discussing patient identified problems/goals with staff: Yes. Medical problems stabilized or resolved: Yes. Denies suicidal/homicidal ideation: No. Issues/concerns per patient self-inventory: No. Other: n/a  New problem(s) identified: None identified at this time.   New Short Term/Long Term Goal(s): None identified at this time.   Discharge Plan or Barriers: Patient will discharge home and follow-up with outpatient services for medication management and outpatient therapy.   Reason for Continuation of Hospitalization: Depression Medication stabilization Other; describe EC treatment  Estimated Length of Stay: 7 to 10 days.  Attendees: Patient:[name] 07/01/2016 4:14 PM  Physician: Audery Amel, MD 07/01/2016 4:14 PM  Nursing: Horton Marshall, RN 07/01/2016 4:14 PM  RN Care Manager: 07/01/2016 4:14 PM  Social Worker: Fredrich Birks. Garnette Czech MSW, LCSWA 07/01/2016 4:14 PM  Recreational Therapist: Jacquelynn Cree, LRT/CTRS 07/01/2016 4:14 PM  Other:  07/01/2016 4:14 PM  Other:  07/01/2016 4:14 PM  Other: 07/01/2016 4:14 PM    Scribe for Treatment Team: Arelia Longest, LCSWA 07/01/2016 4:21 PM

## 2016-07-01 NOTE — H&P (Signed)
[name] is an 26 y.o. female.   Chief Complaint: Patient with major depression continues to be depressed no changed complaint HPI: Recurrent severe depression beginning index course today's treatment #2.  Past Medical History:  Diagnosis Date  . Anxiety   . Bipolar 1 disorder (HCC)   . Costochondritis   . Depression   . History of kidney stones   . PONV (postoperative nausea and vomiting)     Past Surgical History:  Procedure Laterality Date  . APPENDECTOMY    . CHOLECYSTECTOMY    . LAPAROSCOPY N/A 04/16/2015   Procedure: LAPAROSCOPY DIAGNOSTIC;  Surgeon: Levi AlandMark E Anderson, MD;  Location: [facility];  Service: Gynecology;  Laterality: N/A;    Family History  Problem Relation Age of Onset  . Alcohol abuse Father   . Depression Father   . Mental illness Father   . Drug abuse Father   . Depression Mother   . Birth defects Sister   . Depression Maternal Grandmother   . Heart disease Maternal Grandmother   . Hypertension Maternal Grandmother   . Depression Maternal Grandfather   . Heart disease Maternal Grandfather   . Hypertension Maternal Grandfather   . Depression Paternal Grandmother   . Heart disease Paternal Grandmother   . Hypertension Paternal Grandmother   . Depression Paternal Grandfather   . Heart disease Paternal Grandfather   . Cancer Paternal Grandfather   . Alcohol abuse Paternal Grandfather   . Hypertension Paternal Grandfather    Social History:  reports that she has never smoked. She has never used smokeless tobacco. She reports that she drinks alcohol. She reports that she does not use drugs.  Allergies:  Allergies  Allergen Reactions  . Phenergan [Promethazine Hcl] Other (See Comments)    Reaction:  Shaking     Medications Prior to Admission  Medication Sig Dispense Refill  . acetaminophen (TYLENOL) 325 MG tablet Take 2 tablets (650 mg total) by mouth every 6 (six) hours as needed for mild pain.    Marland Kitchen. alum & mag hydroxide-simeth  (MAALOX/MYLANTA) 200-200-20 MG/5ML suspension Take 30 mLs by mouth every 4 (four) hours as needed for indigestion. 355 mL 0  . hydrOXYzine (ATARAX/VISTARIL) 25 MG tablet Take 1 tablet (25 mg total) by mouth every 6 (six) hours as needed (anxiety / agitateion). 30 tablet 0  . ibuprofen (ADVIL,MOTRIN) 600 MG tablet Take 1 tablet (600 mg total) by mouth every 6 (six) hours as needed for fever, headache or moderate pain. 30 tablet 0  . lamoTRIgine (LAMICTAL) 25 MG tablet Take 1 tablet (25 mg total) by mouth 2 (two) times daily. For mood stabilization    . liothyronine (CYTOMEL) 25 MCG tablet Take 1 tablet (25 mcg total) by mouth daily. For Hypothyroidism.    Marland Kitchen. LORazepam (ATIVAN) 0.5 MG tablet Take 1 tablet (0.5 mg total) by mouth every 6 (six) hours as needed for anxiety. 30 tablet 0  . magnesium hydroxide (MILK OF MAGNESIA) 400 MG/5ML suspension Take 30 mLs by mouth daily as needed for mild constipation. 360 mL 0  . Multiple Vitamins-Minerals (MULTIVITAMIN GUMMIES ADULT) CHEW Chew 1 each by mouth 2 (two) times daily. Vitamin replacement    . QUEtiapine (SEROQUEL) 400 MG tablet Take 1 tablet (400 mg total) by mouth at bedtime. For mood control    . thiamine 100 MG tablet Take 1 tablet (100 mg total) by mouth daily. For low thiamine    . topiramate (TOPAMAX) 50 MG tablet Take 1 tablet (50 mg total) by  mouth 2 (two) times daily. For mood stabilization    . venlafaxine XR (EFFEXOR-XR) 37.5 MG 24 hr capsule Take 3 capsules (112.5 mg total) by mouth daily with breakfast. For depression      Results for orders placed or performed during the hospital encounter of 06/26/16 (from the past 48 hour(s))  Glucose, capillary     Status: None   Collection Time: 07/01/16  6:48 AM  Result Value Ref Range   Glucose-Capillary 83 65 - 99 mg/dL   No results found.  Review of Systems  Constitutional: Negative.   HENT: Negative.   Eyes: Negative.   Respiratory: Negative.   Cardiovascular: Negative.    Gastrointestinal: Negative.   Musculoskeletal: Negative.   Skin: Negative.   Neurological: Negative.   Psychiatric/Behavioral: Positive for depression. Negative for hallucinations, memory loss, substance abuse and suicidal ideas. The patient has insomnia. The patient is not nervous/anxious.     Blood pressure 130/70, pulse 85, temperature 98.1 F (36.7 C), temperature source Oral, resp. rate 17, height 5\' 4"  (1.626 m), weight 69.4 kg (153 lb), last menstrual period 06/12/2016, SpO2 99 %, unknown if currently breastfeeding. Physical Exam  Nursing note and vitals reviewed. Constitutional: She appears well-developed and well-nourished.  HENT:  Head: Normocephalic and atraumatic.  Eyes: Conjunctivae are normal. Pupils are equal, round, and reactive to light.  Neck: Normal range of motion.  Cardiovascular: Regular rhythm and normal heart sounds.   Respiratory: Effort normal. No respiratory distress.  GI: Soft.  Musculoskeletal: Normal range of motion.  Neurological: She is alert.  Skin: Skin is warm and dry.  Psychiatric: Judgment normal. Her affect is blunt. Her speech is delayed. She is slowed. Cognition and memory are normal. She exhibits a depressed mood. She expresses no suicidal ideation.     Assessment/Plan Continue index ECT course and inpatient treatment for now monitoring for improvement.  Mordecai Rasmussen, MD 07/01/2016, 11:47 AM

## 2016-07-01 NOTE — Anesthesia Post-op Follow-up Note (Cosign Needed)
Anesthesia QCDR form completed.        

## 2016-07-01 NOTE — Transfer of Care (Signed)
Immediate Anesthesia Transfer of Care Note  Patient: Tina DubinKaitlyn L Penix  Procedure(s) Performed: ECT  Patient Location: PACU  Anesthesia Type:General  Level of Consciousness: sedated  Airway & Oxygen Therapy: Patient Spontanous Breathing and Patient connected to face mask oxygen  Post-op Assessment: Report given to RN and Post -op Vital signs reviewed and stable  Post vital signs: Reviewed and stable  Last Vitals:  Vitals:   07/01/16 0952 07/01/16 1205  BP: 130/70 125/74  Pulse: 85 89  Resp: 17 13  Temp: 36.7 C 36.7 C    Last Pain:  Vitals:   07/01/16 0952  TempSrc: Oral  PainSc:       Patients Stated Pain Goal: 0 (06/29/16 2123)  Complications: No apparent anesthesia complications

## 2016-07-01 NOTE — Procedures (Signed)
ECT SERVICES Physician's Interval Evaluation & Treatment Note  Patient Identification: [name] MRN:  [id number] Date of Evaluation:  07/01/2016 TX #: 2  MADRS:   MMSE:   P.E. Findings:  No change to physical exam. Vitals unremarkable  Psychiatric Interval Note:  Mood continues to be depressed flat withdrawn  Subjective:  Patient is a 26 y.o. female seen for evaluation for Electroconvulsive Therapy. Follow-up 3 times a week treatment next treatment on Friday after today  Treatment Summary:   [x]   Right Unilateral             []  Bilateral   % Energy : 0.3 ms 45%   Impedance: 850 ohms  Seizure Energy Index: 16,700 V squared  Postictal Suppression Index: 88%  Seizure Concordance Index: 97%  Medications  Pre Shock: Toradol 30 mg Brevital 70 mg succinylcholine 80 mg  Post Shock:    Seizure Duration: 15 seconds by EMG 56 seconds by EEG   Comments: Follow-up Friday   Lungs:  [x]   Clear to auscultation               []  Other:   Heart:    [x]   Regular rhythm             []  irregular rhythm    [x]   Previous H&P reviewed, patient examined and there are NO CHANGES                 []   Previous H&P reviewed, patient examined and there are changes noted.   Mordecai RasmussenJohn Clapacs, MD 2/21/201811:49 AM

## 2016-07-01 NOTE — Anesthesia Procedure Notes (Signed)
Date/Time: 07/01/2016 11:57 AM Performed by: Lily KocherPERALTA, Nhia Heaphy Pre-anesthesia Checklist: Patient identified, Emergency Drugs available, Suction available and Patient being monitored Patient Re-evaluated:Patient Re-evaluated prior to inductionOxygen Delivery Method: Circle system utilized Preoxygenation: Pre-oxygenation with 100% oxygen Intubation Type: IV induction Ventilation: Mask ventilation without difficulty and Mask ventilation throughout procedure Airway Equipment and Method: Bite block Placement Confirmation: positive ETCO2 Dental Injury: Teeth and Oropharynx as per pre-operative assessment

## 2016-07-01 NOTE — Progress Notes (Signed)
[facility] MD Progress Note  07/01/2016 3:46 PM [name]  MRN:  [id number] Subjective:  Follow-up for 26 year old woman with severe depression currently in the hospital for treatment including ECT  Patient seen for follow-up and for treatment team on Wednesday the 21st. 2 ECT treatments have been completed and she has tolerated them both without much difficulty. Patient continues to feel depressed. Suicidal ideation is present although not as intense perhaps as previously. Affect remains flat and withdrawn. She has no new complaints. No specific new physical complaints. She has attended a few groups.  Principal Problem: Bipolar depression (HCC) Diagnosis:   Patient Active Problem List   Diagnosis Date Noted  . Bipolar depression (HCC) [F31.30] 06/26/2016  . Bipolar I disorder, most recent episode depressed (HCC) [F31.30]   . Major depressive disorder, recurrent severe without psychotic features (HCC) [F33.2] 06/15/2016  . Major depressive disorder, single episode, severe without psychosis (HCC) [F32.2] 06/15/2016  . Normal labor and delivery [O80] 11/23/2010   Total Time spent with patient: 30 minutes  Past Psychiatric History: Patient has a history of major depression recurrent and also probably psychotic manic episodes at some point with a diagnosis of bipolar depression.  Past Medical History:  Past Medical History:  Diagnosis Date  . Anxiety   . Bipolar 1 disorder (HCC)   . Costochondritis   . Depression   . History of kidney stones   . PONV (postoperative nausea and vomiting)     Past Surgical History:  Procedure Laterality Date  . APPENDECTOMY    . CHOLECYSTECTOMY    . LAPAROSCOPY N/A 04/16/2015   Procedure: LAPAROSCOPY DIAGNOSTIC;  Surgeon: Levi Aland, MD;  Location: [facility];  Service: Gynecology;  Laterality: N/A;   Family History:  Family History  Problem Relation Age of Onset  . Alcohol abuse Father   . Depression Father   . Mental illness Father   . Drug  abuse Father   . Depression Mother   . Birth defects Sister   . Depression Maternal Grandmother   . Heart disease Maternal Grandmother   . Hypertension Maternal Grandmother   . Depression Maternal Grandfather   . Heart disease Maternal Grandfather   . Hypertension Maternal Grandfather   . Depression Paternal Grandmother   . Heart disease Paternal Grandmother   . Hypertension Paternal Grandmother   . Depression Paternal Grandfather   . Heart disease Paternal Grandfather   . Cancer Paternal Grandfather   . Alcohol abuse Paternal Grandfather   . Hypertension Paternal Grandfather    Family Psychiatric  History: Depression Social History:  History  Alcohol Use  . Yes     History  Drug Use No    Social History   Social History  . Marital status: Single    Spouse name: N/A  . Number of children: N/A  . Years of education: N/A   Social History Main Topics  . Smoking status: Never Smoker  . Smokeless tobacco: Never Used  . Alcohol use Yes  . Drug use: No  . Sexual activity: Yes    Birth control/ protection: None   Other Topics Concern  . None   Social History Narrative  . None   Additional Social History:                         Sleep: Fair  Appetite:  Fair  Current Medications: Current Facility-Administered Medications  Medication Dose Route Frequency Provider Last Rate Last Dose  . acetaminophen (  TYLENOL) tablet 650 mg  650 mg Oral Q6H PRN Audery Amel, MD   650 mg at 07/01/16 1251  . alum & mag hydroxide-simeth (MAALOX/MYLANTA) 200-200-20 MG/5ML suspension 30 mL  30 mL Oral Q4H PRN Audery Amel, MD      . hydrOXYzine (ATARAX/VISTARIL) tablet 25 mg  25 mg Oral TID PRN Audery Amel, MD   25 mg at 06/28/16 2141  . ibuprofen (ADVIL,MOTRIN) tablet 600 mg  600 mg Oral Q6H PRN Audery Amel, MD   600 mg at 06/29/16 2123  . liothyronine (CYTOMEL) tablet 25 mcg  25 mcg Oral Daily Audery Amel, MD   25 mcg at 07/01/16 1250  . magnesium hydroxide  (MILK OF MAGNESIA) suspension 30 mL  30 mL Oral Daily PRN Audery Amel, MD      . multivitamin-lutein (OCUVITE-LUTEIN) capsule 1 capsule  1 capsule Oral Daily Audery Amel, MD   1 capsule at 07/01/16 1250  . ondansetron (ZOFRAN) tablet 4 mg  4 mg Oral Q6H PRN Audery Amel, MD   4 mg at 06/29/16 1328  . promethazine (PHENERGAN) tablet 12.5 mg  12.5 mg Oral Once Audery Amel, MD      . QUEtiapine (SEROQUEL) tablet 400 mg  400 mg Oral QHS Audery Amel, MD   400 mg at 06/30/16 2123  . topiramate (TOPAMAX) tablet 50 mg  50 mg Oral BID Audery Amel, MD   50 mg at 07/01/16 1250  . traZODone (DESYREL) tablet 100 mg  100 mg Oral QHS PRN Audery Amel, MD      . venlafaxine XR (EFFEXOR-XR) 24 hr capsule 150 mg  150 mg Oral Q breakfast Audery Amel, MD   150 mg at 07/01/16 1250    Lab Results:  Results for orders placed or performed during the hospital encounter of 06/26/16 (from the past 48 hour(s))  Glucose, capillary     Status: None   Collection Time: 07/01/16  6:48 AM  Result Value Ref Range   Glucose-Capillary 83 65 - 99 mg/dL    Blood Alcohol level:  Lab Results  Component Value Date   ETH <5 06/14/2016    Metabolic Disorder Labs: Lab Results  Component Value Date   HGBA1C 4.7 (L) 06/17/2016   MPG 88 06/17/2016   Lab Results  Component Value Date   PROLACTIN 60.7 (H) 06/17/2016   Lab Results  Component Value Date   CHOL 118 06/17/2016   TRIG 100 06/17/2016   HDL 33 (L) 06/17/2016   CHOLHDL 3.6 06/17/2016   VLDL 20 06/17/2016   LDLCALC 65 06/17/2016    Physical Findings: AIMS: Facial and Oral Movements Muscles of Facial Expression: None, normal Lips and Perioral Area: None, normal Jaw: None, normal Tongue: None, normal,Extremity Movements Upper (arms, wrists, hands, fingers): None, normal Lower (legs, knees, ankles, toes): None, normal, Trunk Movements Neck, shoulders, hips: None, normal, Overall Severity Severity of abnormal movements (highest score  from questions above): None, normal Incapacitation due to abnormal movements: None, normal Patient's awareness of abnormal movements (rate only patient's report): No Awareness, Dental Status Current problems with teeth and/or dentures?: No Does patient usually wear dentures?: No  CIWA:    COWS:     Musculoskeletal: Strength & Muscle Tone: within normal limits Gait & Station: normal Patient leans: N/A  Psychiatric Specialty Exam: Physical Exam  Nursing note and vitals reviewed. Constitutional: She appears well-developed and well-nourished.  HENT:  Head: Normocephalic and atraumatic.  Eyes:  Conjunctivae are normal. Pupils are equal, round, and reactive to light.  Neck: Normal range of motion.  Cardiovascular: Regular rhythm and normal heart sounds.   Respiratory: Effort normal. No respiratory distress.  GI: Soft.  Musculoskeletal: Normal range of motion.  Neurological: She is alert.  Skin: Skin is warm and dry.  Psychiatric: Judgment normal. Her speech is delayed. She is slowed. Cognition and memory are normal. She exhibits a depressed mood. She expresses suicidal ideation. She expresses no suicidal plans.    Review of Systems  Constitutional: Negative.   HENT: Negative.   Eyes: Negative.   Respiratory: Negative.   Cardiovascular: Negative.   Gastrointestinal: Negative.   Musculoskeletal: Negative.   Skin: Negative.   Psychiatric/Behavioral: Positive for depression and suicidal ideas. Negative for hallucinations, memory loss and substance abuse. The patient is not nervous/anxious and does not have insomnia.     Blood pressure 109/62, pulse (!) 116, temperature 98.3 F (36.8 C), resp. rate 20, height 5\' 4"  (1.626 m), weight 69.4 kg (153 lb), last menstrual period 06/12/2016, SpO2 98 %, unknown if currently breastfeeding.Body mass index is 26.26 kg/m.  General Appearance: Casual  Eye Contact:  Minimal  Speech:  Slow  Volume:  Decreased  Mood:  Depressed  Affect:  Blunt   Thought Process:  Goal Directed  Orientation:  Full (Time, Place, and Person)  Thought Content:  Logical  Suicidal Thoughts:  Yes.  without intent/plan  Homicidal Thoughts:  No  Memory:  Immediate;   Good Recent;   Fair Remote;   Fair  Judgement:  Good  Insight:  Good  Psychomotor Activity:  Decreased  Concentration:  Concentration: Fair  Recall:  [employer]Fair  Fund of Knowledge:  Fair  Language:  Fair  Akathisia:  No  Handed:  Right  AIMS (if indicated):     Assets:  Desire for Improvement Housing Physical Health  ADL's:  Intact  Cognition:  WNL  Sleep:  Number of Hours: 7.45     Treatment Plan Summary: Daily contact with patient to assess and evaluate symptoms and progress in treatment, Medication management and Plan Reviewed treatment plan with patient and full treatment team. Continue ECT. No specific change indicated) now to medication. Encourage patient to attend groups regularly. Patient herself has no questions right now. We reviewed the fact that she has minimal to no transportation or support outside the hospital making it unlikely that she could complete treatment while an outpatient. Dated length of stay probably still 7-10 days if not possibly more.  Mordecai RasmussenJohn Clapacs, MD 07/01/2016, 3:46 PM

## 2016-07-01 NOTE — H&P (Signed)
Psychiatric Admission Assessment Adult  Patient Identification: [name] MRN:  [id number] Date of Evaluation:  07/01/2016 Chief Complaint:  Depression Principal Diagnosis: Bipolar depression (HCC) Diagnosis:   Patient Active Problem List   Diagnosis Date Noted  . Bipolar depression (HCC) [F31.30] 06/26/2016  . Bipolar I disorder, most recent episode depressed (HCC) [F31.30]   . Major depressive disorder, recurrent severe without psychotic features (HCC) [F33.2] 06/15/2016  . Major depressive disorder, single episode, severe without psychosis (HCC) [F32.2] 06/15/2016  . Normal labor and delivery [O80] 11/23/2010   History of Present Illness: This is a history and physical admission note being done several days after the actual admission in order to have the correct format in the treatment team. Nothing changed from the original H&P. 26 year old woman with a history of bipolar disorder currently in a severe major depression who was transferred [facility] [place] from [facility] in order to try ECT treatment. Associated Signs/Symptoms: Depression Symptoms:  depressed mood, anhedonia, psychomotor retardation, difficulty concentrating, hopelessness, impaired memory, suicidal thoughts without plan, (Hypo) Manic Symptoms:  Hallucinations, Anxiety Symptoms:  Excessive Worry, Psychotic Symptoms:  Hallucinations: Auditory PTSD Symptoms: Had a traumatic exposure:  History of childhood trauma Total Time spent with patient: 1 hour  Past Psychiatric History: Past history of chronic depression as well as probable mania and PTSD multiple medication trials in the past. Recently at behavioral health Hospital and transferred to [place] for further consideration of ECT.  Is the patient at risk to self? Yes.    Has the patient been a risk to self in the past 6 months? Yes.    Has the patient been a risk to self within the distant past? Yes.    Is the patient a risk to others? No.  Has the  patient been a risk to others in the past 6 months? No.  Has the patient been a risk to others within the distant past? No.   Prior Inpatient Therapy:   Prior Outpatient Therapy:    Alcohol Screening: 1. How often do you have a drink containing alcohol?: Monthly or less 2. How many drinks containing alcohol do you have on a typical day when you are drinking?: 5 or 6 3. How often do you have six or more drinks on one occasion?: Less than monthly Preliminary Score: 3 4. How often during the last year have you found that you were not able to stop drinking once you had started?: Never 5. How often during the last year have you failed to do what was normally expected from you becasue of drinking?: Never 6. How often during the last year have you needed a first drink in the morning to get yourself going after a heavy drinking session?: Never 7. How often during the last year have you had a feeling of guilt of remorse after drinking?: Never 8. How often during the last year have you been unable to remember what happened the night before because you had been drinking?: Never 9. Have you or someone else been injured as a result of your drinking?: No 10. Has a relative or friend or a doctor or another health worker been concerned about your drinking or suggested you cut down?: No Alcohol Use Disorder Identification Test Final Score (AUDIT): 4 Brief Intervention: AUDIT score less than 7 or less-screening does not suggest unhealthy drinking-brief intervention not indicated Substance Abuse History in the last 12 months:  No. Consequences of Substance Abuse: Negative Previous Psychotropic Medications: Yes  Psychological Evaluations: Yes  Past Medical History:  Past Medical History:  Diagnosis Date  . Anxiety   . Bipolar 1 disorder (HCC)   . Costochondritis   . Depression   . History of kidney stones   . PONV (postoperative nausea and vomiting)     Past Surgical History:  Procedure Laterality  Date  . APPENDECTOMY    . CHOLECYSTECTOMY    . LAPAROSCOPY N/A 04/16/2015   Procedure: LAPAROSCOPY DIAGNOSTIC;  Surgeon: Levi Aland, MD;  Location: [facility];  Service: Gynecology;  Laterality: N/A;   Family History:  Family History  Problem Relation Age of Onset  . Alcohol abuse Father   . Depression Father   . Mental illness Father   . Drug abuse Father   . Depression Mother   . Birth defects Sister   . Depression Maternal Grandmother   . Heart disease Maternal Grandmother   . Hypertension Maternal Grandmother   . Depression Maternal Grandfather   . Heart disease Maternal Grandfather   . Hypertension Maternal Grandfather   . Depression Paternal Grandmother   . Heart disease Paternal Grandmother   . Hypertension Paternal Grandmother   . Depression Paternal Grandfather   . Heart disease Paternal Grandfather   . Cancer Paternal Grandfather   . Alcohol abuse Paternal Grandfather   . Hypertension Paternal Grandfather    Family Psychiatric  History: Nonidentified Tobacco Screening:  does not smoke Social History:  History  Alcohol Use  . Yes     History  Drug Use No    Additional Social History:                           Allergies:   Allergies  Allergen Reactions  . Phenergan [Promethazine Hcl] Other (See Comments)    Reaction:  Shaking    Lab Results:  Results for orders placed or performed during the hospital encounter of 06/26/16 (from the past 48 hour(s))  Glucose, capillary     Status: None   Collection Time: 07/01/16  6:48 AM  Result Value Ref Range   Glucose-Capillary 83 65 - 99 mg/dL    Blood Alcohol level:  Lab Results  Component Value Date   ETH <5 06/14/2016    Metabolic Disorder Labs:  Lab Results  Component Value Date   HGBA1C 4.7 (L) 06/17/2016   MPG 88 06/17/2016   Lab Results  Component Value Date   PROLACTIN 60.7 (H) 06/17/2016   Lab Results  Component Value Date   CHOL 118 06/17/2016   TRIG 100 06/17/2016   HDL 33  (L) 06/17/2016   CHOLHDL 3.6 06/17/2016   VLDL 20 06/17/2016   LDLCALC 65 06/17/2016    Current Medications: Current Facility-Administered Medications  Medication Dose Route Frequency Provider Last Rate Last Dose  . acetaminophen (TYLENOL) tablet 650 mg  650 mg Oral Q6H PRN Audery Amel, MD   650 mg at 07/01/16 1251  . alum & mag hydroxide-simeth (MAALOX/MYLANTA) 200-200-20 MG/5ML suspension 30 mL  30 mL Oral Q4H PRN Audery Amel, MD      . hydrOXYzine (ATARAX/VISTARIL) tablet 25 mg  25 mg Oral TID PRN Audery Amel, MD   25 mg at 06/28/16 2141  . ibuprofen (ADVIL,MOTRIN) tablet 600 mg  600 mg Oral Q6H PRN Audery Amel, MD   600 mg at 06/29/16 2123  . liothyronine (CYTOMEL) tablet 25 mcg  25 mcg Oral Daily Audery Amel, MD   25 mcg at 07/01/16 1250  .  magnesium hydroxide (MILK OF MAGNESIA) suspension 30 mL  30 mL Oral Daily PRN Audery AmelJohn T Mathayus Stanbery, MD      . multivitamin-lutein (OCUVITE-LUTEIN) capsule 1 capsule  1 capsule Oral Daily Audery AmelJohn T Marily Konczal, MD   1 capsule at 07/01/16 1250  . ondansetron (ZOFRAN) tablet 4 mg  4 mg Oral Q6H PRN Audery AmelJohn T Shawniece Oyola, MD   4 mg at 06/29/16 1328  . promethazine (PHENERGAN) tablet 12.5 mg  12.5 mg Oral Once Audery AmelJohn T Domique Reardon, MD      . QUEtiapine (SEROQUEL) tablet 400 mg  400 mg Oral QHS Audery AmelJohn T Husain Costabile, MD   400 mg at 06/30/16 2123  . topiramate (TOPAMAX) tablet 50 mg  50 mg Oral BID Audery AmelJohn T Abir Craine, MD   50 mg at 07/01/16 1250  . traZODone (DESYREL) tablet 100 mg  100 mg Oral QHS PRN Audery AmelJohn T Lafawn Lenoir, MD      . venlafaxine XR (EFFEXOR-XR) 24 hr capsule 150 mg  150 mg Oral Q breakfast Audery AmelJohn T Nitzia Perren, MD   150 mg at 07/01/16 1250   PTA Medications: Prescriptions Prior to Admission  Medication Sig Dispense Refill Last Dose  . acetaminophen (TYLENOL) 325 MG tablet Take 2 tablets (650 mg total) by mouth every 6 (six) hours as needed for mild pain.     Marland Kitchen. alum & mag hydroxide-simeth (MAALOX/MYLANTA) 200-200-20 MG/5ML suspension Take 30 mLs by mouth every 4 (four)  hours as needed for indigestion. 355 mL 0   . hydrOXYzine (ATARAX/VISTARIL) 25 MG tablet Take 1 tablet (25 mg total) by mouth every 6 (six) hours as needed (anxiety / agitateion). 30 tablet 0   . ibuprofen (ADVIL,MOTRIN) 600 MG tablet Take 1 tablet (600 mg total) by mouth every 6 (six) hours as needed for fever, headache or moderate pain. 30 tablet 0   . lamoTRIgine (LAMICTAL) 25 MG tablet Take 1 tablet (25 mg total) by mouth 2 (two) times daily. For mood stabilization     . liothyronine (CYTOMEL) 25 MCG tablet Take 1 tablet (25 mcg total) by mouth daily. For Hypothyroidism.     Marland Kitchen. LORazepam (ATIVAN) 0.5 MG tablet Take 1 tablet (0.5 mg total) by mouth every 6 (six) hours as needed for anxiety. 30 tablet 0   . magnesium hydroxide (MILK OF MAGNESIA) 400 MG/5ML suspension Take 30 mLs by mouth daily as needed for mild constipation. 360 mL 0   . Multiple Vitamins-Minerals (MULTIVITAMIN GUMMIES ADULT) CHEW Chew 1 each by mouth 2 (two) times daily. Vitamin replacement     . QUEtiapine (SEROQUEL) 400 MG tablet Take 1 tablet (400 mg total) by mouth at bedtime. For mood control     . thiamine 100 MG tablet Take 1 tablet (100 mg total) by mouth daily. For low thiamine     . topiramate (TOPAMAX) 50 MG tablet Take 1 tablet (50 mg total) by mouth 2 (two) times daily. For mood stabilization     . venlafaxine XR (EFFEXOR-XR) 37.5 MG 24 hr capsule Take 3 capsules (112.5 mg total) by mouth daily with breakfast. For depression       Musculoskeletal: Strength & Muscle Tone: within normal limits Gait & Station: normal Patient leans: N/A  Psychiatric Specialty Exam: Physical Exam  ROS  Blood pressure 109/62, pulse (!) 116, temperature 98.3 F (36.8 C), resp. rate 20, height 5\' 4"  (1.626 m), weight 69.4 kg (153 lb), last menstrual period 06/12/2016, SpO2 98 %, unknown if currently breastfeeding.Body mass index is 26.26 kg/m.  General Appearance: Casual  Eye  Contact:  Fair  Speech:  Slow  Volume:  Decreased   Mood:  Depressed  Affect:  Flat  Thought Process:  Goal Directed  Orientation:  Full (Time, Place, and Person)  Thought Content:  Logical  Suicidal Thoughts:  Yes.  without intent/plan  Homicidal Thoughts:  No  Memory:  Immediate;   Good Recent;   Fair Remote;   Fair  Judgement:  Impaired  Insight:  Shallow  Psychomotor Activity:  Decreased  Concentration:  Concentration: Fair  Recall:  [employer] of Knowledge:  Fair  Language:  Fair  Akathisia:  No  Handed:  Right  AIMS (if indicated):     Assets:  Housing Physical Health Resilience Social Support  ADL's:  Intact  Cognition:  Impaired,  Mild  Sleep:  Number of Hours: 7.45    Treatment Plan Summary: Daily contact with patient to assess and evaluate symptoms and progress in treatment, Medication management and Plan Ongoing medication management for depression as well as initiation of ECT.  Observation Level/Precautions:  15 minute checks  Laboratory:  HCG  Psychotherapy:    Medications:    Consultations:    Discharge Concerns:    Estimated LOS:  Other:     Physician Treatment Plan for Primary Diagnosis: Bipolar depression (HCC) Long Term Goal(s): Improvement in symptoms so as ready for discharge  Short Term Goals: Ability to verbalize feelings will improve, Ability to disclose and discuss suicidal ideas and Ability to demonstrate self-control will improve  Physician Treatment Plan for Secondary Diagnosis: Principal Problem:   Bipolar depression (HCC)  Long Term Goal(s): Improvement in symptoms so as ready for discharge  Short Term Goals: Ability to maintain clinical measurements within normal limits will improve and Compliance with prescribed medications will improve  I certify that inpatient services furnished can reasonably be expected to improve the patient's condition.    Mordecai Rasmussen, MD 2/21/20183:49 PM

## 2016-07-02 NOTE — Plan of Care (Signed)
Problem: [facility] Participation in Recreation Therapeutic Interventions Goal: STG-Patient will demonstrate improved self esteem by identif STG: Self-Esteem - Within 4 treatment sessions, patient will verbalize at least 5 positive affirmation statements in each of 2 treatment sessions to increase self-esteem.  Outcome: Progressing Treatment Session 1; Completed 1 out of 2: At approximately 8:45 am, LRT met with patient in craft room. Patient verbalized 5 positive affirmation statements. Patient reported it felt "weird". LRT encouraged patient to continue saying positive affirmation statements.  Leonette Monarch, LRT/CTRS 02.22.18 3:29 pm Goal: STG-Other Recreation Therapy Goal (Specify) STG: Stress Management - Within 4 treatment sessions, patient will verbalize understanding of the stress management techniques in each of 2 treatment sessions to increase stress management skills.  Outcome: Progressing Treatment Session 1; Completed 1 out of 2: At approximately 8:45 am, LRT met with patient in craft room. LRT educated and provided patient with handouts on stress management techniques. Patient verbalized understanding. LRT encouraged patient to read over and practice the stress management techniques.  Leonette Monarch, LRT/CTRS 02.22.18 3:31 pm

## 2016-07-02 NOTE — [facility] Group Notes (Signed)
[facility] LCSW Group Therapy  07/02/2016 11:20 AM  Type of Therapy:  Group Therapy  Participation Level:  Active  Participation Quality:  Attentive and Sharing  Affect:  Appropriate  Cognitive:  Alert  Insight:  Developing/Improving  Engagement in Therapy:  Developing/Improving  Modes of Intervention:  Activity, Discussion, Education, Problem-solving, Reality Testing and Support  Summary of Progress/Problems: Balance in life: Patients will discuss the concept of balance and how it looks and feels to be unbalanced. Pt will identify areas in their life that is unbalanced and ways to become more balanced. They discussed what aspects in their lives has influenced their self care. Patients also discussed self care in the areas of self regulation/control, hygiene/appearance, sleep/relaxation, healthy leisure, healthy eating habits, exercise, inner peace/spirituality, self improvement, sobriety, and health management. They were challenged to identify changes that are needed in order to improve self care. Patient states she needs to work with an outpatient therapist to work on past trauma from her childhood and father. Patient states finances have been a barrier for her in the past but states she has support from her grandmother to continue outpatient therapy when she returns home.   Melika Reder G. Garnette CzechSampson MSW, LCSWA 07/02/2016, 11:22 AM

## 2016-07-02 NOTE — Progress Notes (Signed)
[facility] MD Progress Note  07/02/2016 5:10 PM [name]  MRN:  [id number] Subjective:  Follow-up for 26 year old woman with severe depression currently in the hospital for treatment including ECT  Follow-up for Thursday the 22nd. Patient seen this afternoon. She is awake and alert and appropriate in her interaction. She tells me that she thinks she is feeling a little bit better. She also wants to discuss discharge planning. She wants to talk about how much she misses her children whom she has not seen in 3 weeks. Denies having any current suicidal ideation. While operative with treatment and no other new complaints.  Principal Problem: Bipolar depression (HCC) Diagnosis:   Patient Active Problem List   Diagnosis Date Noted  . Bipolar depression (HCC) [F31.30] 06/26/2016  . Bipolar I disorder, most recent episode depressed (HCC) [F31.30]   . Major depressive disorder, recurrent severe without psychotic features (HCC) [F33.2] 06/15/2016  . Major depressive disorder, single episode, severe without psychosis (HCC) [F32.2] 06/15/2016  . Normal labor and delivery [O80] 11/23/2010   Total Time spent with patient: 30 minutes  Past Psychiatric History: Patient has a history of major depression recurrent and also probably psychotic manic episodes at some point with a diagnosis of bipolar depression.  Past Medical History:  Past Medical History:  Diagnosis Date  . Anxiety   . Bipolar 1 disorder (HCC)   . Costochondritis   . Depression   . History of kidney stones   . PONV (postoperative nausea and vomiting)     Past Surgical History:  Procedure Laterality Date  . APPENDECTOMY    . CHOLECYSTECTOMY    . LAPAROSCOPY N/A 04/16/2015   Procedure: LAPAROSCOPY DIAGNOSTIC;  Surgeon: Levi Aland, MD;  Location: [facility];  Service: Gynecology;  Laterality: N/A;   Family History:  Family History  Problem Relation Age of Onset  . Alcohol abuse Father   . Depression Father   . Mental illness  Father   . Drug abuse Father   . Depression Mother   . Birth defects Sister   . Depression Maternal Grandmother   . Heart disease Maternal Grandmother   . Hypertension Maternal Grandmother   . Depression Maternal Grandfather   . Heart disease Maternal Grandfather   . Hypertension Maternal Grandfather   . Depression Paternal Grandmother   . Heart disease Paternal Grandmother   . Hypertension Paternal Grandmother   . Depression Paternal Grandfather   . Heart disease Paternal Grandfather   . Cancer Paternal Grandfather   . Alcohol abuse Paternal Grandfather   . Hypertension Paternal Grandfather    Family Psychiatric  History: Depression Social History:  History  Alcohol Use  . Yes     History  Drug Use No    Social History   Social History  . Marital status: Single    Spouse name: N/A  . Number of children: N/A  . Years of education: N/A   Social History Main Topics  . Smoking status: Never Smoker  . Smokeless tobacco: Never Used  . Alcohol use Yes  . Drug use: No  . Sexual activity: Yes    Birth control/ protection: None   Other Topics Concern  . None   Social History Narrative  . None   Additional Social History:                         Sleep: Fair  Appetite:  Fair  Current Medications: Current Facility-Administered Medications  Medication Dose Route  Frequency Provider Last Rate Last Dose  . acetaminophen (TYLENOL) tablet 650 mg  650 mg Oral Q6H PRN Audery AmelJohn T Tangela Dolliver, MD   650 mg at 07/01/16 1251  . alum & mag hydroxide-simeth (MAALOX/MYLANTA) 200-200-20 MG/5ML suspension 30 mL  30 mL Oral Q4H PRN Audery AmelJohn T Aliesha Dolata, MD      . hydrOXYzine (ATARAX/VISTARIL) tablet 25 mg  25 mg Oral TID PRN Audery AmelJohn T Janyia Guion, MD   25 mg at 07/01/16 2111  . ibuprofen (ADVIL,MOTRIN) tablet 600 mg  600 mg Oral Q6H PRN Audery AmelJohn T Jenay Morici, MD   600 mg at 06/29/16 2123  . liothyronine (CYTOMEL) tablet 25 mcg  25 mcg Oral Daily Audery AmelJohn T Clara Herbison, MD   25 mcg at 07/02/16 [id number]  .  magnesium hydroxide (MILK OF MAGNESIA) suspension 30 mL  30 mL Oral Daily PRN Audery AmelJohn T Rokhaya Quinn, MD      . multivitamin-lutein (OCUVITE-LUTEIN) capsule 1 capsule  1 capsule Oral Daily Audery AmelJohn T Jmarion Christiano, MD   1 capsule at 07/02/16 [id number]  . ondansetron (ZOFRAN) tablet 4 mg  4 mg Oral Q6H PRN Audery AmelJohn T Marteze Vecchio, MD   4 mg at 06/29/16 1328  . promethazine (PHENERGAN) tablet 12.5 mg  12.5 mg Oral Once Audery AmelJohn T Zaylynn Rickett, MD      . QUEtiapine (SEROQUEL) tablet 400 mg  400 mg Oral QHS Audery AmelJohn T Robie Oats, MD   400 mg at 07/01/16 2110  . topiramate (TOPAMAX) tablet 50 mg  50 mg Oral BID Audery AmelJohn T Virtie Bungert, MD   50 mg at 07/02/16 1640  . traZODone (DESYREL) tablet 100 mg  100 mg Oral QHS PRN Audery AmelJohn T Hassani Sliney, MD      . venlafaxine XR (EFFEXOR-XR) 24 hr capsule 150 mg  150 mg Oral Q breakfast Audery AmelJohn T Yeslin Delio, MD   150 mg at 07/02/16 [id number]    Lab Results:  Results for orders placed or performed during the hospital encounter of 06/26/16 (from the past 48 hour(s))  Glucose, capillary     Status: None   Collection Time: 07/01/16  6:48 AM  Result Value Ref Range   Glucose-Capillary 83 65 - 99 mg/dL    Blood Alcohol level:  Lab Results  Component Value Date   ETH <5 06/14/2016    Metabolic Disorder Labs: Lab Results  Component Value Date   HGBA1C 4.7 (L) 06/17/2016   MPG 88 06/17/2016   Lab Results  Component Value Date   PROLACTIN 60.7 (H) 06/17/2016   Lab Results  Component Value Date   CHOL 118 06/17/2016   TRIG 100 06/17/2016   HDL 33 (L) 06/17/2016   CHOLHDL 3.6 06/17/2016   VLDL 20 06/17/2016   LDLCALC 65 06/17/2016    Physical Findings: AIMS: Facial and Oral Movements Muscles of Facial Expression: None, normal Lips and Perioral Area: None, normal Jaw: None, normal Tongue: None, normal,Extremity Movements Upper (arms, wrists, hands, fingers): None, normal Lower (legs, knees, ankles, toes): None, normal, Trunk Movements Neck, shoulders, hips: None, normal, Overall Severity Severity of abnormal  movements (highest score from questions above): None, normal Incapacitation due to abnormal movements: None, normal Patient's awareness of abnormal movements (rate only patient's report): No Awareness, Dental Status Current problems with teeth and/or dentures?: No Does patient usually wear dentures?: No  CIWA:    COWS:     Musculoskeletal: Strength & Muscle Tone: within normal limits Gait & Station: normal Patient leans: N/A  Psychiatric Specialty Exam: Physical Exam  Nursing note and vitals reviewed. Constitutional: She appears well-developed and well-nourished.  HENT:  Head: Normocephalic and atraumatic.  Eyes: Conjunctivae are normal. Pupils are equal, round, and reactive to light.  Neck: Normal range of motion.  Cardiovascular: Regular rhythm and normal heart sounds.   Respiratory: Effort normal. No respiratory distress.  GI: Soft.  Musculoskeletal: Normal range of motion.  Neurological: She is alert.  Skin: Skin is warm and dry.  Psychiatric: Judgment normal. Her speech is delayed. She is slowed. Cognition and memory are normal. She does not exhibit a depressed mood. She expresses no suicidal ideation. She expresses no suicidal plans.    Review of Systems  Constitutional: Negative.   HENT: Negative.   Eyes: Negative.   Respiratory: Negative.   Cardiovascular: Negative.   Gastrointestinal: Negative.   Musculoskeletal: Negative.   Skin: Negative.   Psychiatric/Behavioral: Positive for depression. Negative for hallucinations, memory loss, substance abuse and suicidal ideas. The patient is not nervous/anxious and does not have insomnia.     Blood pressure 122/72, pulse (!) 112, temperature 98.6 F (37 C), resp. rate 20, height 5\' 4"  (1.626 m), weight 69.4 kg (153 lb), last menstrual period 06/12/2016, SpO2 98 %, unknown if currently breastfeeding.Body mass index is 26.26 kg/m.  General Appearance: Casual  Eye Contact:  Minimal  Speech:  Slow  Volume:  Decreased  Mood:   Depressed  Affect:  Blunt  Thought Process:  Goal Directed  Orientation:  Full (Time, Place, and Person)  Thought Content:  Logical  Suicidal Thoughts:  Yes.  without intent/plan  Homicidal Thoughts:  No  Memory:  Immediate;   Good Recent;   Fair Remote;   Fair  Judgement:  Good  Insight:  Good  Psychomotor Activity:  Decreased  Concentration:  Concentration: Fair  Recall:  [employer] of Knowledge:  Fair  Language:  Fair  Akathisia:  No  Handed:  Right  AIMS (if indicated):     Assets:  Desire for Improvement Housing Physical Health  ADL's:  Intact  Cognition:  WNL  Sleep:  Number of Hours: 8.15     Treatment Plan Summary: Daily contact with patient to assess and evaluate symptoms and progress in treatment, Medication management and Plan Patient appears to be showing early improvement. Has not had any dangerous behavior on the unit and is showing overall good cooperation. I talked with her about the possibility of outpatient ECT. She has transportation set up in the form of her father now. If this is true I told her I would be happy to discharge her if she is feeling safe with that plan. We will have ECT tomorrow morning and after that if she is still feeling okay we can discuss a discharge plan with continued ECT next week.  Mordecai Rasmussen, MD 07/02/2016, 5:10 PM

## 2016-07-02 NOTE — Progress Notes (Signed)
Affect sad.  Rates depression as 7/10.  Denies SI/HI/AVH.  Up to dayroom, minimal interaction noted with peers.  Medication and group compliant.  Support and encouragement offered.  Safety maintained.

## 2016-07-02 NOTE — Progress Notes (Signed)
D: Pt denies SI/HI/AVH. Pt is pleasant and cooperative, affect is flat but brightens upon approach. Pt stated she feels better from doing ECT, she appears less anxious and she is interacting with peers and staff appropriately.  A: Pt was offered support and encouragement. Pt was given scheduled medications. Pt was encouraged to attend groups. Q 15 minute checks were done for safety.  R:Pt attends groups and interacts well with peers and staff. Pt is taking medication. Pt has no complaints.Pt receptive to treatment and safety maintained on unit.

## 2016-07-02 NOTE — [facility] Group Notes (Signed)
[facility] LCSW Group Therapy Note  Type of Therapy and Topic:  Group Therapy:  Goals Group: SMART Goals  Participation Level:  Patient attended group on this date. Patient participated in goal setting and was able to share openly with the group.   Description of Group:   The purpose of a daily goals group is to assist and guide patients in setting recovery/wellness-related goals.  The objective is to set goals as they relate to the crisis in which they were admitted. Patients will be using SMART goal modalities to set measurable goals.  Characteristics of realistic goals will be discussed and patients will be assisted in setting and processing how one will reach their goal. Facilitator will also assist patients in applying interventions and coping skills learned in psycho-education groups to the SMART goal and process how one will achieve defined goal.  Therapeutic Goals: -Patients will develop and document one goal related to or their crisis in which brought them into treatment. -Patients will be guided by LCSW using SMART goal setting modality in how to set a measurable, attainable, realistic and time sensitive goal.  -Patients will process barriers in reaching goal. -Patients will process interventions in how to overcome and successful in reaching goal.   Summary of Patient Progress:  Patient Goal: "I need to stop over thinking every situation". CSW provided support to patient and discussed ways to manage her anxiety.     Therapeutic Modalities:   Motivational Interviewing Engineer, manufacturing systemsCognitive Behavioral Therapy Crisis Intervention Model SMART goals setting  Felice Hope G. Garnette CzechSampson MSW, [facility] HospitalCSWA 07/02/2016 10:46 AM

## 2016-07-02 NOTE — Progress Notes (Signed)
Recreation Therapy Notes  Date: 02.22.18 Time: 1:00 pm Location: Craft Room  Group Topic: Leisure Education  Goal Area(s) Addresses:  Patient will identify activities for each letter of the alphabet. Patient will verbalize ability to integrate positive leisure into life post d/c. Patient will verbalize ability to use leisure as a Associate Professorcoping skill.  Behavioral Response: Attentive, Interactive  Intervention: Leisure Alphabet  Activity: Patients were given a Leisure Information systems managerAlphabet worksheet and were instructed to write healthy leisure activities for each letter of the alphabet.  Education: LRT educated patients on what they need to participate in leisure.  Education Outcome: Acknowledges education/In group clarification offered  Clinical Observations/Feedback: Patient wrote healthy leisure activities for each letter of the alphabet. Patient contributed to group discussion by stating some of her healthy leisure activities.  [name],Coleton Woon M, LRT/CTRS 07/02/2016 2:04 PM

## 2016-07-03 ENCOUNTER — Inpatient Hospital Stay: Payer: 59 | Admitting: Anesthesiology

## 2016-07-03 ENCOUNTER — Other Ambulatory Visit: Payer: Self-pay | Admitting: Psychiatry

## 2016-07-03 ENCOUNTER — Encounter: Payer: Self-pay | Admitting: *Deleted

## 2016-07-03 LAB — GLUCOSE, CAPILLARY: GLUCOSE-CAPILLARY: 81 mg/dL (ref 65–99)

## 2016-07-03 MED ORDER — LIOTHYRONINE SODIUM 25 MCG PO TABS: 25.0000 ug | ORAL_TABLET | Freq: Every day | ORAL | 0 refills | Status: DC

## 2016-07-03 MED ORDER — KETOROLAC TROMETHAMINE 30 MG/ML IJ SOLN
30.0000 mg | Freq: Once | INTRAMUSCULAR | Status: AC
  Administered 2016-07-03: 30 mg via INTRAVENOUS

## 2016-07-03 MED ORDER — LAMOTRIGINE 25 MG PO TABS: 25.0000 mg | ORAL_TABLET | Freq: Two times a day (BID) | ORAL | 0 refills | Status: DC

## 2016-07-03 MED ORDER — SUCCINYLCHOLINE CHLORIDE 20 MG/ML IJ SOLN
INTRAMUSCULAR | Status: AC
  Filled 2016-07-03: qty 1

## 2016-07-03 MED ORDER — TOPIRAMATE 50 MG PO TABS: 50.0000 mg | ORAL_TABLET | Freq: Two times a day (BID) | ORAL | 0 refills | Status: DC

## 2016-07-03 MED ORDER — SUCCINYLCHOLINE CHLORIDE 200 MG/10ML IV SOSY
PREFILLED_SYRINGE | INTRAVENOUS | Status: DC | PRN
  Administered 2016-07-03: 80 mg via INTRAVENOUS

## 2016-07-03 MED ORDER — KETOROLAC TROMETHAMINE 30 MG/ML IJ SOLN
INTRAMUSCULAR | Status: AC
  Administered 2016-07-03: 30 mg via INTRAVENOUS
  Filled 2016-07-03: qty 1

## 2016-07-03 MED ORDER — VENLAFAXINE HCL ER 150 MG PO CP24: 150.0000 mg | ORAL_CAPSULE | Freq: Every day | ORAL | 0 refills | Status: DC

## 2016-07-03 MED ORDER — TRAZODONE HCL 100 MG PO TABS: 100.0000 mg | ORAL_TABLET | Freq: Every evening | ORAL | 0 refills | Status: DC | PRN

## 2016-07-03 MED ORDER — METHOHEXITAL SODIUM 100 MG/10ML IV SOSY
PREFILLED_SYRINGE | INTRAVENOUS | Status: DC | PRN
  Administered 2016-07-03: 70 mg via INTRAVENOUS

## 2016-07-03 MED ORDER — SODIUM CHLORIDE 0.9 % IV SOLN
500.0000 mL | Freq: Once | INTRAVENOUS | Status: AC
  Administered 2016-07-03: 1000 mL via INTRAVENOUS

## 2016-07-03 MED ORDER — SODIUM CHLORIDE 0.9 % IV SOLN
INTRAVENOUS | Status: DC | PRN
  Administered 2016-07-03: 13:00:00 via INTRAVENOUS

## 2016-07-03 MED ORDER — QUETIAPINE FUMARATE 400 MG PO TABS: 400.0000 mg | ORAL_TABLET | Freq: Every day | ORAL | 0 refills | Status: DC

## 2016-07-03 NOTE — Discharge Instructions (Signed)
1)  The drugs that you have been given will stay in your system until tomorrow so for the       next 24 hours you should not:  A. Drive an automobile  B. Make any legal decisions  C. Drink any alcoholic beverages  2)  You may resume your regular meals upon return home.  3)  A responsible adult must take you home.  Someone should stay with you for a few          hours, then be available by phone for the remainder of the treatment day.  4)  You May experience any of the following symptoms:  Headache, Nausea and a dry mouth (due to the medications you were given),  temporary memory loss and some confusion, or sore muscles (a warm bath  should help this).  If you you experience any of these symptoms let us know on                your return visit.  5)  Report any of the following: any acute discomfort, severe headache, or temperature        greater than 100.5 F.   Also report any unusual redness, swelling, drainage, or pain         at your IV site.    You may report Symptoms to:  ECT PROGRAM- [place] [facility]          Phone: [phone], ECT Department           or Dr. Shary Keylapac's office [phone]  6)  Your next ECT Treatment is Day Monday  Date July 06, 2016 at 900am  We will call 2 days prior to your scheduled appointment for arrival times.  7)  Nothing to eat or drink after midnight the night before your procedure.  8)  Take .  With a sip of water the morning of your procedure.  9)  Other Instructions: Call [phone] to cancel the morning of your procedure due         to illness or emergency.  10) We will call within 72 hours to assess how you are feeling.

## 2016-07-03 NOTE — Progress Notes (Signed)
Recreation Therapy Notes  Date: 02.23.18 Time: 1:00 pm Location: Craft Room  Group Topic: Social Skills  Goal Area(s) Addresses:  Patient will effectively work with peers towards shared goal. Patient will identify skill used to make activity successful. Patient will identify how skills used during activity can be used to reach post d/c. goals.  Behavioral Response: Did not attend  Intervention: Life Boat  Activity: Patients were given a list of 16 people Ship broker([employer], Runner, broadcasting/film/videoteacher, doctor, etc.) and were told they had to pick 8 people to be on a bigger, faster boat with them and 8 people who would be on an inflatable boat.  Education: LRT educated patients on healthy support systems.  Education Outcome: Patient did not attend group.  Clinical Observations/Feedback: Patient was in ECT treatment during group.  [name],Valborg Friar M, LRT/CTRS 07/03/2016 2:06 PM

## 2016-07-03 NOTE — Anesthesia Preprocedure Evaluation (Signed)
Anesthesia Evaluation  Patient identified by MRN, date of birth, ID band Patient awake    Reviewed: Allergy & Precautions, H&P , NPO status , Patient's Chart, lab work & pertinent test results  History of Anesthesia Complications (+) PONV and history of anesthetic complications  Airway Mallampati: II  TM Distance: >3 FB Neck ROM: full    Dental no notable dental hx. (+) Poor Dentition, Chipped   Pulmonary neg pulmonary ROS,    Pulmonary exam normal        Cardiovascular negative cardio ROS Normal cardiovascular exam     Neuro/Psych PSYCHIATRIC DISORDERS negative neurological ROS     GI/Hepatic negative GI ROS, Neg liver ROS,   Endo/Other  negative endocrine ROS  Renal/GU negative Renal ROS     Musculoskeletal   Abdominal Normal abdominal exam  (+)   Peds  Hematology negative hematology ROS (+)   Anesthesia Other Findings Past Medical History: No date: Anxiety No date: Bipolar 1 disorder (HCC) No date: Costochondritis No date: Depression No date: History of kidney stones No date: PONV (postoperative nausea and vomiting)   Reproductive/Obstetrics negative OB ROS                             Anesthesia Physical  Anesthesia Plan  ASA: III  Anesthesia Plan: General   Post-op Pain Management:    Induction: Intravenous  Airway Management Planned: Mask  Additional Equipment:   Intra-op Plan:   Post-operative Plan:   Informed Consent: I have reviewed the patients History and Physical, chart, labs and discussed the procedure including the risks, benefits and alternatives for the proposed anesthesia with the patient or authorized representative who has indicated his/her understanding and acceptance.   Dental Advisory Given  Plan Discussed with: CRNA and Surgeon  Anesthesia Plan Comments:         Anesthesia Quick Evaluation

## 2016-07-03 NOTE — Progress Notes (Signed)
  [facility] :  Will you be returning to the same living situation after discharge:  Yes,  home with children At discharge, do you have transportation home?: Yes,  family Do you have the ability to pay for your medications: Yes,  patient has insurance.  Release of information consent forms completed and in the chart;  Patient's signature needed at discharge.  Patient to Follow up at: Follow-up Information    [facility]. Go on 07/07/2016.   Why:  1:00pm with Cam Hines for your initial assessment on 07/07/2016. Medication management appointment scheduled for 3/21 at 9:15am with Dr. Lenoria FarrierAkers. Contact information: [address] [place] [address] P: [phone] F: [phone]       [facility]. Call.   Why:  Call to reschedule your  appointment that was on 06/30/2016 at 11:00am for your intial intake. Please arrive 20 minutes prior to your scheduled appointment. Contact information: [address] [place], [place] [address] Phone: [phone] Fax: [phone]           Next level of care provider [facility] Link:no  Safety Planning and Suicide Prevention discussed: Yes,  with patient and grandmother  Has patient been referred to the Quitline?: Patient refused referral  Patient has been referred for addiction treatment: Yes.   Jovannie Ulibarri G. Garnette CzechSampson MSW, LCSWA 07/03/2016, 2:23 PM

## 2016-07-03 NOTE — Anesthesia Postprocedure Evaluation (Signed)
Anesthesia Post Note  Patient: Tina DubinKaitlyn L Pohl  Procedure(s) Performed: * No procedures listed *  Patient location during evaluation: PACU Anesthesia Type: General Level of consciousness: awake and alert Pain management: pain level controlled Vital Signs Assessment: post-procedure vital signs reviewed and stable Respiratory status: spontaneous breathing, nonlabored ventilation, respiratory function stable and patient connected to nasal cannula oxygen Cardiovascular status: blood pressure returned to baseline and stable Postop Assessment: no signs of nausea or vomiting Anesthetic complications: no     Last Vitals:  Vitals:   07/03/16 1317 07/03/16 1325  BP: 124/68 114/70  Pulse: (!) 108 (!) 108  Resp: 18 15  Temp:      Last Pain:  Vitals:   07/03/16 1325  TempSrc:   PainSc: 0-No pain                 Cleda MccreedyJoseph K Bijal Siglin

## 2016-07-03 NOTE — BHH Suicide Risk Assessment (Signed)
[facility]   Principal Problem: Bipolar depression [facility](HCC) Discharge Diagnoses:  Patient Active Problem List   Diagnosis Date Noted  . Bipolar depression (HCC) [F31.30] 06/26/2016  . Bipolar I disorder, most recent episode depressed (HCC) [F31.30]   . Major depressive disorder, recurrent severe without psychotic features (HCC) [F33.2] 06/15/2016  . Major depressive disorder, single episode, severe without psychosis (HCC) [F32.2] 06/15/2016  . Normal labor and delivery [O80] 11/23/2010    Total Time spent with patient: 45 minutes  Musculoskeletal: Strength & Muscle Tone: within normal limits Gait & Station: normal Patient leans: N/A  Psychiatric Specialty Exam: Review of Systems  Constitutional: Negative.   HENT: Negative.   Eyes: Negative.   Respiratory: Negative.   Cardiovascular: Negative.   Gastrointestinal: Negative.   Musculoskeletal: Negative.   Skin: Negative.   Neurological: Negative.   Psychiatric/Behavioral: Positive for depression. Negative for hallucinations, memory loss, substance abuse and suicidal ideas. The patient is not nervous/anxious and does not have insomnia.     Blood pressure 116/77, pulse (!) 113, temperature 98.2 F (36.8 C), resp. rate (!) 21, height 5\' 4"  (1.626 m), weight 69.9 kg (154 lb), last menstrual period 06/12/2016, SpO2 98 %, unknown if currently breastfeeding.Body mass index is 26.43 kg/m.  General Appearance: Casual  Eye Contact::  Good  Speech:  Slow409  Volume:  Decreased  Mood:  Dysphoric  Affect:  Congruent  Thought Process:  Goal Directed  Orientation:  Full (Time, Place, and Person)  Thought Content:  Logical  Suicidal Thoughts:  No  Homicidal Thoughts:  No  Memory:  Immediate;   Good Recent;   Fair Remote;   Fair  Judgement:  Fair  Insight:  Fair  Psychomotor Activity:  Decreased  Concentration:  Good  Recall:  Good  Fund of Knowledge:Good  Language: Good  Akathisia:  No  Handed:  Right   AIMS (if indicated):     Assets:  Communication Skills Desire for Improvement Financial Resources/Insurance Housing Physical Health Resilience Social Support  Sleep:  Number of Hours: 8.5  Cognition: WNL  ADL's:  Intact   Mental Status Per Nursing Assessment::   On Admission:     Demographic Factors:  Adolescent or young adult, Caucasian, Low socioeconomic status and Unemployed  Loss Factors: Financial problems/change in socioeconomic status  Historical Factors: Prior suicide attempts  Risk Reduction Factors:   Responsible for children under 518 years of age and Sense of responsibility to family  Continued Clinical Symptoms:  Bipolar Disorder:   Depressive phase  Cognitive Features That Contribute To Risk:  Closed-mindedness    Suicide Risk:  Mild:  Suicidal ideation of limited frequency, intensity, duration, and specificity.  There are no identifiable plans, no associated intent, mild dysphoria and related symptoms, good self-control (both objective and subjective assessment), few other risk factors, and identifiable protective factors, including available and accessible social support.  Follow-up Information    [facility]   Why:  1:00pm with Cam Hines for your initial assessment on 07/07/2016. Medication management appointment scheduled for 3/21 at 9:15am with Dr. Lenoria FarrierAkers. Contact information: [address]  [place] [address] P: [phone] F: [phone]       [facility]. Call.   Why:  Call to reschedule your  appointment that was on 06/30/2016 at 11:00am for your intial intake. Please arrive 20 minutes prior to your scheduled appointment. Contact information: [address] [place], [place] [address] Phone: [phone] Fax: [phone]  Plan Of Care/Follow-up recommendations:  Activity:  Activity as tolerated Diet:  Regular diet Other:  Follow-up with outpatient ECT as well  as providerIn Marlou Porch, MD 07/03/2016, 1:17 PM

## 2016-07-03 NOTE — Discharge Summary (Signed)
Physician Discharge Summary Note  Patient:  [name] is an 26 y.o., female MRN:  [id number] DOB:  [birth year] Patient phone:  [phone] (home)  Patient address:   [address] [place] [address],  Total Time spent with patient: 45 minutes  Date of Admission:  06/26/2016 Date of Discharge: 07/03/2016  Reason for Admission:  Patient was admitted in transfer from behavioral health Hospital because of persistent bipolar depression unresponsive to medication with recommendation of ECT.  Principal Problem: Bipolar depression [facility]) Discharge Diagnoses: Patient Active Problem List   Diagnosis Date Noted  . Bipolar depression (HCC) [F31.30] 06/26/2016  . Bipolar I disorder, most recent episode depressed (HCC) [F31.30]   . Major depressive disorder, recurrent severe without psychotic features (HCC) [F33.2] 06/15/2016  . Major depressive disorder, single episode, severe without psychosis (HCC) [F32.2] 06/15/2016  . Normal labor and delivery [O80] 11/23/2010    Past Psychiatric History: Patient has a past history of depression and manic spells with a diagnosis of bipolar disorder most recent spell of depression has been persistent and has included suicidal ideation and some psychotic episodes. Did not respond well to current medication and was referred for ECT.  Past Medical History:  Past Medical History:  Diagnosis Date  . Anxiety   . Bipolar 1 disorder (HCC)   . Costochondritis   . Depression   . History of kidney stones   . PONV (postoperative nausea and vomiting)     Past Surgical History:  Procedure Laterality Date  . APPENDECTOMY    . CHOLECYSTECTOMY    . LAPAROSCOPY N/A 04/16/2015   Procedure: LAPAROSCOPY DIAGNOSTIC;  Surgeon: Levi Aland, MD;  Location: [facility];  Service: Gynecology;  Laterality: N/A;   Family History:  Family History  Problem Relation Age of Onset  . Alcohol abuse Father   . Depression Father   . Mental illness Father   .  Drug abuse Father   . Depression Mother   . Birth defects Sister   . Depression Maternal Grandmother   . Heart disease Maternal Grandmother   . Hypertension Maternal Grandmother   . Depression Maternal Grandfather   . Heart disease Maternal Grandfather   . Hypertension Maternal Grandfather   . Depression Paternal Grandmother   . Heart disease Paternal Grandmother   . Hypertension Paternal Grandmother   . Depression Paternal Grandfather   . Heart disease Paternal Grandfather   . Cancer Paternal Grandfather   . Alcohol abuse Paternal Grandfather   . Hypertension Paternal Grandfather    Family Psychiatric  History: Positive for depression Social History:  History  Alcohol Use  . Yes     History  Drug Use No    Social History   Social History  . Marital status: Single    Spouse name: N/A  . Number of children: N/A  . Years of education: N/A   Social History Main Topics  . Smoking status: Never Smoker  . Smokeless tobacco: Never Used  . Alcohol use Yes  . Drug use: No  . Sexual activity: Yes    Birth control/ protection: None   Other Topics Concern  . None   Social History Narrative  . None    Hospital Course:  Patient was admitted to the hospital and evaluated on a Friday. Plans were initiated to begin ECT treatment by Monday. Medication changes included a slight increase in her Effexor dose. Patient started right unilateral ECT on Monday and as of the day of discharge has  had 3 treatments all of which have been well tolerated. Patient has not engaged in any dangerous behavior on the unit. He is now denying any current suicidal thoughts. She is agreeable to continuing a course of ECT after discharge. Plan is for discharge home today and follow-up with Monday Wednesday and Friday outpatient ECT next week. Prescriptions completed for current medicine.  Physical Findings: AIMS: Facial and Oral Movements Muscles of Facial Expression: None, normal Lips and Perioral Area:  None, normal Jaw: None, normal Tongue: None, normal,Extremity Movements Upper (arms, wrists, hands, fingers): None, normal Lower (legs, knees, ankles, toes): None, normal, Trunk Movements Neck, shoulders, hips: None, normal, Overall Severity Severity of abnormal movements (highest score from questions above): None, normal Incapacitation due to abnormal movements: None, normal Patient's awareness of abnormal movements (rate only patient's report): No Awareness, Dental Status Current problems with teeth and/or dentures?: No Does patient usually wear dentures?: No  CIWA:    COWS:     Musculoskeletal: Strength & Muscle Tone: within normal limits Gait & Station: normal Patient leans: N/A  Psychiatric Specialty Exam: Physical Exam  Nursing note and vitals reviewed. Constitutional: She appears well-developed and well-nourished.  HENT:  Head: Normocephalic and atraumatic.  Eyes: Conjunctivae are normal. Pupils are equal, round, and reactive to light.  Neck: Normal range of motion.  Cardiovascular: Regular rhythm and normal heart sounds.   Respiratory: Effort normal. No respiratory distress.  GI: Soft.  Musculoskeletal: Normal range of motion.  Neurological: She is alert.  Skin: Skin is warm and dry.  Psychiatric: Judgment normal. Her speech is delayed. She is slowed. Cognition and memory are normal. She exhibits a depressed mood. She expresses no suicidal ideation.    Review of Systems  Constitutional: Negative.   HENT: Negative.   Eyes: Negative.   Respiratory: Negative.   Cardiovascular: Negative.   Gastrointestinal: Negative.   Musculoskeletal: Negative.   Skin: Negative.   Neurological: Negative.   Psychiatric/Behavioral: Positive for depression. Negative for hallucinations, memory loss, substance abuse and suicidal ideas. The patient has insomnia. The patient is not nervous/anxious.     Blood pressure 116/77, pulse (!) 113, temperature 98.2 F (36.8 C), resp. rate (!) 21,  height 5\' 4"  (1.626 m), weight 69.9 kg (154 lb), last menstrual period 06/12/2016, SpO2 98 %, unknown if currently breastfeeding.Body mass index is 26.43 kg/m.  General Appearance: Casual  Eye Contact:  Fair  Speech:  Clear and Coherent  Volume:  Normal  Mood:  Dysphoric  Affect:  Constricted  Thought Process:  Goal Directed  Orientation:  Full (Time, Place, and Person)  Thought Content:  Logical  Suicidal Thoughts:  No  Homicidal Thoughts:  No  Memory:  Immediate;   Good Recent;   Fair Remote;   Fair  Judgement:  Good  Insight:  Good  Psychomotor Activity:  Normal  Concentration:  Concentration: Fair  Recall:  Fair  Fund of Knowledge:  Fair  Language:  Fair  Akathisia:  No  Handed:  Right  AIMS (if indicated):     Assets:  Communication Skills Desire for Improvement Financial Resources/Insurance Housing Physical Health Resilience  ADL's:  Intact  Cognition:  WNL  Sleep:  Number of Hours: 8.5        Has this patient used any form of tobacco in the last 30 days? (Cigarettes, Smokeless Tobacco, Cigars, and/or Pipes) Yes, No  Blood Alcohol level:  Lab Results  Component Value Date   ETH <5 06/14/2016    Metabolic Disorder Labs:  Lab  Results  Component Value Date   HGBA1C 4.7 (L) 06/17/2016   MPG 88 06/17/2016   Lab Results  Component Value Date   PROLACTIN 60.7 (H) 06/17/2016   Lab Results  Component Value Date   CHOL 118 06/17/2016   TRIG 100 06/17/2016   HDL 33 (L) 06/17/2016   CHOLHDL 3.6 06/17/2016   VLDL 20 06/17/2016   LDLCALC 65 06/17/2016    See Psychiatric Specialty Exam and Suicide Risk Assessment completed by Attending Physician prior to discharge.  Discharge destination:  Home  Is patient on multiple antipsychotic therapies at discharge:  No   Has Patient had three or more failed trials of antipsychotic monotherapy by history:  No  Recommended Plan for Multiple Antipsychotic Therapies: NA  Discharge Instructions    Diet - low  sodium heart healthy    Complete by:  As directed    Increase activity slowly    Complete by:  As directed      Allergies as of 07/03/2016      Reactions   Phenergan [promethazine Hcl] Other (See Comments)   Reaction:  Shaking       Medication List    STOP taking these medications   acetaminophen 325 MG tablet Commonly known as:  TYLENOL   alum & mag hydroxide-simeth 200-200-20 MG/5ML suspension Commonly known as:  MAALOX/MYLANTA   hydrOXYzine 25 MG tablet Commonly known as:  ATARAX/VISTARIL   ibuprofen 600 MG tablet Commonly known as:  ADVIL,MOTRIN   LORazepam 0.5 MG tablet Commonly known as:  ATIVAN   magnesium hydroxide 400 MG/5ML suspension Commonly known as:  MILK OF MAGNESIA   MULTIVITAMIN GUMMIES ADULT Chew   thiamine 100 MG tablet     TAKE these medications     Indication  lamoTRIgine 25 MG tablet Commonly known as:  LAMICTAL Take 1 tablet (25 mg total) by mouth 2 (two) times daily. For mood stabilization  Indication:  Manic-Depression, Mood stabilization   liothyronine 25 MCG tablet Commonly known as:  CYTOMEL Take 1 tablet (25 mcg total) by mouth daily. What changed:  additional instructions  Indication:  Underactive Thyroid   QUEtiapine 400 MG tablet Commonly known as:  SEROQUEL Take 1 tablet (400 mg total) by mouth at bedtime. What changed:  additional instructions  Indication:  Depressive Phase of Manic-Depression   topiramate 50 MG tablet Commonly known as:  TOPAMAX Take 1 tablet (50 mg total) by mouth 2 (two) times daily. What changed:  additional instructions  Indication:  Migraine Headache   traZODone 100 MG tablet Commonly known as:  DESYREL Take 1 tablet (100 mg total) by mouth at bedtime as needed for sleep.  Indication:  Trouble Sleeping   venlafaxine XR 150 MG 24 hr capsule Commonly known as:  EFFEXOR-XR Take 1 capsule (150 mg total) by mouth daily with breakfast. What changed:  medication strength  how much to  take  additional instructions  Indication:  Major Depressive Disorder      Follow-up Information    [facility].   Why:  1:00pm with Cam Hines for your initial assessment on 07/07/2016. Medication management appointment scheduled for 3/21 at 9:15am with Dr. Lenoria Farrier. Contact information: [address] [place] [address] P: [phone] F: [phone]       [facility]. Call.   Why:  Call to reschedule your  appointment that was on 06/30/2016 at 11:00am for your intial intake. Please arrive 20 minutes prior to your scheduled appointment. Contact information: [address] [place], [place] [address] Phone: [phone] Fax: [phone]           Follow-up recommendations:  Activity:  Activity as tolerated Diet:  Regular diet Other:  Follow-up on Monday with outpatient ECT as well as continuing current medicine and follow-up with provider in home community  Comments:  Discharged today with follow-up in the community and also with the continued index course of ECT at least through next week  Signed: Mordecai RasmussenJohn Jhace Fennell, MD 07/03/2016, 1:22 PM

## 2016-07-03 NOTE — Anesthesia Procedure Notes (Signed)
Date/Time: 07/03/2016 12:46 PM Performed by: Lily KocherPERALTA, Nyal Schachter Pre-anesthesia Checklist: Patient identified, Emergency Drugs available, Suction available and Patient being monitored Patient Re-evaluated:Patient Re-evaluated prior to inductionOxygen Delivery Method: Circle system utilized Preoxygenation: Pre-oxygenation with 100% oxygen Intubation Type: IV induction Ventilation: Mask ventilation without difficulty and Mask ventilation throughout procedure Airway Equipment and Method: Bite block Placement Confirmation: positive ETCO2 Dental Injury: Teeth and Oropharynx as per pre-operative assessment

## 2016-07-03 NOTE — Plan of Care (Signed)
Problem: [facility] Participation in Recreation Therapeutic Interventions Goal: STG-Patient will demonstrate improved self esteem by identif STG: Self-Esteem - Within 4 treatment sessions, patient will verbalize at least 5 positive affirmation statements in each of 2 treatment sessions to increase self-esteem.  Outcome: Completed/Met Date Met: 07/03/16 Treatment Session 2; Completed 2 out of 2: At approximately 8:25 am, LRT met with patient in patient room. Patient verbalized 5 positive affirmation statements. Patient reported it felt "not as bad". LRT encouraged patient to continue saying positive affirmation statements.  Leonette Monarch, LRT/CTRS  02.23.18 2:19 pm Goal: STG-Other Recreation Therapy Goal (Specify) STG: Stress Management - Within 4 treatment sessions, patient will verbalize understanding of the stress management techniques in each of 2 treatment sessions to increase stress management skills.  Outcome: Completed/Met Date Met: 07/03/16 Treatment Session 2; Completed 2 out of 2: At approximately 8:25 am, LRT met with patient in patient room. Patient reported she read over and practiced the stress management techniques. Patient verbalized understanding of the techniques. LRT encouraged patient to continue practicing the stress management techniques.  Leonette Monarch, LRT/CTRS 02.23.18 2:20 pm

## 2016-07-03 NOTE — Progress Notes (Signed)
Patient ID: [name], female   DOB: 02/24/1991, 26 y.o.   MRN: [id number]  CSW called [facility] on 07/01/2016 to reschedule patient's appointment that was originally scheduled for 06/30/2016. No answer, CSW left voicemail with contact information asking for a return call to reschedule patient's appointment.  CSW made another [facility] on 07/03/2016 to reschedule patient's appointment. Still no answer at agency, CSW left voicemail asking staff to contact CSW as soon as possible so appointment can be rescheduled.   Patient has [facility] and medication management.    Mekel Haverstock G. Garnette CzechSampson MSW, [facility]-PsychCSWA 07/03/2016 10:41 AM

## 2016-07-03 NOTE — Progress Notes (Signed)
D: Pt denies SI/HI/AVH. Pt is pleasant and cooperative with treatment plan, affect is flat and sad but brightens upon approach. Patient stated she feels better from doing ECT, she appears less anxious, minimal interaction with peers and staff noted. Patient rated depression a 5/10.  A: Pt was offered support and encouragement. Pt was given scheduled medications. Pt was encouraged to attend groups. Q 15 minute checks were done for safety.  R:Pt did not attend group. Pt is taking medication. Pt has no complaints.Pt receptive to treatment and safety maintained on unit.

## 2016-07-03 NOTE — [facility] Group Notes (Signed)
BHH LCSW Group Therapy  07/03/2016 11:20 AM  Type of Therapy:  Group Therapy  Participation Level:  Active  Participation Quality:  Attentive  Affect:  Appropriate  Cognitive:  Alert  Insight:  Developing/Improving  Engagement in Therapy:  Developing/Improving  Modes of Intervention:  Discussion, Education, Problem-solving, Reality Testing and Support  Summary of Progress/Problems: Feelings around Relapse. Group members discussed the meaning of relapse and shared personal stories of relapse, how it affected them and others, and how they perceived themselves during this time. Group members were encouraged to identify triggers, warning signs and coping skills used when facing the possibility of relapse. Social supports were discussed and explored in detail. Patients also discussed facing disappointment and how that can trigger someone to relapse.   [name] G. Garnette CzechSampson MSW, LCSWA 07/03/2016, 11:21 AM

## 2016-07-03 NOTE — Progress Notes (Signed)
Affect sad.  Denies SI/HI/AVH. Discharge instructions given, verbalized understanding.  Prescriptions given and personal belongings returned.  Escorted off unit by this Clinical research associatewriter to main entrance to travel home with father.

## 2016-07-03 NOTE — Transfer of Care (Signed)
Immediate Anesthesia Transfer of Care Note  Patient: [name]  Procedure(s) Performed: ECT  Patient Location: PACU  Anesthesia Type:General  Level of Consciousness: sedated  Airway & Oxygen Therapy: Patient Spontanous Breathing and Patient connected to face mask oxygen  Post-op Assessment: Report given to RN and Post -op Vital signs reviewed and stable  Post vital signs: Reviewed and stable  Last Vitals:  Vitals:   07/03/16 0647 07/03/16 1028  BP: 111/62 (!) 112/56  Pulse: (!) 103   Resp: 16 18  Temp: 36.8 C 36.8 C    Last Pain:  Vitals:   07/03/16 1028  TempSrc: Oral  PainSc:       Patients Stated Pain Goal: 0 (06/29/16 2123)  Complications: No apparent anesthesia complications

## 2016-07-03 NOTE — Procedures (Signed)
ECT SERVICES Physician's Interval Evaluation & Treatment Note  Patient Identification: [name] MRN:  [id number] Date of Evaluation:  07/03/2016 TX #: 3  MADRS:   MMSE:   P.E. Findings:  No change to pe  Psychiatric Interval Note:  Mood depressed not suicidal  Subjective:  Patient is a 26 y.o. female seen for evaluation for Electroconvulsive Therapy. depression  Treatment Summary:   [x]   Right Unilateral             []  Bilateral   % Energy : 0.133ms 45%   Impedance: 930 ohms  Seizure Energy Index: [id number] microvolts squared  Postictal Suppression Index: 93%  Seizure Concordance Index: 95%  Medications  Pre Shock: tor 30mg , brev 70mg , suc 80mg   Post Shock:    Seizure Duration: 18 sec by emg, 45s by eeg   Comments: outpt mwf  Lungs:  [x]   Clear to auscultation               []  Other:   Heart:    [x]   Regular rhythm             []  irregular rhythm    [x]   Previous H&P reviewed, patient examined and there are NO CHANGES                 []   Previous H&P reviewed, patient examined and there are changes noted.   Mordecai RasmussenJohn Aberdeen Hafen, MD 2/23/201812:40 PM

## 2016-07-03 NOTE — Progress Notes (Signed)
Recreation Therapy Notes  INPATIENT RECREATION TR PLAN  Patient Details Name: [name] MRN: [id number] DOB: 12-25-1990 Today's Date: 07/03/2016  Rec Therapy Plan Is patient appropriate for Therapeutic Recreation?: Yes Treatment times per week: At least once a week TR Treatment/Interventions: 1:1 session, Group participation (Comment) (Appropriate participation in daily recreational therapy tx)  Discharge Criteria Pt will be discharged from therapy if:: Treatment goals are met, Discharged Treatment plan/goals/alternatives discussed and agreed upon by:: Patient/family  Discharge Summary Short term goals set: See Care Plan Short term goals met: Complete Progress toward goals comments: One-to-one attended Which groups?: Other (Comment), Leisure education Water engineer) One-to-one attended: Self-esteem, stress management Reason goals not met: N/A Therapeutic equipment acquired: None Reason patient discharged from therapy: Discharge from hospital Pt/family agrees with progress & goals achieved: Yes Date patient discharged from therapy: 07/03/16   Leonette Monarch, LRT/CTRS 07/03/2016, 3:47 PM

## 2016-07-03 NOTE — Anesthesia Post-op Follow-up Note (Cosign Needed)
Anesthesia QCDR form completed.        

## 2016-07-03 NOTE — H&P (Signed)
[name] is an 26 y.o. female.   Chief Complaint: ongoing depression HPI: severe recurrent depression  Past Medical History:  Diagnosis Date  . Anxiety   . Bipolar 1 disorder (HCC)   . Costochondritis   . Depression   . History of kidney stones   . PONV (postoperative nausea and vomiting)     Past Surgical History:  Procedure Laterality Date  . APPENDECTOMY    . CHOLECYSTECTOMY    . LAPAROSCOPY N/A 04/16/2015   Procedure: LAPAROSCOPY DIAGNOSTIC;  Surgeon: Levi AlandMark E Anderson, MD;  Location: [facility];  Service: Gynecology;  Laterality: N/A;    Family History  Problem Relation Age of Onset  . Alcohol abuse Father   . Depression Father   . Mental illness Father   . Drug abuse Father   . Depression Mother   . Birth defects Sister   . Depression Maternal Grandmother   . Heart disease Maternal Grandmother   . Hypertension Maternal Grandmother   . Depression Maternal Grandfather   . Heart disease Maternal Grandfather   . Hypertension Maternal Grandfather   . Depression Paternal Grandmother   . Heart disease Paternal Grandmother   . Hypertension Paternal Grandmother   . Depression Paternal Grandfather   . Heart disease Paternal Grandfather   . Cancer Paternal Grandfather   . Alcohol abuse Paternal Grandfather   . Hypertension Paternal Grandfather    Social History:  reports that she has never smoked. She has never used smokeless tobacco. She reports that she drinks alcohol. She reports that she does not use drugs.  Allergies:  Allergies  Allergen Reactions  . Phenergan [Promethazine Hcl] Other (See Comments)    Reaction:  Shaking     Medications Prior to Admission  Medication Sig Dispense Refill  . acetaminophen (TYLENOL) 325 MG tablet Take 2 tablets (650 mg total) by mouth every 6 (six) hours as needed for mild pain.    Marland Kitchen. alum & mag hydroxide-simeth (MAALOX/MYLANTA) 200-200-20 MG/5ML suspension Take 30 mLs by mouth every 4 (four) hours as needed for indigestion.  355 mL 0  . hydrOXYzine (ATARAX/VISTARIL) 25 MG tablet Take 1 tablet (25 mg total) by mouth every 6 (six) hours as needed (anxiety / agitateion). 30 tablet 0  . ibuprofen (ADVIL,MOTRIN) 600 MG tablet Take 1 tablet (600 mg total) by mouth every 6 (six) hours as needed for fever, headache or moderate pain. 30 tablet 0  . lamoTRIgine (LAMICTAL) 25 MG tablet Take 1 tablet (25 mg total) by mouth 2 (two) times daily. For mood stabilization    . liothyronine (CYTOMEL) 25 MCG tablet Take 1 tablet (25 mcg total) by mouth daily. For Hypothyroidism.    Marland Kitchen. LORazepam (ATIVAN) 0.5 MG tablet Take 1 tablet (0.5 mg total) by mouth every 6 (six) hours as needed for anxiety. 30 tablet 0  . magnesium hydroxide (MILK OF MAGNESIA) 400 MG/5ML suspension Take 30 mLs by mouth daily as needed for mild constipation. 360 mL 0  . Multiple Vitamins-Minerals (MULTIVITAMIN GUMMIES ADULT) CHEW Chew 1 each by mouth 2 (two) times daily. Vitamin replacement    . QUEtiapine (SEROQUEL) 400 MG tablet Take 1 tablet (400 mg total) by mouth at bedtime. For mood control    . thiamine 100 MG tablet Take 1 tablet (100 mg total) by mouth daily. For low thiamine    . topiramate (TOPAMAX) 50 MG tablet Take 1 tablet (50 mg total) by mouth 2 (two) times daily. For mood stabilization    . venlafaxine XR (EFFEXOR-XR)  37.5 MG 24 hr capsule Take 3 capsules (112.5 mg total) by mouth daily with breakfast. For depression      Results for orders placed or performed during the hospital encounter of 06/26/16 (from the past 48 hour(s))  Glucose, capillary     Status: None   Collection Time: 07/03/16  6:10 AM  Result Value Ref Range   Glucose-Capillary 81 65 - 99 mg/dL   No results found.  Review of Systems  Constitutional: Negative.   HENT: Negative.   Eyes: Negative.   Respiratory: Negative.   Cardiovascular: Negative.   Gastrointestinal: Negative.   Musculoskeletal: Negative.   Skin: Negative.   Neurological: Negative.    Psychiatric/Behavioral: Positive for depression. Negative for hallucinations, memory loss, substance abuse and suicidal ideas. The patient is not nervous/anxious and does not have insomnia.     Blood pressure (!) 112/56, pulse (!) 103, temperature 98.3 F (36.8 C), temperature source Oral, resp. rate 18, height 5\' 4"  (1.626 m), weight 69.9 kg (154 lb), last menstrual period 06/12/2016, SpO2 100 %, unknown if currently breastfeeding. Physical Exam  Nursing note and vitals reviewed. Constitutional: She appears well-developed and well-nourished.  HENT:  Head: Normocephalic and atraumatic.  Eyes: Conjunctivae are normal. Pupils are equal, round, and reactive to light.  Neck: Normal range of motion.  Cardiovascular: Regular rhythm and normal heart sounds.   Respiratory: Effort normal. No respiratory distress.  GI: Soft.  Musculoskeletal: Normal range of motion.  Neurological: She is alert.  Skin: Skin is warm and dry.  Psychiatric: Judgment normal. Her affect is blunt. Her speech is delayed. She is slowed. Cognition and memory are normal. She exhibits a depressed mood. She expresses no suicidal ideation. She exhibits normal remote memory.     Assessment/Plan ECT today with discharge and follow up as outpatient  Mordecai Rasmussen, MD 07/03/2016, 12:38 PM

## 2016-07-03 NOTE — [facility] Group Notes (Signed)
[facility] Group Notes:  (Nursing/MHT/Case Management/Adjunct)  Date:  07/03/2016  Time:  12:13 AM  Type of Therapy:  Psychoeducational Skills  Participation Level:  Did Not Attend  Participation Quality:Summary of Progress/Problems:  Mayra NeerJackie L Amanat Hackel 07/03/2016, 12:13 AM

## 2016-07-03 NOTE — Anesthesia Postprocedure Evaluation (Signed)
Anesthesia Post Note  Patient: Tina DubinKaitlyn L Morrone  Procedure(s) Performed: * No procedures listed *  Patient location during evaluation: PACU Anesthesia Type: General Level of consciousness: awake and alert Pain management: pain level controlled Vital Signs Assessment: post-procedure vital signs reviewed and stable Respiratory status: spontaneous breathing, nonlabored ventilation, respiratory function stable and patient connected to nasal cannula oxygen Cardiovascular status: blood pressure returned to baseline and stable Postop Assessment: no signs of nausea or vomiting Anesthetic complications: no     Last Vitals:  Vitals:   07/03/16 0647 07/03/16 1028  BP: 111/62 (!) 112/56  Pulse: (!) 103   Resp: 16 18  Temp: 36.8 C 36.8 C    Last Pain:  Vitals:   07/03/16 1028  TempSrc: Oral  PainSc:                  Lenard SimmerAndrew Tahni Porchia

## 2016-07-06 ENCOUNTER — Other Ambulatory Visit: Payer: Self-pay | Admitting: Psychiatry

## 2016-07-06 ENCOUNTER — Encounter
Admission: RE | Admit: 2016-07-06 | Discharge: 2016-07-06 | Disposition: A | Discharge status: Home / Self Care | Payer: 59 | Source: Ambulatory Visit | Attending: Psychiatry | Admitting: Psychiatry

## 2016-07-06 ENCOUNTER — Encounter: Payer: Self-pay | Admitting: Anesthesiology

## 2016-07-06 DIAGNOSIS — Z9049 Acquired absence of other specified parts of digestive tract: Secondary | ICD-10-CM | POA: Diagnosis not present

## 2016-07-06 DIAGNOSIS — Z9889 Other specified postprocedural states: Secondary | ICD-10-CM | POA: Diagnosis not present

## 2016-07-06 DIAGNOSIS — F332 Major depressive disorder, recurrent severe without psychotic features: Secondary | ICD-10-CM

## 2016-07-06 DIAGNOSIS — Z818 Family history of other mental and behavioral disorders: Secondary | ICD-10-CM | POA: Diagnosis not present

## 2016-07-06 MED ORDER — SODIUM CHLORIDE 0.9 % IV SOLN
INTRAVENOUS | Status: DC | PRN
  Administered 2016-07-06: 12:00:00 via INTRAVENOUS

## 2016-07-06 MED ORDER — ONDANSETRON HCL 4 MG/2ML IJ SOLN
4.0000 mg | Freq: Once | INTRAMUSCULAR | Status: AC
  Administered 2016-07-06: 4 mg via INTRAVENOUS

## 2016-07-06 MED ORDER — ONDANSETRON HCL 4 MG PO TABS
4.0000 mg | ORAL_TABLET | Freq: Once | ORAL | Status: AC
  Administered 2016-07-06: 4 mg via ORAL

## 2016-07-06 MED ORDER — KETOROLAC TROMETHAMINE 30 MG/ML IJ SOLN
INTRAMUSCULAR | Status: AC
  Filled 2016-07-06: qty 1

## 2016-07-06 MED ORDER — KETOROLAC TROMETHAMINE 30 MG/ML IJ SOLN
30.0000 mg | Freq: Once | INTRAMUSCULAR | Status: AC
  Administered 2016-07-06: 30 mg via INTRAVENOUS

## 2016-07-06 MED ORDER — SODIUM CHLORIDE 0.9 % IV SOLN
500.0000 mL | Freq: Once | INTRAVENOUS | Status: AC
  Administered 2016-07-06: 10:00:00 via INTRAVENOUS

## 2016-07-06 MED ORDER — SUCCINYLCHOLINE CHLORIDE 20 MG/ML IJ SOLN
INTRAMUSCULAR | Status: DC | PRN
  Administered 2016-07-06: 80 mg via INTRAVENOUS

## 2016-07-06 MED ORDER — ONDANSETRON HCL 4 MG/2ML IJ SOLN
INTRAMUSCULAR | Status: AC
  Administered 2016-07-06: 4 mg via INTRAVENOUS
  Filled 2016-07-06: qty 2

## 2016-07-06 MED ORDER — METHOHEXITAL SODIUM 100 MG/10ML IV SOSY
PREFILLED_SYRINGE | INTRAVENOUS | Status: DC | PRN
  Administered 2016-07-06: 70 mg via INTRAVENOUS

## 2016-07-06 NOTE — Progress Notes (Signed)
Patient able to tolerate drinking 12 ounces of Ginger Ale and 2 saltine crackers. Father is at the patients side at the present time.

## 2016-07-06 NOTE — Anesthesia Postprocedure Evaluation (Signed)
Anesthesia Post Note  Patient: [name]  Procedure(s) Performed: * No procedures listed *  Patient location during evaluation: PACU Anesthesia Type: General Level of consciousness: awake and alert Pain management: pain level controlled Vital Signs Assessment: post-procedure vital signs reviewed and stable Respiratory status: spontaneous breathing, nonlabored ventilation, respiratory function stable and patient connected to nasal cannula oxygen Cardiovascular status: blood pressure returned to baseline and stable Postop Assessment: no signs of nausea or vomiting Anesthetic complications: no     Last Vitals:  Vitals:   07/06/16 1209 07/06/16 1225  BP: (!) 94/51 114/69  Pulse: (!) 110 (!) 115  Resp: (!) 24 (!) 24  Temp:  36.7 C    Last Pain:  Vitals:   07/06/16 1155  TempSrc:   PainSc: Asleep                 Cleda MccreedyJoseph K Piscitello

## 2016-07-06 NOTE — Procedures (Signed)
ECT SERVICES Physician's Interval Evaluation & Treatment Note  Patient Identification: [name] MRN:  [id number] Date of Evaluation:  07/06/2016 TX #: 4  MADRS: 24  MMSE: 30  P.E. Findings:  No change to physical exam  Psychiatric Interval Note:  Affect little tearful seems situational today.  Subjective:  Patient is a 26 y.o. female seen for evaluation for Electroconvulsive Therapy. Mood still depressed slightly improved  Treatment Summary:   [x]   Right Unilateral             []  Bilateral   % Energy : 0.3 ms 45%   Impedance: 980 ohms  Seizure Energy Index: 12,225 V squared  Postictal Suppression Index: 96%  Seizure Concordance Index: 88%  Medications  Pre Shock: Toradol 30 mg Brevital 70 mg succinylcholine 80 mg  Post Shock:    Seizure Duration: 24 seconds by EMG 41 seconds by EEG   Comments: Follow-up Wednesday and Friday   Lungs:  [x]   Clear to auscultation               []  Other:   Heart:    [x]   Regular rhythm             []  irregular rhythm    [x]   Previous H&P reviewed, patient examined and there are NO CHANGES                 []   Previous H&P reviewed, patient examined and there are changes noted.   [name], MD 2/26/201811:36 AM

## 2016-07-06 NOTE — Progress Notes (Signed)
Patient returned from PACU with nausea. She states she has been nauseous since she awakened from ECT. Dr. Toni Amendlapacs notified and order Zofran for the patient . Patient is able to sit in a wheelchair without assistance.

## 2016-07-06 NOTE — Anesthesia Post-op Follow-up Note (Cosign Needed)
Anesthesia QCDR form completed.        

## 2016-07-06 NOTE — Anesthesia Preprocedure Evaluation (Signed)
Anesthesia Evaluation  Patient identified by MRN, date of birth, ID band Patient awake    Reviewed: Allergy & Precautions, H&P , NPO status , Patient's Chart, lab work & pertinent test results  History of Anesthesia Complications (+) PONV and history of anesthetic complications  Airway Mallampati: II  TM Distance: >3 FB Neck ROM: full    Dental no notable dental hx. (+) Poor Dentition, Chipped   Pulmonary neg pulmonary ROS,    Pulmonary exam normal        Cardiovascular negative cardio ROS Normal cardiovascular exam     Neuro/Psych PSYCHIATRIC DISORDERS Anxiety Depression Bipolar Disorder negative neurological ROS     GI/Hepatic negative GI ROS, Neg liver ROS,   Endo/Other  negative endocrine ROS  Renal/GU negative Renal ROS     Musculoskeletal   Abdominal Normal abdominal exam  (+)   Peds  Hematology negative hematology ROS (+)   Anesthesia Other Findings Past Medical History: No date: Anxiety No date: Bipolar 1 disorder (HCC) No date: Costochondritis No date: Depression No date: History of kidney stones No date: PONV (postoperative nausea and vomiting)   Reproductive/Obstetrics negative OB ROS                             Anesthesia Physical  Anesthesia Plan  ASA: III  Anesthesia Plan: General   Post-op Pain Management:    Induction: Intravenous  Airway Management Planned: Mask  Additional Equipment:   Intra-op Plan:   Post-operative Plan:   Informed Consent: I have reviewed the patients History and Physical, chart, labs and discussed the procedure including the risks, benefits and alternatives for the proposed anesthesia with the patient or authorized representative who has indicated his/her understanding and acceptance.   Dental Advisory Given  Plan Discussed with: CRNA and Surgeon  Anesthesia Plan Comments:         Anesthesia Quick Evaluation

## 2016-07-06 NOTE — Discharge Instructions (Signed)
1)  The drugs that you have been given will stay in your system until tomorrow so for the       next 24 hours you should not:  A. Drive an automobile  B. Make any legal decisions  C. Drink any alcoholic beverages  2)  You may resume your regular meals upon return home.  3)  A responsible adult must take you home.  Someone should stay with you for a few          hours, then be available by phone for the remainder of the treatment day.  4)  You May experience any of the following symptoms:  Headache, Nausea and a dry mouth (due to the medications you were given),  temporary memory loss and some confusion, or sore muscles (a warm bath  should help this).  If you you experience any of these symptoms let us know on                your return visit.  5)  Report any of the following: any acute discomfort, severe headache, or temperature        greater than 100.5 F.   Also report any unusual redness, swelling, drainage, or pain         at your IV site.    You may report Symptoms to:  [facility] [place]          Phone: [phone], ECT Department           or Dr. Shary Keylapac's office [phone]  6)  Your next ECT Treatment is Day Wednesday  Date February 28,2018 at 900am  We will call 2 days prior to your scheduled appointment for arrival times.  7)  Nothing to eat or drink after midnight the night before your procedure.  8)  Take .    With a sip of water the morning of your procedure.  9)  Other Instructions: Call [phone] to cancel the morning of your procedure due         to illness or emergency.  10) We will call within 72 hours to assess how you are feeling.

## 2016-07-06 NOTE — Transfer of Care (Signed)
Immediate Anesthesia Transfer of Care Note  Patient: [name]  Procedure(s) Performed: * No procedures listed *  Patient Location: PACU  Anesthesia Type:General  Level of Consciousness: patient cooperative and lethargic  Airway & Oxygen Therapy: Patient Spontanous Breathing and Patient connected to face mask oxygen  Post-op Assessment: Report given to RN and Post -op Vital signs reviewed and stable  Post vital signs: Reviewed and stable  Last Vitals:  Vitals:   07/06/16 0921 07/06/16 1155  BP: 130/76 (!) 175/9  Pulse: (!) 108   Resp: 16   Temp: 36.8 C 36.7 C    Last Pain:  Vitals:   07/06/16 0921  TempSrc: Oral         Complications: No apparent anesthesia complications

## 2016-07-06 NOTE — H&P (Signed)
[name] is an 26 y.o. female.   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 Anxiety   . Bipolar 1 disorder (HCC)   . Costochondritis   . Depression   . History of kidney stones   . PONV (postoperative nausea and vomiting)     Past Surgical History:  Procedure Laterality Date  . APPENDECTOMY    . CHOLECYSTECTOMY    . LAPAROSCOPY N/A 04/16/2015   Procedure: LAPAROSCOPY DIAGNOSTIC;  Surgeon: Levi AlandMark E Anderson, MD;  Location: [facility];  Service: Gynecology;  Laterality: N/A;    Family History  Problem Relation Age of Onset  . Alcohol abuse Father   . Depression Father   . Mental illness Father   . Drug abuse Father   . Depression Mother   . Birth defects Sister   . Depression Maternal Grandmother   . Heart disease Maternal Grandmother   . Hypertension Maternal Grandmother   . Depression Maternal Grandfather   . Heart disease Maternal Grandfather   . Hypertension Maternal Grandfather   . Depression Paternal Grandmother   . Heart disease Paternal Grandmother   . Hypertension Paternal Grandmother   . Depression Paternal Grandfather   . Heart disease Paternal Grandfather   . Cancer Paternal Grandfather   . Alcohol abuse Paternal Grandfather   . Hypertension Paternal Grandfather    Social History:  reports that she has never smoked. She has never used smokeless tobacco. She reports that she drinks alcohol. She reports that she does not use drugs.  Allergies:  Allergies  Allergen Reactions  . Phenergan [Promethazine Hcl] Other (See Comments)    Reaction:  Shaking      (Not in a hospital admission)  No results found for this or any previous visit (from the past 48 hour(s)). No results found.  Review of Systems  Constitutional: Negative.   HENT: Negative.   Eyes: Negative.   Respiratory: Negative.    Cardiovascular: Negative.   Gastrointestinal: Negative.   Musculoskeletal: Negative.   Skin: Negative.   Neurological: Negative.   Psychiatric/Behavioral: Positive for depression. Negative for hallucinations, memory loss, substance abuse and suicidal ideas. The patient is not nervous/anxious and does not have insomnia.     Blood pressure 130/76, pulse (!) 108, temperature 98.3 F (36.8 C), temperature source Oral, resp. rate 16, height 5\' 4"  (1.626 m), weight 69.4 kg (153 lb), last menstrual period 06/12/2016, SpO2 100 %, unknown if currently breastfeeding. Physical Exam  Nursing note and vitals reviewed. Constitutional: She appears well-developed and well-nourished.  HENT:  Head: Normocephalic and atraumatic.  Eyes: Conjunctivae are normal. Pupils are equal, round, and reactive to light.  Neck: Normal range of motion.  Cardiovascular: Regular rhythm and normal heart sounds.   Respiratory: Effort normal. No respiratory distress.  GI: Soft.  Musculoskeletal: Normal range of motion.  Neurological: She is alert.  Skin: Skin is warm and dry.  Psychiatric: Judgment normal. Her speech is delayed. She is slowed. Cognition and memory are normal. She exhibits a depressed mood. She expresses no suicidal ideation.     Assessment/Plan Continue index course all through this week  Mordecai RasmussenJohn Jaylin Benzel, MD 07/06/2016, 11:34 AM

## 2016-07-08 ENCOUNTER — Encounter: Payer: Self-pay | Admitting: Anesthesiology

## 2016-07-08 ENCOUNTER — Other Ambulatory Visit: Payer: Self-pay | Admitting: Psychiatry

## 2016-07-08 ENCOUNTER — Telehealth: Payer: Self-pay

## 2016-07-08 ENCOUNTER — Inpatient Hospital Stay: Admission: RE | Admit: 2016-07-08 | Payer: Self-pay | Source: Ambulatory Visit

## 2016-07-10 ENCOUNTER — Other Ambulatory Visit: Payer: Self-pay | Admitting: Psychiatry

## 2017-08-06 ENCOUNTER — Encounter (HOSPITAL_BASED_OUTPATIENT_CLINIC_OR_DEPARTMENT_OTHER): Payer: Self-pay

## 2017-08-06 ENCOUNTER — Other Ambulatory Visit: Payer: Self-pay

## 2017-08-06 ENCOUNTER — Emergency Department (HOSPITAL_BASED_OUTPATIENT_CLINIC_OR_DEPARTMENT_OTHER)
Admission: EM | Admit: 2017-08-06 | Discharge: 2017-08-06 | Disposition: A | Discharge status: Home / Self Care | Payer: Managed Care, Other (non HMO) | Attending: Emergency Medicine | Admitting: Emergency Medicine

## 2017-08-06 DIAGNOSIS — R51 Headache: Secondary | ICD-10-CM | POA: Diagnosis not present

## 2017-08-06 DIAGNOSIS — Z79899 Other long term (current) drug therapy: Secondary | ICD-10-CM | POA: Diagnosis not present

## 2017-08-06 DIAGNOSIS — R519 Headache, unspecified: Secondary | ICD-10-CM

## 2017-08-06 DIAGNOSIS — E860 Dehydration: Secondary | ICD-10-CM | POA: Diagnosis not present

## 2017-08-06 DIAGNOSIS — R42 Dizziness and giddiness: Secondary | ICD-10-CM | POA: Diagnosis present

## 2017-08-06 LAB — CBC
HEMATOCRIT: 39.7 % (ref 36.0–46.0)
HEMOGLOBIN: 14 g/dL (ref 12.0–15.0)
MCH: 29.7 pg (ref 26.0–34.0)
MCHC: 35.3 g/dL (ref 30.0–36.0)
MCV: 84.3 fL (ref 78.0–100.0)
Platelets: 242 10*3/uL (ref 150–400)
RBC: 4.71 MIL/uL (ref 3.87–5.11)
RDW: 12.4 % (ref 11.5–15.5)
WBC: 7.6 10*3/uL (ref 4.0–10.5)

## 2017-08-06 LAB — URINALYSIS, MICROSCOPIC (REFLEX): WBC UA: NONE SEEN WBC/hpf (ref 0–5)

## 2017-08-06 LAB — BASIC METABOLIC PANEL
ANION GAP: 9 (ref 5–15)
BUN: 13 mg/dL (ref 6–20)
CO2: 23 mmol/L (ref 22–32)
Calcium: 9.1 mg/dL (ref 8.9–10.3)
Chloride: 108 mmol/L (ref 101–111)
Creatinine, Ser: 0.7 mg/dL (ref 0.44–1.00)
Glucose, Bld: 81 mg/dL (ref 65–99)
POTASSIUM: 3.4 mmol/L — AB (ref 3.5–5.1)
Sodium: 140 mmol/L (ref 135–145)

## 2017-08-06 LAB — URINALYSIS, ROUTINE W REFLEX MICROSCOPIC
Bilirubin Urine: NEGATIVE
GLUCOSE, UA: NEGATIVE mg/dL
KETONES UR: NEGATIVE mg/dL
LEUKOCYTES UA: NEGATIVE
Nitrite: NEGATIVE
PROTEIN: NEGATIVE mg/dL
Specific Gravity, Urine: 1.015 (ref 1.005–1.030)
pH: 7 (ref 5.0–8.0)

## 2017-08-06 LAB — PREGNANCY, URINE: PREG TEST UR: NEGATIVE

## 2017-08-06 LAB — CBG MONITORING, ED: Glucose-Capillary: 89 mg/dL (ref 65–99)

## 2017-08-06 MED ORDER — DIPHENHYDRAMINE HCL 25 MG PO CAPS: 25.0000 mg | ORAL_CAPSULE | Freq: Once | ORAL | Status: DC

## 2017-08-06 MED ORDER — METOCLOPRAMIDE HCL 5 MG/ML IJ SOLN
10.0000 mg | Freq: Once | INTRAMUSCULAR | Status: AC
  Administered 2017-08-06: 10 mg via INTRAVENOUS
  Filled 2017-08-06: qty 2

## 2017-08-06 MED ORDER — SODIUM CHLORIDE 0.9 % IV BOLUS
1000.0000 mL | Freq: Once | INTRAVENOUS | Status: AC
  Administered 2017-08-06: 1000 mL via INTRAVENOUS

## 2017-08-06 MED ORDER — KETOROLAC TROMETHAMINE 30 MG/ML IJ SOLN
30.0000 mg | Freq: Once | INTRAMUSCULAR | Status: AC
  Administered 2017-08-06: 30 mg via INTRAVENOUS
  Filled 2017-08-06: qty 1

## 2017-08-06 NOTE — Discharge Instructions (Addendum)
Your evaluated in the emergency department for headache associated with neck pain and also dizziness/lightheadedness.  He felt improved with some medication and fluids.  You should continue to stay well-hydrated and follow-up with your regular doctor.  Please return if any worsening symptoms.

## 2017-08-06 NOTE — ED Triage Notes (Signed)
C/o dizziness, HA, nausea x 2 days-NAD-presents to triage in w/c

## 2017-08-06 NOTE — ED Notes (Signed)
Informed Dr. Rayfield CitizenBulter that patient drove herself here and he ordered Benadryl.  EDP stated to hold Benadryl for now.

## 2017-08-06 NOTE — ED Provider Notes (Signed)
[facility] EMERGENCY DEPARTMENT Provider Note   CSN: [id number] Arrival date & time: 08/06/17  1606     History   Chief Complaint Chief Complaint  Patient presents with  . Dizziness    HPI [name] is a 27 y.o. female.  27 year old female complaining of gradual onset of dizziness that she calls lightheadedness seeing spots sometimes and also some room spinning over the course of a couple of days.  It has been associated with some aching pain in her neck radiating up into her head causing a headache.  This was not acute onset.  She denies any fever.  She is under stress with her father being in the hospital.  She does not feel like she is been eating and drinking very well.  She has not tried anything for it.  She states she had a history of vertigo as a child.  Its not associated with any numbness or weakness.  No fevers cough abdominal pain diarrhea or dysuria.  Her last menstrual period is unknown as she is a regular, is on birth control.  The history is provided by the patient.  Dizziness  Quality:  Lightheadedness and room spinning Severity:  Moderate Onset quality:  Gradual Duration:  2 days Timing:  Intermittent Progression:  Waxing and waning Chronicity:  New Context: physical activity and standing up   Relieved by:  Nothing Worsened by:  Nothing Ineffective treatments:  None tried Associated symptoms: headaches and nausea   Associated symptoms: no chest pain, no diarrhea, no hearing loss, no palpitations, no shortness of breath, no syncope, no tinnitus, no vision changes and no vomiting   Headaches:    Severity:  Moderate   Onset quality:  Gradual   Timing:  Constant   Chronicity:  New Risk factors: hx of vertigo     Past Medical History:  Diagnosis Date  . Anxiety   . Bipolar 1 disorder (HCC)   . Costochondritis   . Depression   . History of kidney stones   . PONV (postoperative nausea and vomiting)     Patient Active Problem List   Diagnosis Date Noted  . Bipolar depression (HCC) 06/26/2016  . Bipolar I disorder, most recent episode depressed (HCC)   . Major depressive disorder, 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  . Major depressive disorder, single episode, severe without psychosis (HCC) 06/15/2016  . Normal labor and delivery 11/23/2010    Past Surgical History:  Procedure Laterality Date  . APPENDECTOMY    . CHOLECYSTECTOMY    . LAPAROSCOPY N/A 04/16/2015   Procedure: LAPAROSCOPY DIAGNOSTIC;  Surgeon: Levi AlandMark E Anderson, MD;  Location: [facility];  Service: Gynecology;  Laterality: N/A;     OB History    Gravida  2   Para  2   Term  2   Preterm  0   AB  0   Living  2     SAB  0   TAB  0   Ectopic  0   Multiple  0   Live Births  2            Home Medications    Prior to Admission medications   Medication Sig Start Date End Date Taking? Authorizing Provider  clonazePAM (KLONOPIN) 0.5 MG tablet Take 0.5 mg by mouth 2 (two) times daily. 07/03/16   [provider]  lamoTRIgine (LAMICTAL) 25 MG tablet Take 1 tablet (25 mg total) by mouth 2 (two) times daily. For mood stabilization 07/03/16  Clapacs, Jackquline Denmark, MD  liothyronine (CYTOMEL) 25 MCG tablet Take 1 tablet (25 mcg total) by mouth daily. 07/03/16   Clapacs, Jackquline Denmark, MD  QUEtiapine (SEROQUEL) 400 MG tablet Take 1 tablet (400 mg total) by mouth at bedtime. 07/03/16   Clapacs, Jackquline Denmark, MD  topiramate (TOPAMAX) 50 MG tablet Take 1 tablet (50 mg total) by mouth 2 (two) times daily. 07/03/16   Clapacs, Jackquline Denmark, MD  traZODone (DESYREL) 100 MG tablet Take 1 tablet (100 mg total) by mouth at bedtime as needed for sleep. 07/03/16   Clapacs, Jackquline Denmark, MD  venlafaxine XR (EFFEXOR-XR) 150 MG 24 hr capsule Take 1 capsule (150 mg total) by mouth daily with breakfast. 07/03/16   Clapacs, Jackquline Denmark, MD    Family History Family History  Problem Relation Age of Onset  . Alcohol abuse Father   . Depression Father   . Mental illness  Father   . Drug abuse Father   . Depression Mother   . Birth defects Sister   . Depression Maternal Grandmother   . Heart disease Maternal Grandmother   . Hypertension Maternal Grandmother   . Depression Maternal Grandfather   . Heart disease Maternal Grandfather   . Hypertension Maternal Grandfather   . Depression Paternal Grandmother   . Heart disease Paternal Grandmother   . Hypertension Paternal Grandmother   . Depression Paternal Grandfather   . Heart disease Paternal Grandfather   . Cancer Paternal Grandfather   . Alcohol abuse Paternal Grandfather   . Hypertension Paternal Grandfather     Social History Social History   Tobacco Use  . Smoking status: Never Smoker  . Smokeless tobacco: Never Used  Substance Use Topics  . Alcohol use: Not Currently  . Drug use: No     Allergies   Phenergan [promethazine hcl]   Review of Systems Review of Systems  Constitutional: Negative for chills and fever.  HENT: Negative for ear pain, hearing loss, sore throat and tinnitus.   Eyes: Negative for pain and visual disturbance.  Respiratory: Negative for cough and shortness of breath.   Cardiovascular: Negative for chest pain, palpitations and syncope.  Gastrointestinal: Positive for nausea. Negative for abdominal pain, diarrhea and vomiting.  Genitourinary: Negative for dysuria and hematuria.  Musculoskeletal: Positive for neck pain. Negative for arthralgias and back pain.  Skin: Negative for color change and rash.  Neurological: Positive for dizziness and headaches. Negative for seizures and syncope.  All other systems reviewed and are negative.    Physical Exam Updated Vital Signs BP 107/63 (BP Location: Right Arm)   Pulse 74   Temp 98.3 F (36.8 C) (Oral)   Resp 18   Ht 5\' 4"  (1.626 m)   Wt 63.5 kg (140 lb)   SpO2 100%   BMI 24.03 kg/m   Physical Exam  Constitutional: She is oriented to person, place, and time. She appears well-developed and well-nourished. No  distress.  HENT:  Head: Normocephalic and atraumatic.  Eyes: Pupils are equal, round, and reactive to light. Conjunctivae are normal. Right eye exhibits nystagmus. Right eye exhibits normal extraocular motion. Left eye exhibits nystagmus. Left eye exhibits normal extraocular motion.  Neck: Neck supple.  Cardiovascular: Normal rate and regular rhythm.  No murmur heard. Pulmonary/Chest: Effort normal and breath sounds normal. No respiratory distress.  Abdominal: Soft. There is no tenderness.  Musculoskeletal: She exhibits no edema.  Neurological: She is alert and oriented to person, place, and time. She has normal strength. No cranial nerve deficit  or sensory deficit. GCS eye subscore is 4. GCS verbal subscore is 5. GCS motor subscore is 6.  Skin: Skin is warm and dry.  Psychiatric: She has a normal mood and affect.  Nursing note and vitals reviewed.    ED Treatments / Results  Labs (all labs ordered are listed, but only abnormal results are displayed) Labs Reviewed  URINALYSIS, ROUTINE W REFLEX MICROSCOPIC - Abnormal; Notable for the following components:      Result Value   Hgb urine dipstick TRACE (*)    All other components within normal limits  BASIC METABOLIC PANEL - Abnormal; Notable for the following components:   Potassium 3.4 (*)    All other components within normal limits  URINALYSIS, MICROSCOPIC (REFLEX) - Abnormal; Notable for the following components:   Bacteria, UA RARE (*)    Squamous Epithelial / LPF 0-5 (*)    All other components within normal limits  PREGNANCY, URINE  CBC  CBG MONITORING, ED    EKG EKG Interpretation  Date/Time:  Friday August 06 2017 16:22:01 EDT Ventricular Rate:  84 PR Interval:  120 QRS Duration: 86 QT Interval:  368 QTC Calculation: 434 R Axis:   94 Text Interpretation:  Normal sinus rhythm with sinus arrhythmia Rightward axis Borderline ECG similar to prior 2/18 Confirmed by Meridee Score [phone]) on 08/06/2017 4:28:51  PM   Radiology No results found.  Procedures Procedures (including critical care time)  Medications Ordered in ED Medications  metoCLOPramide (REGLAN) injection 10 mg (has no administration in time range)  ketorolac (TORADOL) 30 MG/ML injection 30 mg (has no administration in time range)  sodium chloride 0.9 % bolus 1,000 mL (has no administration in time range)  diphenhydrAMINE (BENADRYL) capsule 25 mg (has no administration in time range)     Initial Impression / Assessment and Plan / ED Course  I have reviewed the triage vital signs and the nursing notes.  Pertinent labs & imaging results that were available during my care of the patient were reviewed by me and considered in my medical decision making (see chart for details).  Clinical Course as of Aug 08 1750  Fri Aug 06, 2017  6045 Differential diagnosis includes dehydration, viral illness, vertigo, tension headache.  Less likely to be subarachnoid as gradual onset and despite neck pain has full range of motion no meningismus.  Her lab work was unrevealing.  We are giving her a migraine cocktail and some IV fluids and will reassess.   [MB]  [id number] Patient feels improvement in her symptoms and would like to be discharged.   [MB]    Clinical Course User Index [MB] Terrilee Files, MD     Final Clinical Impressions(s) / ED Diagnoses   Final diagnoses:  Dizziness  Dehydration  Acute nonintractable headache, unspecified headache type    ED Discharge Orders    None       Terrilee Files, MD 08/07/17 1752

## 2017-12-17 IMAGING — CR DG CHEST 2V
1 series · 2 of 2 positions shown · non-contrast
Comparison: 11/03/2014.

CLINICAL DATA: 25-year-old female undergoing behavioral medicine
evaluation. Initial encounter.

EXAM:
CHEST  2 VIEW

[Series 1: w chest pa · 0.14mm/px · 2 of 2 slices shown]
[im 1/2]
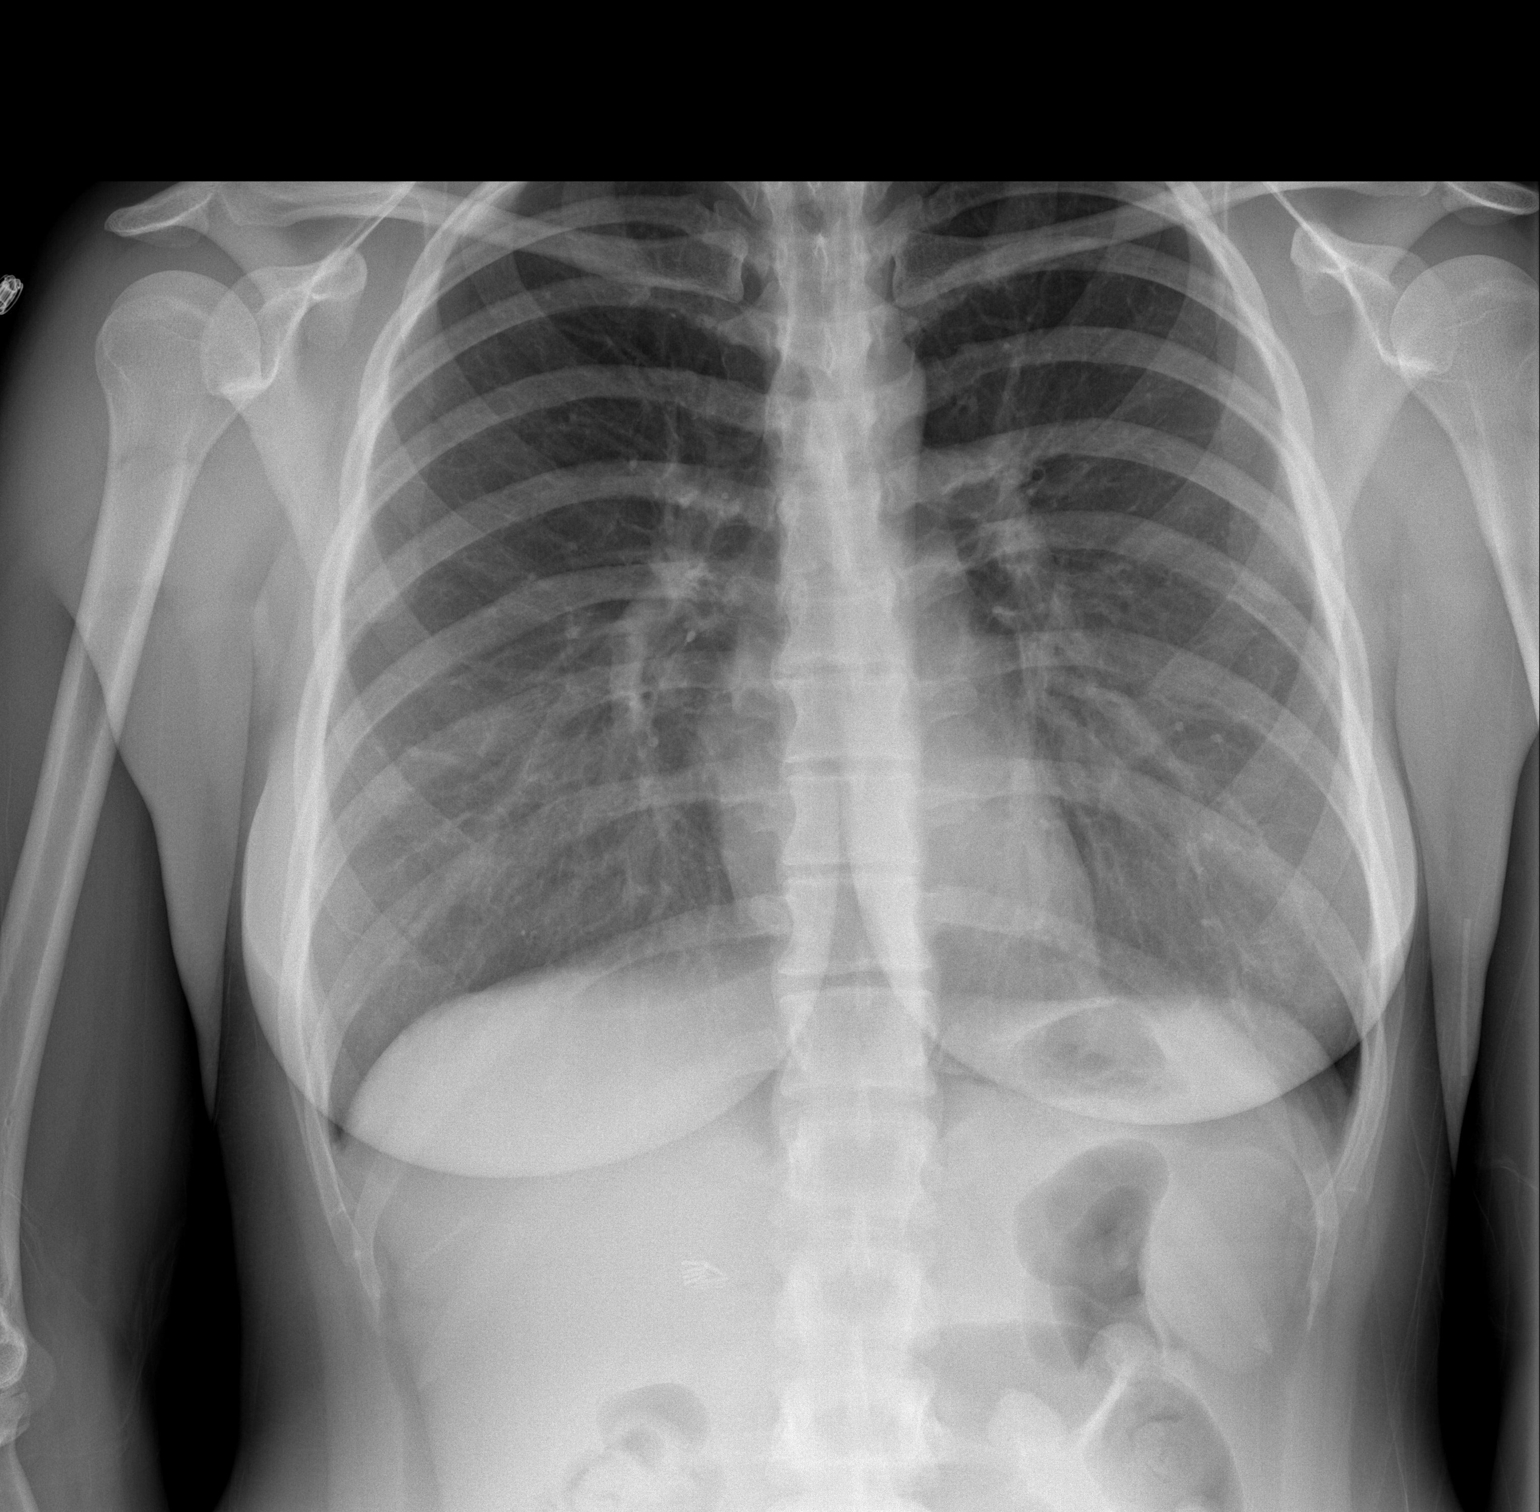
[im 2/2]
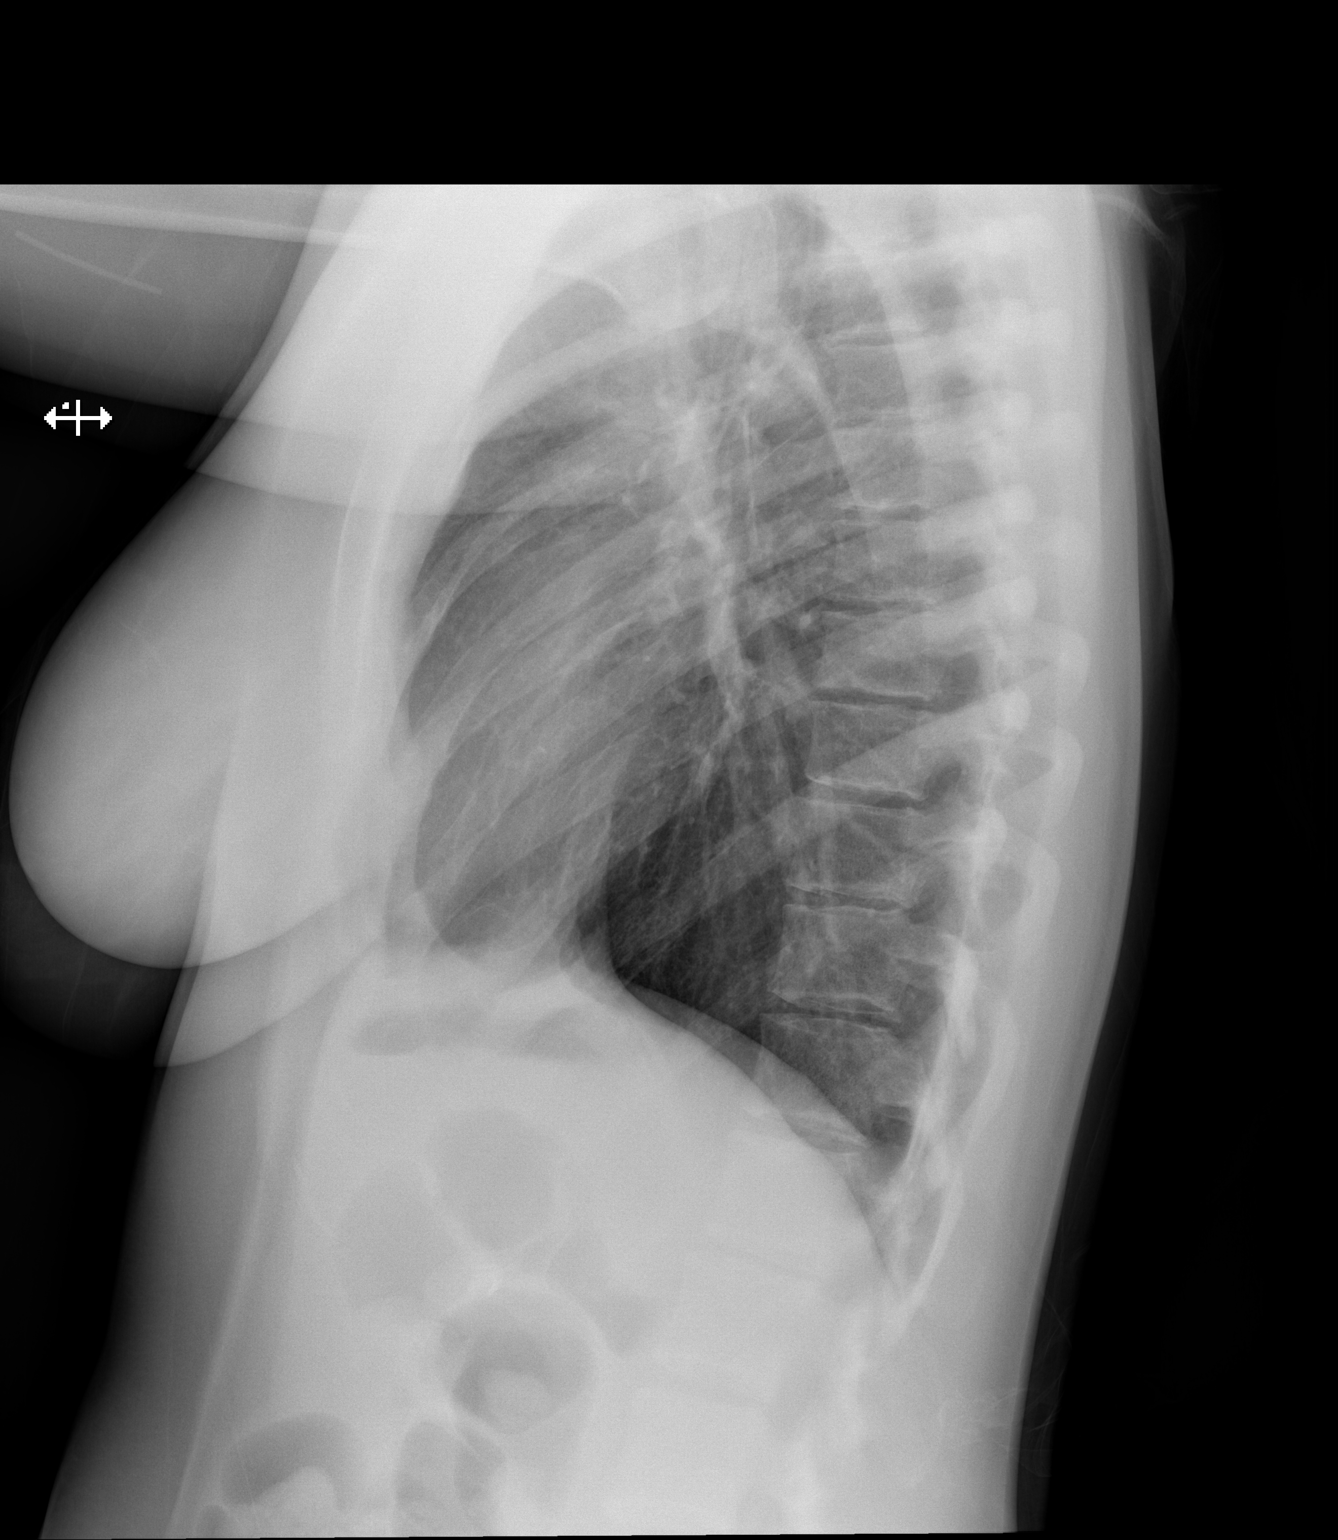

[2 of 2 positions shown; findings below may reference images not displayed]

FINDINGS: Lung volumes are within normal limits. Normal cardiac size and
mediastinal contours. Visualized tracheal air column is within
normal limits. The lungs are clear. No pneumothorax or pleural
effusion. Cholecystectomy clips. Negative visible bowel gas pattern.
No osseous abnormality identified.
IMPRESSION: Negative.  No acute cardiopulmonary abnormality.

## 2018-11-21 ENCOUNTER — Other Ambulatory Visit: Payer: Self-pay

## 2018-11-21 ENCOUNTER — Emergency Department (HOSPITAL_BASED_OUTPATIENT_CLINIC_OR_DEPARTMENT_OTHER)
Admission: EM | Admit: 2018-11-21 | Discharge: 2018-11-22 | Disposition: A | Discharge status: Other Acute Inpatient Hospital | Payer: Medicaid Other | Attending: Emergency Medicine | Admitting: Emergency Medicine

## 2018-11-21 ENCOUNTER — Encounter (HOSPITAL_BASED_OUTPATIENT_CLINIC_OR_DEPARTMENT_OTHER): Payer: Self-pay

## 2018-11-21 DIAGNOSIS — O2342 Unspecified infection of urinary tract in pregnancy, second trimester: Secondary | ICD-10-CM

## 2018-11-21 DIAGNOSIS — O99342 Other mental disorders complicating pregnancy, second trimester: Secondary | ICD-10-CM | POA: Insufficient documentation

## 2018-11-21 DIAGNOSIS — F419 Anxiety disorder, unspecified: Secondary | ICD-10-CM | POA: Diagnosis not present

## 2018-11-21 DIAGNOSIS — F329 Major depressive disorder, single episode, unspecified: Secondary | ICD-10-CM | POA: Diagnosis not present

## 2018-11-21 DIAGNOSIS — Z046 Encounter for general psychiatric examination, requested by authority: Secondary | ICD-10-CM | POA: Diagnosis not present

## 2018-11-21 DIAGNOSIS — R45851 Suicidal ideations: Secondary | ICD-10-CM

## 2018-11-21 DIAGNOSIS — Z9119 Patient's noncompliance with other medical treatment and regimen: Secondary | ICD-10-CM | POA: Insufficient documentation

## 2018-11-21 DIAGNOSIS — Z20828 Contact with and (suspected) exposure to other viral communicable diseases: Secondary | ICD-10-CM | POA: Diagnosis not present

## 2018-11-21 DIAGNOSIS — Z3A14 14 weeks gestation of pregnancy: Secondary | ICD-10-CM | POA: Diagnosis not present

## 2018-11-21 HISTORY — DX: Bipolar disorder, unspecified: F31.9

## 2018-11-21 HISTORY — DX: Endometriosis, unspecified: N80.9

## 2018-11-21 LAB — RAPID URINE DRUG SCREEN, HOSP PERFORMED
Amphetamines: NOT DETECTED
Barbiturates: NOT DETECTED
Benzodiazepines: NOT DETECTED
Cocaine: NOT DETECTED
Opiates: NOT DETECTED
Tetrahydrocannabinol: NOT DETECTED

## 2018-11-21 LAB — CBC
HCT: 36.1 % (ref 36.0–46.0)
Hemoglobin: 12.5 g/dL (ref 12.0–15.0)
MCH: 29.6 pg (ref 26.0–34.0)
MCHC: 34.6 g/dL (ref 30.0–36.0)
MCV: 85.3 fL (ref 80.0–100.0)
Platelets: 240 10*3/uL (ref 150–400)
RBC: 4.23 MIL/uL (ref 3.87–5.11)
RDW: 12.3 % (ref 11.5–15.5)
WBC: 10.3 10*3/uL (ref 4.0–10.5)
nRBC: 0 % (ref 0.0–0.2)

## 2018-11-21 LAB — ACETAMINOPHEN LEVEL: Acetaminophen (Tylenol), Serum: <10 ug/mL — ABNORMAL LOW (ref 10–30)

## 2018-11-21 LAB — COMPREHENSIVE METABOLIC PANEL
ALT: 10 U/L (ref 0–44)
AST: 15 U/L (ref 15–41)
Albumin: 3.8 g/dL (ref 3.5–5.0)
Alkaline Phosphatase: 34 U/L — ABNORMAL LOW (ref 38–126)
Anion gap: 10 (ref 5–15)
BUN: 8 mg/dL (ref 6–20)
CO2: 21 mmol/L — ABNORMAL LOW (ref 22–32)
Calcium: 8.7 mg/dL — ABNORMAL LOW (ref 8.9–10.3)
Chloride: 105 mmol/L (ref 98–111)
Creatinine, Ser: 0.5 mg/dL (ref 0.44–1.00)
GFR calc Af Amer: >60 mL/min (ref >60)
GFR calc non Af Amer: >60 mL/min (ref >60)
Glucose, Bld: 79 mg/dL (ref 70–99)
Potassium: 3.5 mmol/L (ref 3.5–5.1)
Sodium: 136 mmol/L (ref 135–145)
Total Bilirubin: 0.4 mg/dL (ref 0.3–1.2)
Total Protein: 6.7 g/dL (ref 6.5–8.1)

## 2018-11-21 LAB — URINALYSIS, ROUTINE W REFLEX MICROSCOPIC
Bilirubin Urine: NEGATIVE
Glucose, UA: NEGATIVE mg/dL
Ketones, ur: NEGATIVE mg/dL
Nitrite: POSITIVE — AB
Protein, ur: NEGATIVE mg/dL
Specific Gravity, Urine: 1.01 (ref 1.005–1.030)
pH: 6 (ref 5.0–8.0)

## 2018-11-21 LAB — SALICYLATE LEVEL: Salicylate Lvl: <7 mg/dL (ref 2.8–30.0)

## 2018-11-21 LAB — ETHANOL: Alcohol, Ethyl (B): <10 mg/dL (ref <10)

## 2018-11-21 LAB — URINALYSIS, MICROSCOPIC (REFLEX)

## 2018-11-21 LAB — PREGNANCY, URINE: Preg Test, Ur: POSITIVE — AB

## 2018-11-21 MED ORDER — CEPHALEXIN 250 MG PO CAPS
500.0000 mg | ORAL_CAPSULE | Freq: Four times a day (QID) | ORAL | Status: DC
  Administered 2018-11-21 – 2018-11-22 (×3): 500 mg via ORAL
  Filled 2018-11-21 (×3): qty 2

## 2018-11-21 NOTE — ED Notes (Signed)
Foam overlay applied to stretcher and gave patient new warm blankets; patient resting quietly and no other needs voiced at this time.
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
## 2018-11-21 NOTE — ED Notes (Signed)
TTS at bedside. 

## 2018-11-21 NOTE — [facility] Counselor (Signed)
Per Mordecai Maes, NP recommends in patient treatment. TTS to seek placement.

## 2018-11-21 NOTE — ED Triage Notes (Addendum)
Pt c/o increase in depression and SI x 1 month-NAD-steady gait-pt is [redacted] weeks pregnant

## 2018-11-21 NOTE — Progress Notes (Signed)
Pt meets inpatient criteria per Mordecai Maes, NP. Referral information has been sent to the following hospitals for review:  [facility]       Disposition will continue to follow for inpatient placement needs.   Audree Camel, LCSW, [place] Disposition [facility]/TTS [phone]

## 2018-11-21 NOTE — ED Provider Notes (Signed)
[facility] EMERGENCY DEPARTMENT Provider Note   CSN: [id number] Arrival date & time: 11/21/18  1255    History   Chief Complaint Chief Complaint  Patient presents with  . Suicidal    HPI [name] is a 28 y.o. female with a hx of anxiety, depression, & bipolar disorder who presents to the ED w/ complaints of depression & SI for a few months that has been progressively worsening. Patient states she has had a lot going on recently & it has all gotten to be too much. She is depressed, anxious, & is questioning whether or not she should be alive without specific suicide plan to me. She has not had money to afford her medications including her lexapro & lamictal which she feels is aggravating her symptoms. She is currently [redacted] weeks pregnant- she has had confirmed IUP on US, [facility], taking prenatals. She had some urinary odor/cloudiness and saw them last week- concern for UTI- prescription for abx sent but she has been unable to affords this. Denies fever, chills, vomiting, abdominal pain, or vaginal bleeding. Additionally denies HI/hallucinations.      HPI  Past Medical History:  Diagnosis Date  . Anxiety   . Bipolar disorder (HCC)   . Depression   . Endometriosis     There are no active problems to display for this patient.   Past Surgical History:  Procedure Laterality Date  . APPENDECTOMY    . CHOLECYSTECTOMY       OB History    Gravida  1   Para      Term      Preterm      AB      Living        SAB      TAB      Ectopic      Multiple      Live Births               Home Medications    Prior to Admission medications   Not on File    Family History No family history on file.  Social History Social History   Tobacco Use  . Smoking status: Never Smoker  . Smokeless tobacco: Never Used  Substance Use Topics  . Alcohol use: Never    Frequency: Never  . Drug use: Never     Allergies    Promethazine   Review of Systems Review of Systems  Constitutional: Negative for chills and fever.  Respiratory: Negative for shortness of breath.   Cardiovascular: Negative for chest pain.  Gastrointestinal: Negative for abdominal pain, blood in stool, constipation, diarrhea and vomiting.  Genitourinary: Negative for dysuria, pelvic pain and vaginal bleeding.       + for cloudy/malodorous urine.   Psychiatric/Behavioral: Positive for suicidal ideas. Negative for hallucinations. The patient is nervous/anxious.   All other systems reviewed and are negative.    Physical Exam Updated Vital Signs BP 113/76 (BP Location: Left Arm)   Pulse 86   Temp 98.2 F (36.8 C) (Oral)   Resp 16   Ht 5\' 3"  (1.6 m)   Wt 61.2 kg   SpO2 100%   BMI 23.91 kg/m   Physical Exam Vitals signs and nursing note reviewed.  Constitutional:      General: She is not in acute distress.    Appearance: She is well-developed. She is not toxic-appearing.  HENT:     Head: Normocephalic and atraumatic.  Eyes:  General:        Right eye: No discharge.        Left eye: No discharge.     Conjunctiva/sclera: Conjunctivae normal.  Neck:     Musculoskeletal: Neck supple.  Cardiovascular:     Rate and Rhythm: Normal rate and regular rhythm.  Pulmonary:     Effort: Pulmonary effort is normal. No respiratory distress.     Breath sounds: Normal breath sounds. No wheezing, rhonchi or rales.  Abdominal:     General: There is no distension.     Palpations: Abdomen is soft.     Tenderness: There is no abdominal tenderness. There is no right CVA tenderness, left CVA tenderness, guarding or rebound.  Skin:    General: Skin is warm and dry.     Findings: No rash.  Neurological:     Mental Status: She is alert.     Comments: Clear speech.   Psychiatric:        Behavior: Behavior is cooperative.        Thought Content: Thought content does not include homicidal or suicidal plan.     Comments: Tearful.     ED  Treatments / Results  Labs (all labs ordered are listed, but only abnormal results are displayed) Labs Reviewed  COMPREHENSIVE METABOLIC PANEL - Abnormal; Notable for the following components:      Result Value   CO2 21 (*)    Calcium 8.7 (*)    Alkaline Phosphatase 34 (*)    All other components within normal limits  ACETAMINOPHEN LEVEL - Abnormal; Notable for the following components:   Acetaminophen (Tylenol), Serum <10 (*)    All other components within normal limits  ETHANOL  SALICYLATE LEVEL  CBC  RAPID URINE DRUG SCREEN, HOSP PERFORMED  PREGNANCY, URINE  URINALYSIS, ROUTINE W REFLEX MICROSCOPIC    EKG None  Radiology No results found.  Procedures Procedures (including critical care time)  Medications Ordered in ED Medications  cephALEXin (KEFLEX) capsule 500 mg (500 mg Oral Given 11/21/18 1742)     Initial Impression / Assessment and Plan / ED Course  I have reviewed the triage vital signs and the nursing notes.  Pertinent labs & imaging results that were available during my care of the patient were reviewed by me and considered in my medical decision making (see chart for details).   Patient presents with depression, anxiety & suicidal thoughts.  She is currently [redacted] weeks pregnant, reports known IUP. No abdominal pain or vaginal bleeding. Mentions UTI per OBGYN, has not filled abx prescription (She thinks it may have been for keflex)- will recheck urine.  Labs overall reassuring.  UA consistent w/ UTI- sent for culture.  Will tx w/ Keflex QID for 7 days per discussion w/ Dr. Rogene Houston.   Patient medically cleared for TTS assessment.  Disposition per [facility].   Patient meets inpatient criteria by [facility], pending placement.  Holding orders placed. Started on Keflex 500 mg QID for 7 days which will need to be continued during inpatient psych stay.     Final Clinical Impressions(s) / ED Diagnoses   Final diagnoses:  Suicidal ideation  Urinary tract infection in  mother during second trimester of pregnancy    ED Discharge Orders    None       Amaryllis Dyke, PA-C 11/21/18 1801    Fredia Sorrow, MD 11/22/18 1626

## 2018-11-22 ENCOUNTER — Inpatient Hospital Stay (HOSPITAL_COMMUNITY)
Admission: AD | Admit: 2018-11-22 | Discharge: 2018-11-28 | DRG: 832 | Disposition: A | Discharge status: Home / Self Care | Payer: Medicaid Other | Source: Intra-hospital | Attending: Psychiatry | Admitting: Psychiatry

## 2018-11-22 ENCOUNTER — Other Ambulatory Visit: Payer: Self-pay

## 2018-11-22 ENCOUNTER — Other Ambulatory Visit: Payer: Self-pay | Admitting: Behavioral Health

## 2018-11-22 ENCOUNTER — Encounter (HOSPITAL_COMMUNITY): Payer: Self-pay | Admitting: *Deleted

## 2018-11-22 DIAGNOSIS — O99341 Other mental disorders complicating pregnancy, first trimester: Secondary | ICD-10-CM | POA: Diagnosis not present

## 2018-11-22 DIAGNOSIS — Z3A00 Weeks of gestation of pregnancy not specified: Secondary | ICD-10-CM | POA: Diagnosis not present

## 2018-11-22 DIAGNOSIS — R45851 Suicidal ideations: Secondary | ICD-10-CM | POA: Diagnosis not present

## 2018-11-22 DIAGNOSIS — F3130 Bipolar disorder, current episode depressed, mild or moderate severity, unspecified: Secondary | ICD-10-CM | POA: Diagnosis not present

## 2018-11-22 DIAGNOSIS — G4700 Insomnia, unspecified: Secondary | ICD-10-CM | POA: Diagnosis not present

## 2018-11-22 DIAGNOSIS — F4310 Post-traumatic stress disorder, unspecified: Secondary | ICD-10-CM | POA: Diagnosis present

## 2018-11-22 DIAGNOSIS — O2341 Unspecified infection of urinary tract in pregnancy, first trimester: Secondary | ICD-10-CM | POA: Diagnosis not present

## 2018-11-22 DIAGNOSIS — F319 Bipolar disorder, unspecified: Secondary | ICD-10-CM | POA: Diagnosis present

## 2018-11-22 DIAGNOSIS — O99342 Other mental disorders complicating pregnancy, second trimester: Secondary | ICD-10-CM | POA: Diagnosis not present

## 2018-11-22 LAB — SARS CORONAVIRUS 2 AG (30 MIN TAT): SARS Coronavirus 2 Ag: NEGATIVE

## 2018-11-22 MED ORDER — ACETAMINOPHEN 325 MG PO TABS
650.0000 mg | ORAL_TABLET | Freq: Four times a day (QID) | ORAL | Status: DC | PRN
  Administered 2018-11-22 – 2018-11-27 (×3): 650 mg via ORAL
  Filled 2018-11-22 (×3): qty 2

## 2018-11-22 MED ORDER — ALUM & MAG HYDROXIDE-SIMETH 200-200-20 MG/5ML PO SUSP: 30.0000 mL | ORAL | Status: DC | PRN

## 2018-11-22 NOTE — Progress Notes (Signed)
Pt accepted to [facility], Bed 403-1 Mordecai Maes, NP is the accepting provider.  Myles Lipps, MD is the attending provider.  Call report to [phone]  [facility] ED notified.   Pt is Voluntary.  Pt may be transported by [facility]  Pt scheduled  to arrive at [facility] @ 16:00  Romie Minus T. Judi Cong, MSW, [place] Disposition Clinical Social Work [phone] (cell) [phone] (office)

## 2018-11-22 NOTE — ED Notes (Signed)
Pt verbalizes need to contact new employer, however she does not have contact info or anyone that can contact them for her. 

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
## 2018-11-22 NOTE — Progress Notes (Signed)
The patient's positive event for the day is that she was happy that she made it to [facility]. Her goal for tomorrow is to "start over" with regards to several issues.

## 2018-11-22 NOTE — ED Notes (Signed)
Pt informed of room availability. Pt calm, watching television, vitals updated

## 2018-11-22 NOTE — Plan of Care (Signed)
Nurse discussed anxiety, depression, coping skills with patient. 

## 2018-11-22 NOTE — Tx Team (Signed)
Initial Treatment Plan 11/22/2018 6:31 PM Arville Lime EPP:[id number]    PATIENT STRESSORS: Educational concerns Financial difficulties Marital or family conflict Medication change or noncompliance Occupational concerns Substance abuse   PATIENT STRENGTHS: Ability for insight Average or above average intelligence Capable of independent living Dispensing optician for treatment/growth Supportive family/friends   PATIENT IDENTIFIED PROBLEMS: "suicidal thoughts"  "substance abuse"  "anxiety"  "depression"               DISCHARGE CRITERIA:  Ability to meet basic life and health needs Adequate post-discharge living arrangements Improved stabilization in mood, thinking, and/or behavior Medical problems require only outpatient monitoring Motivation to continue treatment in a less acute level of care Need for constant or close observation no longer present Reduction of life-threatening or endangering symptoms to within safe limits Safe-care adequate arrangements made Verbal commitment to aftercare and medication compliance Withdrawal symptoms are absent or subacute and managed without 24-hour nursing intervention  PRELIMINARY DISCHARGE PLAN: Attend aftercare/continuing care group Attend PHP/IOP Attend 12-step recovery group Outpatient therapy Participate in family therapy Placement in alternative living arrangements  PATIENT/FAMILY INVOLVEMENT: This treatment plan has been presented to and reviewed with the patient, [name].  .  The patient and family have been given the opportunity to ask questions and make suggestions.  Cammy Copa, [place] 11/22/2018, 6:31 PM

## 2018-11-22 NOTE — ED Notes (Signed)
[facility] notified to pick up patient for transport to [facility] at 1530 today. Spoke with Edd Arbour, she states driver will be notified.

## 2018-11-23 DIAGNOSIS — F319 Bipolar disorder, unspecified: Secondary | ICD-10-CM

## 2018-11-23 LAB — TSH: TSH: 0.946 u[IU]/mL (ref 0.350–4.500)

## 2018-11-23 LAB — LIPID PANEL
Cholesterol: 141 mg/dL (ref 0–200)
HDL: 43 mg/dL (ref >40)
LDL Cholesterol: 78 mg/dL (ref 0–99)
Total CHOL/HDL Ratio: 3.3 RATIO
Triglycerides: 99 mg/dL (ref <150)
VLDL: 20 mg/dL (ref 0–40)

## 2018-11-23 LAB — URINALYSIS, COMPLETE (UACMP) WITH MICROSCOPIC
Bilirubin Urine: NEGATIVE
Glucose, UA: NEGATIVE mg/dL
Hgb urine dipstick: NEGATIVE
Ketones, ur: NEGATIVE mg/dL
Leukocytes,Ua: NEGATIVE
Nitrite: NEGATIVE
Protein, ur: NEGATIVE mg/dL
Specific Gravity, Urine: 1.002 — ABNORMAL LOW (ref 1.005–1.030)
pH: 6 (ref 5.0–8.0)

## 2018-11-23 LAB — HEMOGLOBIN A1C
Hgb A1c MFr Bld: 4.2 % — ABNORMAL LOW (ref 4.8–5.6)
Mean Plasma Glucose: 73.84 mg/dL

## 2018-11-23 LAB — URINE CULTURE: Culture: >=100000 — AB

## 2018-11-23 MED ORDER — SERTRALINE HCL 25 MG PO TABS
25.0000 mg | ORAL_TABLET | Freq: Every day | ORAL | Status: DC
  Administered 2018-11-23 – 2018-11-24 (×2): 25 mg via ORAL
  Filled 2018-11-23 (×4): qty 1

## 2018-11-23 MED ORDER — ONDANSETRON HCL 4 MG PO TABS
4.0000 mg | ORAL_TABLET | Freq: Three times a day (TID) | ORAL | Status: DC | PRN
  Administered 2018-11-24: 4 mg via ORAL
  Filled 2018-11-23: qty 1

## 2018-11-23 MED ORDER — HYDROXYZINE HCL 25 MG PO TABS
25.0000 mg | ORAL_TABLET | ORAL | Status: DC | PRN
  Filled 2018-11-23: qty 1
  Filled 2018-11-23: qty 3

## 2018-11-23 MED ORDER — LURASIDONE HCL 40 MG PO TABS
40.0000 mg | ORAL_TABLET | Freq: Every day | ORAL | Status: DC
  Administered 2018-11-23: 40 mg via ORAL
  Filled 2018-11-23 (×2): qty 1

## 2018-11-23 NOTE — Progress Notes (Signed)
[place] NOVEL CORONAVIRUS (COVID-19) DAILY CHECK-OFF SYMPTOMS - answer yes or no to each - every day NO YES  Have you had a fever in the past 24 hours?  . Fever (Temp > 37.80C / 100F) X   Have you had any of these symptoms in the past 24 hours? . New Cough .  Sore Throat  .  Shortness of Breath .  Difficulty Breathing .  Unexplained Body Aches   X   Have you had any one of these symptoms in the past 24 hours not related to allergies?   . Runny Nose .  Nasal Congestion .  Sneezing   X   If you have had runny nose, nasal congestion, sneezing in the past 24 hours, has it worsened?  X   EXPOSURES - check yes or no X   Have you traveled outside the state in the past 14 days?  X   Have you been in contact with someone with a confirmed diagnosis of COVID-19 or PUI in the past 14 days without wearing appropriate PPE?  X   Have you been living in the same home as a person with confirmed diagnosis of COVID-19 or a PUI (household contact)?    X   Have you been diagnosed with COVID-19?    X              What to do next: Answered NO to all: Answered YES to anything:   Proceed with unit schedule Follow the [facility] Inpatient Flowsheet.   

## 2018-11-23 NOTE — Tx Team (Signed)
Interdisciplinary Treatment and Diagnostic Plan Update  11/23/2018 Time of Session:  [name] MRN: [id number]  Principal Diagnosis: <principal problem not specified>  Secondary Diagnoses: Active Problems:   Bipolar 1 disorder (HCC)   Current Medications:  Current Facility-Administered Medications  Medication Dose Route Frequency Provider Last Rate Last Dose  . acetaminophen (TYLENOL) tablet 650 mg  650 mg Oral Q6H PRN Mordecai Maes, NP   650 mg at 11/22/18 2155  . alum & mag hydroxide-simeth (MAALOX/MYLANTA) 200-200-20 MG/5ML suspension 30 mL  30 mL Oral Q4H PRN Mordecai Maes, NP      . hydrOXYzine (ATARAX/VISTARIL) tablet 25 mg  25 mg Oral Q4H PRN Sharma Covert, MD      . lurasidone (LATUDA) tablet 40 mg  40 mg Oral Q supper Sharma Covert, MD      . ondansetron [facility]) tablet 4 mg  4 mg Oral Q8H PRN Sharma Covert, MD      . sertraline (ZOLOFT) tablet 25 mg  25 mg Oral Daily Sharma Covert, MD       PTA Medications: Medications Prior to Admission  Medication Sig Dispense Refill Last Dose  . Prenatal Multivit-Min-Fe-FA (PRE-NATAL FORMULA) TABS Take by mouth.   Past Month at Unknown time    Patient Stressors: Educational concerns Financial difficulties Marital or family conflict Medication change or noncompliance Occupational concerns Substance abuse  Patient Strengths: Ability for insight Average or above average intelligence Capable of independent living Dispensing optician for treatment/growth Supportive family/friends  Treatment Modalities: Medication Management, Group therapy, Case management,  1 to 1 session with clinician, Psychoeducation, Recreational therapy.   Physician Treatment Plan for Primary Diagnosis: <principal problem not specified> Long Term Goal(s):     Short Term Goals:    Medication Management: Evaluate patient's response, side effects, and tolerance of medication  regimen.  Therapeutic Interventions: 1 to 1 sessions, Unit Group sessions and Medication administration.  Evaluation of Outcomes: Not Met  Physician Treatment Plan for Secondary Diagnosis: Active Problems:   Bipolar 1 disorder ([place])  Long Term Goal(s):     Short Term Goals:       Medication Management: Evaluate patient's response, side effects, and tolerance of medication regimen.  Therapeutic Interventions: 1 to 1 sessions, Unit Group sessions and Medication administration.  Evaluation of Outcomes: Not Met   RN Treatment Plan for Primary Diagnosis: <principal problem not specified> Long Term Goal(s): Knowledge of disease and therapeutic regimen to maintain health will improve  Short Term Goals: Ability to participate in decision making will improve, Ability to verbalize feelings will improve, Ability to disclose and discuss suicidal ideas, Ability to identify and develop effective coping behaviors will improve and Compliance with prescribed medications will improve  Medication Management: RN will administer medications as ordered by provider, will assess and evaluate patient's response and provide education to patient for prescribed medication. RN will report any adverse and/or side effects to prescribing provider.  Therapeutic Interventions: 1 on 1 counseling sessions, Psychoeducation, Medication administration, Evaluate responses to treatment, Monitor vital signs and CBGs as ordered, Perform/monitor CIWA, COWS, AIMS and Fall Risk screenings as ordered, Perform wound care treatments as ordered.  Evaluation of Outcomes: Not Met   LCSW Treatment Plan for Primary Diagnosis: <principal problem not specified> Long Term Goal(s): Safe transition to appropriate next level of care at discharge, Engage patient in therapeutic group addressing interpersonal concerns.  Short Term Goals: Engage patient in aftercare planning with referrals and resources  Therapeutic Interventions: Assess for  all  discharge needs, 1 to 1 time with Education officer, museum, Explore available resources and support systems, Assess for adequacy in community support network, Educate family and significant other(s) on suicide prevention, Complete Psychosocial Assessment, Interpersonal group therapy.  Evaluation of Outcomes: Not Met   Progress in Treatment: Attending groups: No. New to unit Participating in groups: No. Taking medication as prescribed: Yes. Toleration medication: Yes. Family/Significant other contact made: No, will contact:  if patient consents to collateral contacts, CSW will continue to assess Patient understands diagnosis: Yes. Discussing patient identified problems/goals with staff: Yes. Medical problems stabilized or resolved: Yes. Denies suicidal/homicidal ideation: No. Issues/concerns per patient self-inventory: No. Other:   New problem(s) identified: None   New Short Term/Long Term Goal(s): medication stabilization, elimination of SI thoughts, development of comprehensive mental wellness plan.    Patient Goals:    Discharge Plan or Barriers: Patient recently admitted. CSW will continue to follow and assess for appropriate referrals and possible discharge planning.    Reason for Continuation of Hospitalization: Anxiety Depression Medication stabilization Suicidal ideation  Estimated Length of Stay: 3-5 days   Attendees: Patient: 11/23/2018 8:42 AM  Physician: Dr. Myles Lipps, MD 11/23/2018 8:42 AM  Nursing: Selinda Eon.Jerilynn Mages RN 11/23/2018 8:42 AM  RN Care Manager: 11/23/2018 8:42 AM  Social Worker: Radonna Ricker, [place] 11/23/2018 8:42 AM  Recreational Therapist:  11/23/2018 8:42 AM  Other:  11/23/2018 8:42 AM  Other:  11/23/2018 8:42 AM  Other: 11/23/2018 8:42 AM    Scribe for Treatment Team: Marylee Floras, [place] 11/23/2018 8:42 AM
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
## 2018-11-23 NOTE — Progress Notes (Signed)
Patient ID: [name], female   DOB: May 08, 1991, 28 y.o.   MRN: [id number] [place] NOVEL CORONAVIRUS (COVID-19) DAILY CHECK-OFF SYMPTOMS - answer yes or no to each - every day NO YES  Have you had a fever in the past 24 hours?  . Fever (Temp > 37.80C / 100F) X   Have you had any of these symptoms in the past 24 hours? . New Cough .  Sore Throat  .  Shortness of Breath .  Difficulty Breathing .  Unexplained Body Aches   X   Have you had any one of these symptoms in the past 24 hours not related to allergies?   . Runny Nose .  Nasal Congestion .  Sneezing   X   If you have had runny nose, nasal congestion, sneezing in the past 24 hours, has it worsened?  X   EXPOSURES - check yes or no X   Have you traveled outside the state in the past 14 days?  X   Have you been in contact with someone with a confirmed diagnosis of COVID-19 or PUI in the past 14 days without wearing appropriate PPE?  X   Have you been living in the same home as a person with confirmed diagnosis of COVID-19 or a PUI (household contact)?    X   Have you been diagnosed with COVID-19?    X              What to do next: Answered NO to all: Answered YES to anything:   Proceed with unit schedule Follow the [facility] Inpatient Flowsheet.
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
## 2018-11-23 NOTE — BHH Group Notes (Signed)
Occupational Therapy Group Note  Date:  11/23/2018 Time:  10:39 AM  Group Topic/Focus:  Self Esteem  Participation Level:  Active  Participation Quality:  Appropriate  Affect:  Depressed and Flat  Cognitive:  Appropriate  Insight: Improving  Engagement in Group:  Engaged  Modes of Intervention:  Activity, Discussion, Education and Socialization  Additional Comments:   S: "My self esteem decreases when people start saying that I am crazy or assume that my MH condition is something other than what it is"  O: OT tx with focus on self esteem building this date. Education given on definition of self esteem, with both causes of low and high self esteem identified. Activity given for pt to identify a positive/aspiring trait for each letter of the alphabet. Pt to work with peers to help complete activity and build positive thinking.   A: Pt presents with flat affect, engaged and participatory throughout session. Pt shares that her self esteem is decreased when people make false assumptions regarding her MH condition. She shares that self care and positive people increase her self esteem. A-Z activity completed without VC's.  P: OT group will be x1 per week while pt inpatient.    Zenovia Jarred, MSOT, OTR/L Behavioral Health OT/ Acute Relief OT PHP Office: [place] 11/23/2018, 10:39 AM

## 2018-11-23 NOTE — Progress Notes (Signed)
Pt currently asleep in bed. Respiration are even and unlabored. Pt in no sign of distress. Will continue to monitor.    [place] NOVEL CORONAVIRUS (COVID-19) DAILY CHECK-OFF SYMPTOMS - answer yes or no to each - every day NO YES  Have you had a fever in the past 24 hours?  . Fever (Temp > 37.80C / 100F) X   Have you had any of these symptoms in the past 24 hours? . New Cough .  Sore Throat  .  Shortness of Breath .  Difficulty Breathing .  Unexplained Body Aches   X   Have you had any one of these symptoms in the past 24 hours not related to allergies?   . Runny Nose .  Nasal Congestion .  Sneezing   X   If you have had runny nose, nasal congestion, sneezing in the past 24 hours, has it worsened?  X   EXPOSURES - check yes or no X   Have you traveled outside the state in the past 14 days?  X   Have you been in contact with someone with a confirmed diagnosis of COVID-19 or PUI in the past 14 days without wearing appropriate PPE?  X   Have you been living in the same home as a person with confirmed diagnosis of COVID-19 or a PUI (household contact)?    X   Have you been diagnosed with COVID-19?    X              What to do next: Answered NO to all: Answered YES to anything:   Proceed with unit schedule Follow the [facility] Inpatient Flowsheet.    

## 2018-11-23 NOTE — Progress Notes (Signed)
Patient ID: [name], female   DOB: 11/16/1990, 28 y.o.   MRN: [id number] D) Pt has been labile, anxious in mood. Affect constricted or depressed. Pt is cooperative on approach and active in the milieu. Pt rates her depression a 10/10 with anxiety 8/10 and hopelessness also a 10/10. Pt contracts for safety. Pt c/o decreased sleep, appetite, and energy level low. Pt approached Probation officer c/o that the Zoloft was making her anxious and asking if she could talk to the doctor about restarting her Lamictal and Prozac. Pt has taken Prozac during pregnancy before she says. A) Level 3 obs for safety. Support and encouragement provided. Med ed reinforced. R) Cooperative.

## 2018-11-24 MED ORDER — CEPHALEXIN 500 MG PO CAPS
500.0000 mg | ORAL_CAPSULE | Freq: Two times a day (BID) | ORAL | Status: DC
  Administered 2018-11-24 – 2018-11-28 (×9): 500 mg via ORAL
  Filled 2018-11-24: qty 2
  Filled 2018-11-24 (×6): qty 1
  Filled 2018-11-24: qty 2
  Filled 2018-11-24 (×2): qty 1
  Filled 2018-11-24: qty 2
  Filled 2018-11-24 (×5): qty 1

## 2018-11-24 MED ORDER — QUETIAPINE FUMARATE 25 MG PO TABS
25.0000 mg | ORAL_TABLET | Freq: Every day | ORAL | Status: DC
  Administered 2018-11-24 – 2018-11-27 (×4): 25 mg via ORAL
  Filled 2018-11-24 (×6): qty 1

## 2018-11-24 MED ORDER — LAMOTRIGINE 25 MG PO TABS
25.0000 mg | ORAL_TABLET | Freq: Every day | ORAL | Status: DC
  Administered 2018-11-24 – 2018-11-26 (×3): 25 mg via ORAL
  Filled 2018-11-24 (×4): qty 1

## 2018-11-24 MED ORDER — FLUOXETINE HCL 10 MG PO CAPS
10.0000 mg | ORAL_CAPSULE | Freq: Every day | ORAL | Status: DC
  Administered 2018-11-24 – 2018-11-26 (×3): 10 mg via ORAL
  Filled 2018-11-24 (×4): qty 1

## 2018-11-24 NOTE — Progress Notes (Signed)
Patient states that what she learned today is how to play cards with her peers. She also stated that her medications were changed to her liking. Her goal for tomorrow is to work on her discharge plans.

## 2018-11-24 NOTE — Progress Notes (Signed)
Adult Psychoeducational Group Note  Date:  11/24/2018 Time:  12:56 PM  Group Topic/Focus:  Goals Group:   The focus of this group is to help patients establish daily goals to achieve during treatment and discuss how the patient can incorporate goal setting into their daily lives to aide in recovery.  Participation Level:  Active  Participation Quality:  Appropriate  Affect:  Appropriate  Cognitive:  Alert  Insight: Good  Engagement in Group:  Engaged  Modes of Intervention:  Discussion  Additional Comments:  Pt attended group and participated in discussion.  [name] R Deetta Siegmann 11/24/2018, 12:56 PM

## 2018-11-24 NOTE — Plan of Care (Signed)
Nurse discussed anxiety, depression and coping skills with patient.  

## 2018-11-24 NOTE — Progress Notes (Signed)
Pt observe interacting with peers in the dayroom. Pt attended wrap-up group this evening. Pt appears animated/anxious on approach. She denies SI/HI/AVH at this time. Pt requesting to speak with a Education officer, museum in the a.m. Pt states concerns of obtaining meds without insurance. Encouragement provided. Safety maintained.

## 2018-11-24 NOTE — Progress Notes (Signed)
[facility] MD Progress Note  11/24/2018 11:03 AM [name]  MRN:  [id number] Subjective: Patient is seen and examined.  Patient is a 28 year old female with a past psychiatric history significant for bipolar disorder who was admitted on 11/23/2018 with suicidal ideation.  Objective: Patient is a 28 year old female with the above-stated past psychiatric history who is seen in follow-up.  She stated that she became significantly nauseated last night.  Initially she thought it was due to the Zoloft, but now believes it was due to the [place].  She also stated that she had previously been on Lamictal, but that it been 2 months ago.  She states she stopped it after her insurance ran out.  She also stated that she had been previously on Prozac and seemed to tolerate that well.  She stated that she has always been treated with an antipsychotic and a mood stabilizer during her entire course of bipolar disorder treatment.  She stated she had previously been on Seroquel, and it worked well.  She was unclear as to why they had stopped it except for the fact she had gained 40 to 50 pounds on it.  She stated she had been taking somewhere between 200 mg and 400 mg at bedtime.  She is still very anxious and worried about the court case.  She is also very concerned about being able to continue to have her Medicaid during the pregnancy.  Her blood pressures a bit low this morning at 89/51.  She is asymptomatic.  She is tachycardic at 133.  Earlier this a.m. her heart rate was 86.  Principal Problem: <principal problem not specified> Diagnosis: Active Problems:   Bipolar 1 disorder (HCC)  Total Time spent with patient: 20 minutes  Past Psychiatric History: See admission H&P  Past Medical History:  Past Medical History:  Diagnosis Date  . Anxiety   . Bipolar disorder ([place])   . Depression   . Endometriosis     Past Surgical History:  Procedure Laterality Date  . APPENDECTOMY    . CHOLECYSTECTOMY     Family  History: History reviewed. No pertinent family history. Family Psychiatric  History: See admission H&P Social History:  Social History   Substance and Sexual Activity  Alcohol Use Never  . Frequency: Never     Social History   Substance and Sexual Activity  Drug Use Never    Social History   Socioeconomic History  . Marital status: Single    Spouse name: Not on file  . Number of children: Not on file  . Years of education: Not on file  . Highest education level: Not on file  Occupational History  . Not on file  Social Needs  . Financial resource strain: Not on file  . Food insecurity    Worry: Not on file    Inability: Not on file  . Transportation needs    Medical: Not on file    Non-medical: Not on file  Tobacco Use  . Smoking status: Never Smoker  . Smokeless tobacco: Never Used  Substance and Sexual Activity  . Alcohol use: Never    Frequency: Never  . Drug use: Never  . Sexual activity: Not on file  Lifestyle  . Physical activity    Days per week: Not on file    Minutes per session: Not on file  . Stress: Not on file  Relationships  . Social Herbalist on phone: Not on file    Gets together: Not  on file    Attends religious service: Not on file    Active member of club or organization: Not on file    Attends meetings of clubs or organizations: Not on file    Relationship status: Not on file  Other Topics Concern  . Not on file  Social History Narrative  . Not on file   Additional Social History:                         Sleep: Fair  Appetite:  Poor  Current Medications: Current Facility-Administered Medications  Medication Dose Route Frequency Provider Last Rate Last Dose  . acetaminophen (TYLENOL) tablet 650 mg  650 mg Oral Q6H PRN Denzil Magnusonhomas, Lashunda, NP   650 mg at 11/22/18 2155  . alum & mag hydroxide-simeth (MAALOX/MYLANTA) 200-200-20 MG/5ML suspension 30 mL  30 mL Oral Q4H PRN Denzil Magnusonhomas, Lashunda, NP      . cephALEXin  (KEFLEX) capsule 500 mg  500 mg Oral Q12H Antonieta Pertlary, Nasia Cannan Lawson, MD      . FLUoxetine (PROZAC) capsule 10 mg  10 mg Oral Daily Antonieta Pertlary, Kiyaan Haq Lawson, MD      . hydrOXYzine (ATARAX/VISTARIL) tablet 25 mg  25 mg Oral Q4H PRN Antonieta Pertlary, Kharter Sestak Lawson, MD      . lamoTRIgine (LAMICTAL) tablet 25 mg  25 mg Oral Daily Antonieta Pertlary, Elston Aldape Lawson, MD      . ondansetron [facility](ZOFRAN) tablet 4 mg  4 mg Oral Q8H PRN Antonieta Pertlary, Sebastion Jun Lawson, MD   4 mg at 11/24/18 0829  . QUEtiapine (SEROQUEL) tablet 25 mg  25 mg Oral QHS Antonieta Pertlary, Osa Fogarty Lawson, MD        Lab Results:  Results for orders placed or performed during the hospital encounter of 11/22/18 (from the past 48 hour(s))  TSH     Status: None   Collection Time: 11/23/18  6:37 AM  Result Value Ref Range   TSH 0.946 0.350 - 4.500 uIU/mL    Comment: Performed by a 3rd Generation assay with a functional sensitivity of <=0.01 uIU/mL. Performed at [facility], [address] [place], [place] [address]   Hemoglobin A1c     Status: Abnormal   Collection Time: 11/23/18  6:38 AM  Result Value Ref Range   Hgb A1c MFr Bld 4.2 (L) 4.8 - 5.6 %    Comment: (NOTE) Pre diabetes:          5.7%-6.4% Diabetes:              >6.4% Glycemic control for   <7.0% adults with diabetes    Mean Plasma Glucose 73.84 mg/dL    Comment: Performed at [facility], [address] [place], [place] [address]  Lipid panel     Status: None   Collection Time: 11/23/18  6:38 AM  Result Value Ref Range   Cholesterol 141 0 - 200 mg/dL   Triglycerides 99 <295<150 mg/dL   HDL 43 >62>40 mg/dL   Total CHOL/HDL Ratio 3.3 RATIO   VLDL 20 0 - 40 mg/dL   LDL Cholesterol 78 0 - 99 mg/dL    Comment:        Total Cholesterol/HDL:CHD Risk Coronary Heart Disease Risk Table                     Men   Women  1/2 Average Risk   3.4   3.3  Average Risk       5.0  4.4  2 X Average Risk   9.6   7.1  3 X Average Risk  23.4   11.0        Use the calculated Patient Ratio above and the CHD Risk Table to  determine the patient's CHD Risk.        ATP III CLASSIFICATION (LDL):  <100     mg/dL   Optimal  [id number]  mg/dL   Near or Above                    Optimal  130-159  mg/dL   Borderline  [id number]  mg/dL   High  >295>190     mg/dL   Very High Performed at [facility], [address] [place], [place] [address]   Urinalysis, Complete w Microscopic     Status: Abnormal   Collection Time: 11/23/18  6:06 PM  Result Value Ref Range   Color, Urine STRAW (A) YELLOW   APPearance CLEAR CLEAR   Specific Gravity, Urine 1.002 (L) 1.005 - 1.030   pH 6.0 5.0 - 8.0   Glucose, UA NEGATIVE NEGATIVE mg/dL   Hgb urine dipstick NEGATIVE NEGATIVE   Bilirubin Urine NEGATIVE NEGATIVE   Ketones, ur NEGATIVE NEGATIVE mg/dL   Protein, ur NEGATIVE NEGATIVE mg/dL   Nitrite NEGATIVE NEGATIVE   Leukocytes,Ua NEGATIVE NEGATIVE   RBC / HPF 0-5 0 - 5 RBC/hpf   WBC, UA 6-10 0 - 5 WBC/hpf   Bacteria, UA MANY (A) NONE SEEN   Squamous Epithelial / LPF 0-5 0 - 5    Comment: Performed at [facility], [address] [place], [place] [address]    Blood Alcohol level:  Lab Results  Component Value Date   ETH <10 11/21/2018    Metabolic Disorder Labs: Lab Results  Component Value Date   HGBA1C 4.2 (L) 11/23/2018   MPG 73.84 11/23/2018   No results found for: PROLACTIN Lab Results  Component Value Date   CHOL 141 11/23/2018   TRIG 99 11/23/2018   HDL 43 11/23/2018   CHOLHDL 3.3 11/23/2018   VLDL 20 11/23/2018   LDLCALC 78 11/23/2018    Physical Findings: AIMS:  , ,  ,  ,    CIWA:    COWS:     Musculoskeletal: Strength & Muscle Tone: within normal limits Gait & Station: normal Patient leans: N/A  Psychiatric Specialty Exam: Physical Exam  Nursing note and vitals reviewed. Constitutional: She is oriented to person, place, and time. She appears well-developed and well-nourished.  HENT:  Head: Normocephalic and atraumatic.  Respiratory: Effort normal.   Neurological: She is alert and oriented to person, place, and time.    ROS  Blood pressure (!) 89/51, pulse (!) 133, temperature 98.5 F (36.9 C), temperature source Oral, resp. rate 16, height 5\' 3"  (1.6 m), weight 61.2 kg.Body mass index is 23.91 kg/m.  General Appearance: Disheveled  Eye Contact:  Fair  Speech:  Normal Rate  Volume:  Increased  Mood:  Anxious, Depressed and Dysphoric  Affect:  Congruent  Thought Process:  Coherent and Descriptions of Associations: Intact  Orientation:  Full (Time, Place, and Person)  Thought Content:  Logical  Suicidal Thoughts:  No  Homicidal Thoughts:  No  Memory:  Immediate;   Fair Recent;   Fair Remote;   Fair  Judgement:  Fair  Insight:  Fair  Psychomotor Activity:  Increased  Concentration:  Concentration: Fair and Attention Span: Fair  Recall:  Fair  Fund of Knowledge:  Fair  Language:  Good  Akathisia:  Negative  Handed:  Right  AIMS (if indicated):     Assets:  Desire for Improvement Resilience  ADL's:  Intact  Cognition:  WNL  Sleep:  Number of Hours: 6.5     Treatment Plan Summary: Daily contact with patient to assess and evaluate symptoms and progress in treatment, Medication management and Plan : Patient is seen and examined.  Patient is a 28 year old female with the above-stated past psychiatric history who is seen in follow-up.   Diagnosis: #1 bipolar disorder, most recently mixed, severe without psychotic features, #2 posttraumatic stress disorder, #3 intrauterine pregnancy.  Patient is seen in follow-up.  She developed significant nausea either to the Latuda or the Zoloft.  She wants to stop those.  We will.  She had previously been treated with Seroquel, and did her mood well.  She had gained weight on it, and we are presuming really believing that that is the reason why they stopped it.  She also had previously been successfully treated with Prozac as well as Lamictal.  She has been off those medicines for at least 2  months.  We will restart her fluoxetine at 10 mg p.o. daily, we will restart her Lamictal at 25 mg p.o. daily, and I have discussed the Seroquel with the pharmacy with regard to the pregnancy, and it seems to be at least not a major issue.  We will start at 25 mg p.o. nightly and titrate during the course the hospitalization.  Her blood pressure was a bit low today, and we will monitor that.  Also her urine culture is come back and recommendations are for Keflex 500 mg p.o. twice daily for her urinary tract infection in the presence of her pregnancy.  We will get that started today. 1.  Start Keflex 500 mg p.o. every 12 hours for 5 days secondary to urinary tract infection. 2.  Stop the Zoloft. 3.  Stop Latuda. 4.  Start fluoxetine 10 mg p.o. daily for mood and anxiety. 5.  Continue hydroxyzine 25 mg p.o. every 4 hours as needed anxiety. 6.  Restart Lamictal at 25 mg p.o. daily for mood stability. 7.  Continue Zofran 4 mg p.o. every 8 hours as needed nausea. 8.  Start Seroquel 25 mg p.o. nightly for mood stability and sleep and titrate during the course the hospitalization. 9.  Disposition planning-in progress.  Antonieta PertGreg Lawson Keena Heesch, MD 11/24/2018, 11:03 AM

## 2018-11-24 NOTE — Progress Notes (Signed)
Patient refused zoloft this morning stated "Zolft made me feel like I'm coming out of my skin, 1,000 times worse. "

## 2018-11-24 NOTE — Progress Notes (Signed)
[place] NOVEL CORONAVIRUS (COVID-19) DAILY CHECK-OFF SYMPTOMS - answer yes or no to each - every day NO YES  Have you had a fever in the past 24 hours?  . Fever (Temp > 37.80C / 100F) X   Have you had any of these symptoms in the past 24 hours? . New Cough .  Sore Throat  .  Shortness of Breath .  Difficulty Breathing .  Unexplained Body Aches   X   Have you had any one of these symptoms in the past 24 hours not related to allergies?   . Runny Nose .  Nasal Congestion .  Sneezing   X   If you have had runny nose, nasal congestion, sneezing in the past 24 hours, has it worsened?  X   EXPOSURES - check yes or no X   Have you traveled outside the state in the past 14 days?  X   Have you been in contact with someone with a confirmed diagnosis of COVID-19 or PUI in the past 14 days without wearing appropriate PPE?  X   Have you been living in the same home as a person with confirmed diagnosis of COVID-19 or a PUI (household contact)?    X   Have you been diagnosed with COVID-19?    X              What to do next: Answered NO to all: Answered YES to anything:   Proceed with unit schedule Follow the [facility] Inpatient Flowsheet.   

## 2018-11-24 NOTE — Progress Notes (Signed)
D:  Patient's self inventory sheet, patient has poor sleep, no sleep medication.  Poor appetite, low energy level, poor concentration.  Rated depression 8,  Hopeless 8, anxiety 10.  Denied withdrawals.  Denied SI.  Physical problems, lightheaded, dizziness, headaches.  Denied physical pain.  Goal is fix my meds.  Plans to talk to MD.  No discharge plans. A:  Medications administered per MD orders.  Emotional support and encouragement given patient. R:  Denied SI and HI, contracts for safety.  Denied A/V hallucinations.  Safety maintained with 15 minute checks.

## 2018-11-25 NOTE — Progress Notes (Signed)
[place] NOVEL CORONAVIRUS (COVID-19) DAILY CHECK-OFF SYMPTOMS - answer yes or no to each - every day NO YES  Have you had a fever in the past 24 hours?  . Fever (Temp > 37.80C / 100F) X   Have you had any of these symptoms in the past 24 hours? . New Cough .  Sore Throat  .  Shortness of Breath .  Difficulty Breathing .  Unexplained Body Aches   X   Have you had any one of these symptoms in the past 24 hours not related to allergies?   . Runny Nose .  Nasal Congestion .  Sneezing   X   If you have had runny nose, nasal congestion, sneezing in the past 24 hours, has it worsened?  X   EXPOSURES - check yes or no X   Have you traveled outside the state in the past 14 days?  X   Have you been in contact with someone with a confirmed diagnosis of COVID-19 or PUI in the past 14 days without wearing appropriate PPE?  X   Have you been living in the same home as a person with confirmed diagnosis of COVID-19 or a PUI (household contact)?    X   Have you been diagnosed with COVID-19?    X              What to do next: Answered NO to all: Answered YES to anything:   Proceed with unit schedule Follow the [facility] Inpatient Flowsheet.   

## 2018-11-25 NOTE — Progress Notes (Signed)
Pt rates depression 5/10, anxiety 8/10, and hopelessness 5/10. Pt reported good sleep last night. Pt denies SI/HI. Pt reports decreased anxiety and depression today. Pt compliant with taking meds and attending groups. No side effects to medication verbalized by pt.   Orders reviewed by pt. Verbal support provided. Pt encouraged to attend groups. 15 minute checks performed for safety.  Pt compliant with tx plan.

## 2018-11-25 NOTE — Plan of Care (Signed)
  Problem: Education: Goal: Emotional status will improve Outcome: Progressing Goal: Mental status will improve Outcome: Progressing   Problem: Education: Goal: Mental status will improve Outcome: Progressing   

## 2018-11-25 NOTE — Progress Notes (Signed)
[facility] MD Progress Note  11/25/2018 11:17 AM [name]  MRN:  [id number] Subjective:  Patient is seen and examined.  Patient is a 28 year old female with a past psychiatric history significant for bipolar disorder who was admitted on 11/23/2018 with suicidal ideation.  Objective: Patient is a 28 year old female with the above-stated past psychiatric history who is seen in follow-up.  She is he is doing better today.  She slept with the Seroquel 25 mg p.o. nightly.  Additionally her nausea has resolved, and she was able to take the Lamictal and the Prozac without difficulty yesterday.  She stated she felt better today.  She is very happy she is back on medications.  She denied any side effects to her current medications.  She denied any suicidal ideation.  She is tachycardic this morning, but otherwise is doing well.  Her pulse was 140 this morning, but blood pressure was stable.  She slept 6.5 hours last night.  No new laboratories.  Her previous EKG was a normal sinus rhythm with normal QTC.  Principal Problem: <principal problem not specified> Diagnosis: Active Problems:   Bipolar 1 disorder (HCC)  Total Time spent with patient: 15 minutes  Past Psychiatric History: See admission H&P  Past Medical History:  Past Medical History:  Diagnosis Date  . Anxiety   . Bipolar disorder ([place])   . Depression   . Endometriosis     Past Surgical History:  Procedure Laterality Date  . APPENDECTOMY    . CHOLECYSTECTOMY     Family History: History reviewed. No pertinent family history. Family Psychiatric  History: See admission H&P Social History:  Social History   Substance and Sexual Activity  Alcohol Use Never  . Frequency: Never     Social History   Substance and Sexual Activity  Drug Use Never    Social History   Socioeconomic History  . Marital status: Single    Spouse name: Not on file  . Number of children: Not on file  . Years of education: Not on file  . Highest  education level: Not on file  Occupational History  . Not on file  Social Needs  . Financial resource strain: Not on file  . Food insecurity    Worry: Not on file    Inability: Not on file  . Transportation needs    Medical: Not on file    Non-medical: Not on file  Tobacco Use  . Smoking status: Never Smoker  . Smokeless tobacco: Never Used  Substance and Sexual Activity  . Alcohol use: Never    Frequency: Never  . Drug use: Never  . Sexual activity: Not on file  Lifestyle  . Physical activity    Days per week: Not on file    Minutes per session: Not on file  . Stress: Not on file  Relationships  . Social Herbalist on phone: Not on file    Gets together: Not on file    Attends religious service: Not on file    Active member of club or organization: Not on file    Attends meetings of clubs or organizations: Not on file    Relationship status: Not on file  Other Topics Concern  . Not on file  Social History Narrative  . Not on file   Additional Social History:                         Sleep: Good  Appetite:  Fair  Current Medications: Current Facility-Administered Medications  Medication Dose Route Frequency Provider Last Rate Last Dose  . acetaminophen (TYLENOL) tablet 650 mg  650 mg Oral Q6H PRN Denzil Magnusonhomas, Lashunda, NP   650 mg at 11/22/18 2155  . alum & mag hydroxide-simeth (MAALOX/MYLANTA) 200-200-20 MG/5ML suspension 30 mL  30 mL Oral Q4H PRN Denzil Magnusonhomas, Lashunda, NP      . cephALEXin (KEFLEX) capsule 500 mg  500 mg Oral Q12H Antonieta Pertlary, Niv Darley Lawson, MD   500 mg at 11/25/18 0744  . FLUoxetine (PROZAC) capsule 10 mg  10 mg Oral Daily Antonieta Pertlary, Fannie Alomar Lawson, MD   10 mg at 11/25/18 0744  . hydrOXYzine (ATARAX/VISTARIL) tablet 25 mg  25 mg Oral Q4H PRN Antonieta Pertlary, Sima Lindenberger Lawson, MD      . lamoTRIgine (LAMICTAL) tablet 25 mg  25 mg Oral Daily Antonieta Pertlary, Syaire Saber Lawson, MD   25 mg at 11/25/18 0744  . ondansetron (ZOFRAN) tablet 4 mg  4 mg Oral Q8H PRN Antonieta Pertlary, Davaun Quintela Lawson, MD    4 mg at 11/24/18 0829  . QUEtiapine (SEROQUEL) tablet 25 mg  25 mg Oral QHS Antonieta Pertlary, Tonee Silverstein Lawson, MD   25 mg at 11/24/18 2202    Lab Results:  Results for orders placed or performed during the hospital encounter of 11/22/18 (from the past 48 hour(s))  Urinalysis, Complete w Microscopic     Status: Abnormal   Collection Time: 11/23/18  6:06 PM  Result Value Ref Range   Color, Urine STRAW (A) YELLOW   APPearance CLEAR CLEAR   Specific Gravity, Urine 1.002 (L) 1.005 - 1.030   pH 6.0 5.0 - 8.0   Glucose, UA NEGATIVE NEGATIVE mg/dL   Hgb urine dipstick NEGATIVE NEGATIVE   Bilirubin Urine NEGATIVE NEGATIVE   Ketones, ur NEGATIVE NEGATIVE mg/dL   Protein, ur NEGATIVE NEGATIVE mg/dL   Nitrite NEGATIVE NEGATIVE   Leukocytes,Ua NEGATIVE NEGATIVE   RBC / HPF 0-5 0 - 5 RBC/hpf   WBC, UA 6-10 0 - 5 WBC/hpf   Bacteria, UA MANY (A) NONE SEEN   Squamous Epithelial / LPF 0-5 0 - 5    Comment: Performed at [facility], [address] [place], [place] [id number]    Blood Alcohol level:  Lab Results  Component Value Date   ETH <10 11/21/2018    Metabolic Disorder Labs: Lab Results  Component Value Date   HGBA1C 4.2 (L) 11/23/2018   MPG 73.84 11/23/2018   No results found for: PROLACTIN Lab Results  Component Value Date   CHOL 141 11/23/2018   TRIG 99 11/23/2018   HDL 43 11/23/2018   CHOLHDL 3.3 11/23/2018   VLDL 20 11/23/2018   LDLCALC 78 11/23/2018    Physical Findings: AIMS: Facial and Oral Movements Muscles of Facial Expression: None, normal Lips and Perioral Area: None, normal Jaw: None, normal Tongue: None, normal,Extremity Movements Upper (arms, wrists, hands, fingers): None, normal Lower (legs, knees, ankles, toes): None, normal, Trunk Movements Neck, shoulders, hips: None, normal, Overall Severity Severity of abnormal movements (highest score from questions above): None, normal Incapacitation due to abnormal movements: None, normal Patient's  awareness of abnormal movements (rate only patient's report): No Awareness, Dental Status Current problems with teeth and/or dentures?: No Does patient usually wear dentures?: No  CIWA:  CIWA-Ar Total: 1 COWS:  COWS Total Score: 2  Musculoskeletal: Strength & Muscle Tone: within normal limits Gait & Station: normal Patient leans: N/A  Psychiatric Specialty Exam: Physical Exam  Nursing note and vitals reviewed. Constitutional: She  is oriented to person, place, and time. She appears well-developed and well-nourished.  HENT:  Head: Normocephalic and atraumatic.  Respiratory: Effort normal.  Neurological: She is alert and oriented to person, place, and time.    ROS  Blood pressure 107/63, pulse (!) 140, temperature 98.5 F (36.9 C), temperature source Oral, resp. rate 16, height 5\' 3"  (1.6 m), weight 61.2 kg.Body mass index is 23.91 kg/m.  General Appearance: Casual  Eye Contact:  Fair  Speech:  Normal Rate  Volume:  Normal  Mood:  Anxious  Affect:  Congruent  Thought Process:  Coherent and Descriptions of Associations: Intact  Orientation:  Full (Time, Place, and Person)  Thought Content:  Logical  Suicidal Thoughts:  No  Homicidal Thoughts:  No  Memory:  Immediate;   Fair Recent;   Fair Remote;   Fair  Judgement:  Intact  Insight:  Fair  Psychomotor Activity:  Normal  Concentration:  Concentration: Fair and Attention Span: Fair  Recall:  [employer]Fair  Fund of Knowledge:  Fair  Language:  Good  Akathisia:  Negative  Handed:  Right  AIMS (if indicated):     Assets:  Desire for Improvement Resilience  ADL's:  Intact  Cognition:  WNL  Sleep:  Number of Hours: 6.25     Treatment Plan Summary: Daily contact with patient to assess and evaluate symptoms and progress in treatment, Medication management and Plan : Patient is seen and examined.  Patient is a 28 year old female with the above-stated past psychiatric history who is seen in follow-up.   Diagnosis: #1 bipolar  disorder, most recently mixed, severe without psychotic features, #2 posttraumatic stress disorder, #3 intrauterine pregnancy.  Patient is doing better today.  Her anxiety is decreased, her sleep is improved, and overall her mood is improved.  She denied any side effects or current medications.  No change in those medicines today.  I am going to get an EKG given the rate increase up to 140.  She was also started on Keflex for urinary tract infection, and I am sure that is helped as well.  Hopefully she will continue to improve. 1.  Continue Keflex 500 mg p.o. every 12 hours for 5 days secondary to urinary tract infection. 2.  Continue fluoxetine 10 mg p.o. daily for mood and anxiety. 3.  Continue Seroquel 25 mg p.o. nightly for mood stability and sleep. 4.  Continue hydroxyzine 25 mg p.o. every 4 hours as needed anxiety. 5.  Continue Lamictal 25 mg p.o. daily for mood stability. 6.  Continue Zofran 4 mg p.o. every 8 hours as needed nausea. 7.  Disposition planning-in progress.  Antonieta PertGreg Lawson Madison Albea, MD 11/25/2018, 11:17 AM

## 2018-11-26 MED ORDER — LAMOTRIGINE 25 MG PO TABS
50.0000 mg | ORAL_TABLET | Freq: Every day | ORAL | Status: DC
  Administered 2018-11-27 – 2018-11-28 (×2): 50 mg via ORAL
  Filled 2018-11-26 (×4): qty 2

## 2018-11-26 MED ORDER — FLUOXETINE HCL 20 MG PO CAPS
20.0000 mg | ORAL_CAPSULE | Freq: Every day | ORAL | Status: DC
  Administered 2018-11-27 – 2018-11-28 (×2): 20 mg via ORAL
  Filled 2018-11-26 (×5): qty 1
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
## 2018-11-26 NOTE — BHH Group Notes (Signed)
[facility] Group Notes:  (Nursing/MHT/Case Management/Adjunct)  Date:  11/26/2018  Time:  1:15 PM  Type of Therapy:  Nurse Education  Participation Level:  Active  Participation Quality:  Appropriate and Attentive  Affect:  Appropriate  Cognitive:  Alert and Appropriate  Insight:  Appropriate  Engagement in Group:  Engaged and Improving  Modes of Intervention:  Discussion, Education and Exploration  Summary of Progress/Problems:  pt's were instructed to identify needs and then discuss how these needs could be met/obtained  Baron Sane 11/26/2018, 3:06 PM

## 2018-11-26 NOTE — Progress Notes (Signed)
DAR NOTE: Pt present with calm affect and depressed jovial mood in the unit. Pt stated she had a good day talking to peers, although there was not much to do. Pt reports good sleep and good appetite, but complained of mood swings due to her pregnancy.  Pt denies physical pain, took all her meds as scheduled. Pt's safety ensured with 15 minute and environmental checks. Pt currently denies SI/HI and A/V hallucinations. Pt verbally agrees to seek staff if SI/HI or A/VH occurs and to consult with staff before acting on these thoughts. Will continue POC.
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
## 2018-11-26 NOTE — Plan of Care (Signed)
Progress note  D: pt presented to the medication window; compliant with medication administration. Pt denies any physical symptoms or pain. Pt states she is still groggy. Pt is minimal in her interactions. Pt has been viewed in the milieu interacting and has changed clothes/showered showing evidence of self care. Pt denies si/hi/ah/vh and verbally agrees to approach staff if these become apparent or before harming himself/others while at [place]: Pt provided support and encouragement. Pt given medication per protocol and standing orders. Q42m safety checks implemented and continued.  R: Pt safe on the unit. Will continue to monitor.  Pt progressing in the following metrics  Problem: Education: Goal: Knowledge of [facility] Education information/materials will improve Outcome: Progressing Goal: Verbalization of understanding the information provided will improve Outcome: Progressing   Problem: Education: Goal: Ability to state activities that reduce stress will improve Outcome: Progressing   Problem: Coping: Goal: Ability to identify and develop effective coping behavior will improve Outcome: Progressing

## 2018-11-26 NOTE — Progress Notes (Signed)
D.  Pt pleasant on approach, no complaints voiced at this time.  Pt was observed out and active interacting appropriately with peers on the unit.  Pt denies SI/HI/AVH at this time.  A.  Support and encouragement offered,medication given as ordered  R.  Pt remains safe on the unit, will continue to monitor.

## 2018-11-27 ENCOUNTER — Encounter (HOSPITAL_COMMUNITY): Payer: Self-pay | Admitting: Behavioral Health

## 2018-11-27 NOTE — B[facility] Group Notes (Signed)
[facility] LCSW Group Therapy Note  Date/Time:  11/27/2018    10:15-11:15AM  Type of Therapy and Topic:  Group Therapy:  Healthy and Unhealthy Supports  Participation Level:  Active  Description of Group:  Patients in this group were led to examine the current supports in their lives and determine whether those supports are healthy or unhealthy for them.   Setting boundaries was discussed and described in detail.  An emphasis was placed on using individual counselor, doctor, therapy groups, 12-step groups, and problem-specific support groups to expand supports.  Therapeutic Goals: 1) compare healthy versus unhealthy supports and identify some examples of each 2) discuss importance of setting boundaries or terminating relationships with unhealthy supports  3)  generate ideas and descriptions of healthy supports that can be added  4)  offer mutual support about how to address unhealthy supports  5)  encourage active participation in and adherence to discharge plan    Summary of Patient Progress:  The patient stated that current healthy supports in her life are her doctor, her therapist, one friend, and her son's stemother while current unhealthy supports include her family because of their "drama," people she thought were friends but who did not listen to her requests to not do certain things around her.  The patient expressed having a great deal in common with others in the group, interacted well.   Therapeutic Modalities:   Brief Solution-Focused Therapy  Selmer Dominion, LCSW

## 2018-11-27 NOTE — B[facility] Group Notes (Signed)
Life SKills  Date:  11/27/2018  Time:  1300  Type of Therapy:  Nurse Education  : The group focuses on teaching patients how to identify unhealhty coping skills and learning to develop and practice newer healtheir coping skills to get their needs met.  Participation Level:  Active  Participation Quality:  Attentive  Affect:  Appropriate  Cognitive:  Alert  Insight:  Appropriate  Engagement in Group:  Engaged  Modes of Intervention:  Education  Summary of Progress/Problems:  [name] 11/27/2018, 3:42 PM

## 2018-11-27 NOTE — Plan of Care (Signed)
Nurse discussed anxiety, depression and coping skills with patient.  

## 2018-11-27 NOTE — Progress Notes (Signed)
[facility] MD Progress Note  11/27/2018 12:03 PM [name]  MRN:  [id number]   Subjective:  [name] reported " I am doing much better compared to when I first got here."   Evaluation: Face to face evaluation completed and chart reviewed. In brief; Patient was admitted to the hospital due to worsening depression and mood irritability. She presents with a history of bipolar depression however stated she had been off her medication for the past 2-1/2 months due to she was unable to report on medications and recently found out she was pregnant.  During this evaluation, she is alert and oriented x3, calm and cooperative. She endorses improved mood and her affect is appropriate. She denies any suicidal thoughts, homicidal ideas or psychosis. She does continue to endorse anxiety rating her anxiety as 7/10 with 10 being the worst. She endorses she is worried about going back home into her current living environment which she decribes as stressful. She has asked for resources for housing. She was advised that she could speak with social worker in regard to housing resources. Otherwise, she notes no other concerns at this time. She reports eating and sleeping well. She reports she is active for unit activities and reports she feels like group sessions are helpful and therapeutic. She concerns with medications or no current issues or concerns with pregnancy.       Principal Problem: Bipolar 1 disorder ([place]) Diagnosis: Principal Problem:   Bipolar 1 disorder ([place])  Total Time spent with patient: 15 minutes  Past Psychiatric History:   Past Medical History:  Past Medical History:  Diagnosis Date  . Anxiety   . Bipolar disorder ([place])   . Depression   . Endometriosis     Past Surgical History:  Procedure Laterality Date  . APPENDECTOMY    . CHOLECYSTECTOMY     Family History: History reviewed. No pertinent family history. Family Psychiatric  History:  Social History:  Social History    Substance and Sexual Activity  Alcohol Use Never  . Frequency: Never     Social History   Substance and Sexual Activity  Drug Use Never    Social History   Socioeconomic History  . Marital status: Single    Spouse name: Not on file  . Number of children: Not on file  . Years of education: Not on file  . Highest education level: Not on file  Occupational History  . Not on file  Social Needs  . Financial resource strain: Not on file  . Food insecurity    Worry: Not on file    Inability: Not on file  . Transportation needs    Medical: Not on file    Non-medical: Not on file  Tobacco Use  . Smoking status: Never Smoker  . Smokeless tobacco: Never Used  Substance and Sexual Activity  . Alcohol use: Never    Frequency: Never  . Drug use: Never  . Sexual activity: Not on file  Lifestyle  . Physical activity    Days per week: Not on file    Minutes per session: Not on file  . Stress: Not on file  Relationships  . Social Herbalist on phone: Not on file    Gets together: Not on file    Attends religious service: Not on file    Active member of club or organization: Not on file    Attends meetings of clubs or organizations: Not on file    Relationship status: Not on  file  Other Topics Concern  . Not on file  Social History Narrative  . Not on file   Additional Social History:                         Sleep: Fair  Appetite:  Fair  Current Medications: Current Facility-Administered Medications  Medication Dose Route Frequency Provider Last Rate Last Dose  . acetaminophen (TYLENOL) tablet 650 mg  650 mg Oral Q6H PRN Denzil Magnusonhomas, Aryam Zhan, NP   650 mg at 11/25/18 1906  . alum & mag hydroxide-simeth (MAALOX/MYLANTA) 200-200-20 MG/5ML suspension 30 mL  30 mL Oral Q4H PRN Denzil Magnusonhomas, Adalai Perl, NP      . cephALEXin (KEFLEX) capsule 500 mg  500 mg Oral Q12H Antonieta Pertlary, Greg Lawson, MD   500 mg at 11/27/18 0841  . FLUoxetine (PROZAC) capsule 20 mg  20 mg Oral  Daily Oneta RackLewis, Tanika N, NP   20 mg at 11/27/18 0840  . hydrOXYzine (ATARAX/VISTARIL) tablet 25 mg  25 mg Oral Q4H PRN Antonieta Pertlary, Greg Lawson, MD      . lamoTRIgine (LAMICTAL) tablet 50 mg  50 mg Oral Daily Oneta RackLewis, Tanika N, NP   50 mg at 11/27/18 0838  . ondansetron (ZOFRAN) tablet 4 mg  4 mg Oral Q8H PRN Antonieta Pertlary, Greg Lawson, MD   4 mg at 11/24/18 0829  . QUEtiapine (SEROQUEL) tablet 25 mg  25 mg Oral QHS Antonieta Pertlary, Greg Lawson, MD   25 mg at 11/26/18 2105    Lab Results: No results found for this or any previous visit (from the past 48 hour(s)).  Blood Alcohol level:  Lab Results  Component Value Date   ETH <10 11/21/2018    Metabolic Disorder Labs: Lab Results  Component Value Date   HGBA1C 4.2 (L) 11/23/2018   MPG 73.84 11/23/2018   No results found for: PROLACTIN Lab Results  Component Value Date   CHOL 141 11/23/2018   TRIG 99 11/23/2018   HDL 43 11/23/2018   CHOLHDL 3.3 11/23/2018   VLDL 20 11/23/2018   LDLCALC 78 11/23/2018    Physical Findings: AIMS: Facial and Oral Movements Muscles of Facial Expression: None, normal Lips and Perioral Area: None, normal Jaw: None, normal Tongue: None, normal,Extremity Movements Upper (arms, wrists, hands, fingers): None, normal Lower (legs, knees, ankles, toes): None, normal, Trunk Movements Neck, shoulders, hips: None, normal, Overall Severity Severity of abnormal movements (highest score from questions above): None, normal Incapacitation due to abnormal movements: None, normal Patient's awareness of abnormal movements (rate only patient's report): No Awareness, Dental Status Current problems with teeth and/or dentures?: No Does patient usually wear dentures?: No  CIWA:  CIWA-Ar Total: 1 COWS:  COWS Total Score: 2  Musculoskeletal: Strength & Muscle Tone: within normal limits Gait & Station: normal Patient leans: N/A  Psychiatric Specialty Exam: Physical Exam  Nursing note and vitals reviewed. Constitutional: She appears  well-developed.  Psychiatric: She has a normal mood and affect.    Review of Systems  Psychiatric/Behavioral: Positive for depression. The patient is nervous/anxious.   All other systems reviewed and are negative.   Blood pressure 109/63, pulse (!) 102, temperature 98.4 F (36.9 C), temperature source Oral, resp. rate 16, height 5\' 3"  (1.6 m), weight 61.2 kg.Body mass index is 23.91 kg/m.  General Appearance: Casual  Eye Contact:  Fair  Speech:  Clear and Coherent  Volume:  Normal  Mood:  Anxious and Depressed  Affect:  Congruent  Thought Process:  Coherent  Orientation:  Full (Time, Place, and Person)  Thought Content:  Logical  Suicidal Thoughts:  No  Homicidal Thoughts:  No  Memory:  Immediate;   Fair Recent;   Fair Remote;   Fair  Judgement:  Fair  Insight:  Fair  Psychomotor Activity:  Normal  Concentration:  Concentration: Fair  Recall:  [employer]Fair  Fund of Knowledge:  Fair  Language:  Fair  Akathisia:  No  Handed:  Right  AIMS (if indicated):     Assets:  Communication Skills Desire for Improvement Resilience Social Support  ADL's:  Intact  Cognition:  WNL  Sleep:  Number of Hours: 6.75     Treatment Plan Summary: Daily contact with patient to assess and evaluate symptoms and progress in treatment and Medication management   Continue with current treatment plan 11/27/2018 with no adjustments at this time.   Bipolar Disorder:   Increase Lamictal 50 mg po daily Increased Prozac 10 mg to 20 mg po daily Continue Seroquel 25 mg po Qhs   CSW to continue working on discharge disposition Patient encouraged to participation  with daily group session    Denzil MagnusonLaShunda Gaylon Bentz, NP 11/27/2018, 12:03 PM   Patient ID: Tina FlockKaitlyn Gates, female   DOB: 27-Nov-1990, 28 y.o.   MRN: [id number]

## 2018-11-27 NOTE — [facility] Group Notes (Signed)
Adult Psychoeducational Group Note  Date:  11/27/2018 Time:  9:41 PM  Group Topic/Focus:  Wrap-Up Group:   The focus of this group is to help patients review their daily goal of treatment and discuss progress on daily workbooks.  Participation Level:  Active  Participation Quality:  Appropriate and Attentive  Affect:  Appropriate  Cognitive:  Alert and Appropriate  Insight: Appropriate and Good  Engagement in Group:  Engaged  Modes of Intervention:  Discussion and Education  Additional Comments:  Pt attended and participated in wrap up group this evening and rated their day a 9/10. Pt achieved their goal to stay positive.   Cristi Loron 11/27/2018, 9:41 PM

## 2018-11-27 NOTE — Progress Notes (Signed)
D:  Patient's self inventory sheet, patient sleeps good, no sleep medication.  Good appetite, normal energy level, good concentration.  Rated depression, anxiety, coping skills #%.  Denied withdrawals.  Denied SI.  Denied physical problems.  Denied physical pain.  Goal is not stress about things out of her control.  Plans to stay positive.  No discharge plans. A:  Medications administered per MD orders. Emotional support and encouragement given patient. R:  Denied SI and HI, contracts for safety.  Denied A/V hallucinations.  Safety maintained with 15 minute  Checks.

## 2018-11-28 MED ORDER — QUETIAPINE FUMARATE 25 MG PO TABS: 25.0000 mg | ORAL_TABLET | Freq: Every day | ORAL | 0 refills | Status: DC

## 2018-11-28 MED ORDER — FLUOXETINE HCL 20 MG PO CAPS: 20.0000 mg | ORAL_CAPSULE | Freq: Every day | ORAL | 0 refills | Status: DC

## 2018-11-28 MED ORDER — LAMOTRIGINE 25 MG PO TABS: 50.0000 mg | ORAL_TABLET | Freq: Every day | ORAL | 0 refills | Status: DC

## 2018-11-28 MED ORDER — CEPHALEXIN 500 MG PO CAPS: 500.0000 mg | ORAL_CAPSULE | Freq: Two times a day (BID) | ORAL | 0 refills | Status: DC

## 2018-11-28 NOTE — [facility] Suicide Risk Assessment (Signed)
[facility] Discharge Suicide Risk Assessment   Principal Problem: Bipolar 1 disorder [facility]) Discharge Diagnoses: Principal Problem:   Bipolar 1 disorder ([place])   Total Time spent with patient: 15 minutes  Musculoskeletal: Strength & Muscle Tone: within normal limits Gait & Station: normal Patient leans: N/A  Psychiatric Specialty Exam: Review of Systems  All other systems reviewed and are negative.   Blood pressure 109/63, pulse (!) 102, temperature 98.4 F (36.9 C), temperature source Oral, resp. rate 16, height 5\' 3"  (1.6 m), weight 61.2 kg.Body mass index is 23.91 kg/m.  General Appearance: Casual  Eye Contact::  Good  Speech:  Normal Rate409  Volume:  Normal  Mood:  Euthymic  Affect:  Congruent  Thought Process:  Coherent and Descriptions of Associations: Intact  Orientation:  Full (Time, Place, and Person)  Thought Content:  Logical  Suicidal Thoughts:  No  Homicidal Thoughts:  No  Memory:  Immediate;   Fair Recent;   Fair Remote;   Fair  Judgement:  Intact  Insight:  Fair  Psychomotor Activity:  Normal  Concentration:  Fair  Recall:  [employer] of Knowledge:Good  Language: Good  Akathisia:  Negative  Handed:  Right  AIMS (if indicated):     Assets:  Desire for Improvement Resilience  Sleep:  Number of Hours: 6.75  Cognition: WNL  ADL's:  Intact   Mental Status Per Nursing Assessment::   On Admission:  Suicidal ideation indicated by patient  Demographic Factors:  Caucasian, Low socioeconomic status and Unemployed  Loss Factors: Loss of significant relationship  Historical Factors: Impulsivity  Risk Reduction Factors:   Pregnancy, Responsible for children under 32 years of age, Sense of responsibility to family and Living with another person, especially a relative  Continued Clinical Symptoms:  Bipolar Disorder:   Depressive phase  Cognitive Features That Contribute To Risk:  None    Suicide Risk:  Minimal: No identifiable suicidal ideation.   Patients presenting with no risk factors but with morbid ruminations; may be classified as minimal risk based on the severity of the depressive symptoms    Plan Of Care/Follow-up recommendations:  Activity:  ad lib  Sharma Covert, MD 11/28/2018, 7:51 AM

## 2018-11-28 NOTE — Progress Notes (Signed)
Pt discharged to lobby. Pt was stable and appreciative at that time. All papers and prescriptions were given and valuables returned. Verbal understanding expressed. Denies SI/HI and A/VH. Pt given opportunity to express concerns and ask questions.  

## 2018-11-28 NOTE — Tx Team (Signed)
Interdisciplinary Treatment and Diagnostic Plan Update  11/28/2018 Time of Session:  [name] MRN: [id number]  Principal Diagnosis: Bipolar 1 disorder [facility](HCC)  Secondary Diagnoses: Principal Problem:   Bipolar 1 disorder (HCC)   Current Medications:  Current Facility-Administered Medications  Medication Dose Route Frequency Provider Last Rate Last Dose  . acetaminophen (TYLENOL) tablet 650 mg  650 mg Oral Q6H PRN Denzil Magnusonhomas, Lashunda, NP   650 mg at 11/27/18 2008  . alum & mag hydroxide-simeth (MAALOX/MYLANTA) 200-200-20 MG/5ML suspension 30 mL  30 mL Oral Q4H PRN Denzil Magnusonhomas, Lashunda, NP      . cephALEXin (KEFLEX) capsule 500 mg  500 mg Oral Q12H Antonieta Pertlary, Greg Lawson, MD   500 mg at 11/28/18 [id number]  . FLUoxetine (PROZAC) capsule 20 mg  20 mg Oral Daily Oneta RackLewis, Tanika N, NP   20 mg at 11/28/18 [id number]  . hydrOXYzine (ATARAX/VISTARIL) tablet 25 mg  25 mg Oral Q4H PRN Antonieta Pertlary, Greg Lawson, MD      . lamoTRIgine (LAMICTAL) tablet 50 mg  50 mg Oral Daily Oneta RackLewis, Tanika N, NP   50 mg at 11/28/18 [id number]  . ondansetron (ZOFRAN) tablet 4 mg  4 mg Oral Q8H PRN Antonieta Pertlary, Greg Lawson, MD   4 mg at 11/24/18 0829  . QUEtiapine (SEROQUEL) tablet 25 mg  25 mg Oral QHS Antonieta Pertlary, Greg Lawson, MD   25 mg at 11/27/18 2110   PTA Medications: Medications Prior to Admission  Medication Sig Dispense Refill Last Dose  . Prenatal Multivit-Min-Fe-FA (PRE-NATAL FORMULA) TABS Take by mouth.   Past Month at Unknown time    Patient Stressors: Educational concerns Financial difficulties Marital or family conflict Medication change or noncompliance Occupational concerns Substance abuse  Patient Strengths: Ability for insight Average or above average intelligence Capable of independent living Advertising account executiveCommunication skills Financial means Motivation for treatment/growth Supportive family/friends  Treatment Modalities: Medication Management, Group therapy, Case management,  1 to 1 session with clinician, Psychoeducation, Recreational  therapy.   Physician Treatment Plan for Primary Diagnosis: Bipolar 1 disorder (HCC) Long Term Goal(s): Improvement in symptoms so as ready for discharge Improvement in symptoms so as ready for discharge   Short Term Goals: Ability to identify changes in lifestyle to reduce recurrence of condition will improve Ability to verbalize feelings will improve Ability to disclose and discuss suicidal ideas Ability to demonstrate self-control will improve Ability to identify and develop effective coping behaviors will improve Ability to maintain clinical measurements within normal limits will improve Compliance with prescribed medications will improve Ability to identify changes in lifestyle to reduce recurrence of condition will improve Ability to verbalize feelings will improve Ability to disclose and discuss suicidal ideas Ability to demonstrate self-control will improve Ability to identify and develop effective coping behaviors will improve Ability to maintain clinical measurements within normal limits will improve Compliance with prescribed medications will improve  Medication Management: Evaluate patient's response, side effects, and tolerance of medication regimen.  Therapeutic Interventions: 1 to 1 sessions, Unit Group sessions and Medication administration.  Evaluation of Outcomes: Adequate for Discharge  Physician Treatment Plan for Secondary Diagnosis: Principal Problem:   Bipolar 1 disorder (HCC)  Long Term Goal(s): Improvement in symptoms so as ready for discharge Improvement in symptoms so as ready for discharge   Short Term Goals: Ability to identify changes in lifestyle to reduce recurrence of condition will improve Ability to verbalize feelings will improve Ability to disclose and discuss suicidal ideas Ability to demonstrate self-control will improve Ability to identify and develop effective coping behaviors will  improve Ability to maintain clinical measurements within  normal limits will improve Compliance with prescribed medications will improve Ability to identify changes in lifestyle to reduce recurrence of condition will improve Ability to verbalize feelings will improve Ability to disclose and discuss suicidal ideas Ability to demonstrate self-control will improve Ability to identify and develop effective coping behaviors will improve Ability to maintain clinical measurements within normal limits will improve Compliance with prescribed medications will improve     Medication Management: Evaluate patient's response, side effects, and tolerance of medication regimen.  Therapeutic Interventions: 1 to 1 sessions, Unit Group sessions and Medication administration.  Evaluation of Outcomes: Adequate for Discharge   RN Treatment Plan for Primary Diagnosis: Bipolar 1 disorder ([place]) Long Term Goal(s): Knowledge of disease and therapeutic regimen to maintain health will improve  Short Term Goals: Ability to participate in decision making will improve, Ability to verbalize feelings will improve, Ability to disclose and discuss suicidal ideas, Ability to identify and develop effective coping behaviors will improve and Compliance with prescribed medications will improve  Medication Management: RN will administer medications as ordered by provider, will assess and evaluate patient's response and provide education to patient for prescribed medication. RN will report any adverse and/or side effects to prescribing provider.  Therapeutic Interventions: 1 on 1 counseling sessions, Psychoeducation, Medication administration, Evaluate responses to treatment, Monitor vital signs and CBGs as ordered, Perform/monitor CIWA, COWS, AIMS and Fall Risk screenings as ordered, Perform wound care treatments as ordered.  Evaluation of Outcomes: Adequate for Discharge   LCSW Treatment Plan for Primary Diagnosis: Bipolar 1 disorder ([place]) Long Term Goal(s): Safe transition to  appropriate next level of care at discharge, Engage patient in therapeutic group addressing interpersonal concerns.  Short Term Goals: Engage patient in aftercare planning with referrals and resources  Therapeutic Interventions: Assess for all discharge needs, 1 to 1 time with Social worker, Explore available resources and support systems, Assess for adequacy in community support network, Educate family and significant other(s) on suicide prevention, Complete Psychosocial Assessment, Interpersonal group therapy.  Evaluation of Outcomes: Adequate for Discharge   Progress in Treatment: Attending groups: Yes.  Participating in groups: Yes. Taking medication as prescribed: Yes. Toleration medication: Yes. Family/Significant other contact made: No, will contact:  patient declined consent for collateral contacts Patient understands diagnosis: Yes. Discussing patient identified problems/goals with staff: Yes. Medical problems stabilized or resolved: Yes. Denies suicidal/homicidal ideation: Yes. Issues/concerns per patient self-inventory: No. Other:   New problem(s) identified: None   New Short Term/Long Term Goal(s): medication stabilization, elimination of SI thoughts, development of comprehensive mental wellness plan.    Patient Goals:    Discharge Plan or Barriers: Patient recently admitted. CSW will continue to follow and assess for appropriate referrals and possible discharge planning.    Reason for Continuation of Hospitalization: None   Estimated Length of Stay: Discharging, 11/28/2018  Attendees: Patient: 11/28/2018 8:50 AM  Physician: Dr. Myles Lipps, MD 11/28/2018 8:50 AM  Nursing: Selinda Eon.M, RN; Benjamine Mola.Jenetta Downer RN 11/28/2018 8:50 AM  RN Care Manager: 11/28/2018 8:50 AM  Social Worker: Radonna Ricker, [place] 11/28/2018 8:50 AM  Recreational Therapist:  11/28/2018 8:50 AM  Other:  11/28/2018 8:50 AM  Other:  11/28/2018 8:50 AM  Other: 11/28/2018 8:50 AM    Scribe for Treatment  Team: Marylee Floras, [place] 11/28/2018 8:50 AM

## 2018-11-28 NOTE — [facility] Suicide Risk Assessment (Addendum)
[facility] INPATIENT:  Family/Significant Other Suicide Prevention Education  Suicide Prevention Education:  Patient Refusal for Family/Significant Other Suicide Prevention Education: The patient [name] has refused to provide written consent for family/significant other to be provided Family/Significant Other Suicide Prevention Education during admission and/or prior to discharge.  Physician notified.  SPE completed with patient, as patient refused to consent to family contact. SPI pamphlet provided to pt and pt was encouraged to share information with support network, ask questions, and talk about any concerns relating to SPE. Patient denies access to guns/firearms and verbalized understanding of information provided. Mobile Crisis information also provided to patient.    Marylee Floras 11/28/2018, 3:20 PM
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
## 2018-11-28 NOTE — Progress Notes (Signed)
D.  Pt pleasant on approach, denies complaints at this time.  Pt was positive for evening wrap up group, observed engaged in appropriate interaction with peers on the unit.  PT denies SI/HI/AVH at this time.  A.  Support and encouragement offered, medication given as ordered  R. Pt remains safe on the unit, will continue to monitor.

## 2018-11-30 ENCOUNTER — Encounter (HOSPITAL_BASED_OUTPATIENT_CLINIC_OR_DEPARTMENT_OTHER): Payer: Self-pay

## 2019-01-18 ENCOUNTER — Other Ambulatory Visit: Payer: Self-pay

## 2019-01-18 DIAGNOSIS — Z20822 Contact with and (suspected) exposure to covid-19: Secondary | ICD-10-CM

## 2019-01-19 LAB — NOVEL CORONAVIRUS, NAA: SARS-CoV-2, NAA: NOT DETECTED

## 2019-04-20 ENCOUNTER — Other Ambulatory Visit: Payer: Self-pay

## 2019-04-20 DIAGNOSIS — Z20822 Contact with and (suspected) exposure to covid-19: Secondary | ICD-10-CM

## 2019-04-23 LAB — NOVEL CORONAVIRUS, NAA: SARS-CoV-2, NAA: NOT DETECTED

## 2019-04-29 ENCOUNTER — Inpatient Hospital Stay (HOSPITAL_COMMUNITY)
Admission: AD | Admit: 2019-04-29 | Discharge: 2019-04-29 | Disposition: A | Discharge status: Home / Self Care | Payer: Medicaid Other | Attending: Obstetrics and Gynecology | Admitting: Obstetrics and Gynecology

## 2019-04-29 ENCOUNTER — Other Ambulatory Visit: Payer: Self-pay

## 2019-04-29 ENCOUNTER — Encounter (HOSPITAL_COMMUNITY): Payer: Self-pay

## 2019-04-29 DIAGNOSIS — R1084 Generalized abdominal pain: Secondary | ICD-10-CM

## 2019-04-29 DIAGNOSIS — N939 Abnormal uterine and vaginal bleeding, unspecified: Secondary | ICD-10-CM | POA: Insufficient documentation

## 2019-04-29 DIAGNOSIS — R102 Pelvic and perineal pain: Secondary | ICD-10-CM | POA: Insufficient documentation

## 2019-04-29 DIAGNOSIS — M545 Low back pain: Secondary | ICD-10-CM | POA: Insufficient documentation

## 2019-04-29 DIAGNOSIS — Z3202 Encounter for pregnancy test, result negative: Secondary | ICD-10-CM | POA: Diagnosis not present

## 2019-04-29 LAB — POCT PREGNANCY, URINE: Preg Test, Ur: NEGATIVE

## 2019-04-29 NOTE — Progress Notes (Addendum)
Patient [name] is a 28 y.o. G3P2002 At Unknown here with heavy bleeding for the past 12 days. She has not had this pain before. Her bleeding has tapered off, but she reports pain in her pelvis and lower back. The pain is accompanied by nausea. She was told by [facility] to come to [facility]U.  Patient is well-appearing, alert and oriented times 4.   -Patient's UPT was negative -Advised patient that she could go to the [facility] if she feels weak or dizzy, or starts to have heavy bleeding.  -Patient states that [facility] knows about her complaints and that they will try to see her this week.  -Patient verbalized understanding of return precautions to [place].   Maye Hides

## 2019-04-29 NOTE — MAU Note (Signed)
[name] is a 28 y.o. here in MAU reporting: called her OBGYN at [facility] and told them she has had her period for 12 days straight and it was heavy and with lots of abdominal and back pain. States she has been nauseated and unable to eat or drink anything. Has not had any bleeding today but is having pain. Neg UPT about 2 weeks ago.  LMP: been on period for 12 days  Onset of complaint: ongoing  Pain score: abdominal pain 4/10, back pain 4/10  Vitals:   04/29/19 1011  BP: (!) 131/50  Pulse: 79  Resp: 17  Temp: 98.3 F (36.8 C)  SpO2: 99%     Lab orders placed from triage: UPT

## 2022-09-08 ENCOUNTER — Ambulatory Visit: Payer: Self-pay | Admitting: Internal Medicine

## 2022-09-08 ENCOUNTER — Encounter: Payer: Self-pay | Admitting: Internal Medicine

## 2022-09-08 VITALS — BP 102/60 | HR 74 | Temp 98.2°F | Resp 16 | Ht 63.0 in | Wt 159.8 lb

## 2022-09-08 DIAGNOSIS — R300 Dysuria: Secondary | ICD-10-CM

## 2022-09-08 HISTORY — DX: Dysuria: R30.0

## 2022-09-08 MED ORDER — CEPHALEXIN 500 MG PO CAPS: 500.0000 mg | ORAL_CAPSULE | Freq: Two times a day (BID) | ORAL | 0 refills | Status: AC

## 2022-09-08 MED ORDER — CEPHALEXIN 500 MG PO CAPS: 500.0000 mg | ORAL_CAPSULE | Freq: Four times a day (QID) | ORAL | 0 refills | Status: DC

## 2022-09-08 NOTE — Assessment & Plan Note (Signed)
She does not have insurance.  I am going to hold off on STD testing and she denies any vaginal symptoms for yeast infection.  I will treat her with a 3 day course of keflex.

## 2022-09-08 NOTE — Progress Notes (Signed)
Office Visit  Subjective   Patient ID: [name] [facility]   DOB: March 28, 1991   Age: 32 y.o.   MRN: [id number]   Chief Complaint Chief Complaint  Patient presents with   Acute Visit    UTI     History of Present Illness Mrs. Brager is a 32 yo female who comes in with urinary complaints where she feels she has a UTI.  These symptoms started 1-2 weeks ago where she has dysuria, increased urinary frequency with urgency, with nausea and some suprapubic tenderness.  She denies any fevers, chills, vomiting, new low back pain, hematuria, vaginal discharge or other problems.  She has been drinking cranberry juice and water which helps but then her symptoms come back.  She has not tried anything OTC.  She does not believe this is a vaginal yeast infection. She states that she has been seen in the past by OB/GYN where her urinanalysis has been normal but they still treat her and have sent culture out in the past that were positive.     Past Medical History Past Medical History:  Diagnosis Date   Anxiety    Bipolar 1 disorder (HCC)    Bipolar disorder (HCC)    Costochondritis    Depression    Endometriosis    History of kidney stones    PONV (postoperative nausea and vomiting)      Allergies Allergies  Allergen Reactions   Phenergan [Promethazine Hcl] Other (See Comments)    Reaction:  Shaking    Promethazine Other (See Comments)    "body tremors"     Medications  Current Outpatient Medications:    clonazePAM (KLONOPIN) 0.5 MG tablet, Take 0.5 mg by mouth 2 (two) times daily., Disp: , Rfl:    lamoTRIgine (LAMICTAL) 25 MG tablet, Take 1 tablet (25 mg total) by mouth 2 (two) times daily. For mood stabilization, Disp: 60 tablet, Rfl: 0   lamoTRIgine (LAMICTAL) 25 MG tablet, Take 2 tablets (50 mg total) by mouth daily., Disp: 60 tablet, Rfl: 0   Prenatal Multivit-Min-Fe-FA (PRE-NATAL FORMULA) TABS, Take by mouth., Disp: , Rfl:    Review of Systems Review of Systems  Constitutional:   Negative for chills and fever.  Respiratory:  Negative for shortness of breath.   Cardiovascular:  Negative for chest pain, palpitations and leg swelling.  Gastrointestinal:  Negative for abdominal pain, constipation, diarrhea, nausea and vomiting.  Genitourinary:  Positive for dysuria, frequency and urgency. Negative for hematuria.  Musculoskeletal:  Negative for myalgias.  Neurological:  Negative for dizziness, weakness and headaches.       Objective:    Vitals BP 102/60   Pulse 74   Temp 98.2 F (36.8 C)   Resp 16   Ht 5\' 3"  (1.6 m)   Wt 159 lb 12.8 oz (72.5 kg)   SpO2 97%   BMI 28.31 kg/m    Physical Examination Physical Exam Constitutional:      Appearance: Normal appearance. She is not ill-appearing.  Cardiovascular:     Rate and Rhythm: Normal rate and regular rhythm.     Pulses: Normal pulses.     Heart sounds: No murmur heard.    No friction rub. No gallop.  Pulmonary:     Effort: Pulmonary effort is normal. No respiratory distress.     Breath sounds: No wheezing, rhonchi or rales.  Abdominal:     General: Bowel sounds are normal. There is no distension.     Palpations: Abdomen is soft.  Tenderness: There is no abdominal tenderness.  Musculoskeletal:     Right lower leg: No edema.     Left lower leg: No edema.  Skin:    General: Skin is warm and dry.     Findings: No rash.  Neurological:     Mental Status: She is alert.        Assessment & Plan:   Dysuria She does not have insurance.  I am going to hold off on STD testing and she denies any vaginal symptoms for yeast infection.  I will treat her with a 3 day course of keflex.    No follow-ups on file.   Crist Fat, MD

## 2022-11-06 ENCOUNTER — Ambulatory Visit: Payer: Self-pay | Admitting: Student

## 2022-11-06 ENCOUNTER — Encounter: Payer: Self-pay | Admitting: Student

## 2022-11-06 VITALS — BP 112/74 | HR 79 | Temp 98.1°F | Resp 18 | Ht 63.0 in | Wt 150.8 lb

## 2022-11-06 DIAGNOSIS — F322 Major depressive disorder, single episode, severe without psychotic features: Secondary | ICD-10-CM

## 2022-11-06 MED ORDER — CLONAZEPAM 1 MG PO TABS: 1.0000 mg | ORAL_TABLET | Freq: Two times a day (BID) | ORAL | 1 refills | Status: DC | PRN

## 2022-11-06 NOTE — Assessment & Plan Note (Signed)
I have personally reviewed the [facility] registry for controlled substance.  I will refill clonazepam 1 mg to take 2 times daily as needed.  She has been instructed to seek out further medication management from psychiatry.  [facility] was suggested as well.  She has been strongly advised to not use any alcohol or illicit drugs with this medication.  At the next appointment the UDS should be collected.

## 2022-11-06 NOTE — Progress Notes (Signed)
Office Visit  Subjective   Patient ID: [name]   DOB: 1990/07/09   Age: 32 y.o.   MRN: [id number]   Chief Complaint Chief Complaint  Patient presents with   Follow-up     History of Present Illness [name] is a 32 year female is here for a 3 month follow up with a history of bipolar 1 disorder, and major depressive disorder.  At this time she is tearful and states " I am under a great deal of stress and I feel awful.". She has been a patient with [facility], where a provider has managed her psychiatric medications.  Today she reports not being able to refill prescription for clonazepam and caplyta since May.  She is displeased and reports calling up to the practice daily leaving voicemail's without returned calls. She reports feeling like she is withdrawing from clonazepam, last dose was approximately 1 month ago.  There is reports of increased anxiety, clamy cold feeling, insomnia, nausea and vomiting.  She denies illicit substance abuse, suicidal ideations, hopelessness, or worthlessness.      Past Medical History Past Medical History:  Diagnosis Date   Anxiety    Bipolar 1 disorder (HCC)    Bipolar disorder (HCC)    Costochondritis    Depression    Endometriosis    History of kidney stones    PONV (postoperative nausea and vomiting)      Allergies Allergies  Allergen Reactions   Phenergan [Promethazine Hcl] Other (See Comments)    Reaction:  Shaking    Tilactase    Promethazine Other (See Comments)    "body tremors"  Reaction: Tremor (intolerance)   "body tremors"  Reaction:  Shaking     Medications  Current Outpatient Medications:    desvenlafaxine (PRISTIQ) 50 MG 24 hr tablet, Take 50 mg by mouth at bedtime., Disp: , Rfl:    lamoTRIgine (LAMICTAL) 150 MG tablet, Take 150 mg by mouth daily., Disp: , Rfl:    clonazePAM (KLONOPIN) 1 MG tablet, Take 1 tablet (1 mg total) by mouth 2 (two) times daily as needed for anxiety., Disp: 30  tablet, Rfl: 1   ondansetron (ZOFRAN-ODT) 4 MG disintegrating tablet, Take 1 tablet by mouth 3 (three) times daily., Disp: , Rfl:    Prenatal Multivit-Min-Fe-FA (PRE-NATAL FORMULA) TABS, Take by mouth., Disp: , Rfl:    Review of Systems Review of Systems  Constitutional: Negative.   Eyes: Negative.   Respiratory: Negative.    Cardiovascular: Negative.   Gastrointestinal: Negative.   Musculoskeletal: Negative.   Skin: Negative.   Neurological:  Positive for tremors and headaches. Negative for loss of consciousness and weakness.  Psychiatric/Behavioral:  Positive for depression. Negative for hallucinations, substance abuse and suicidal ideas. The patient is nervous/anxious and has insomnia.        Objective:    Vitals BP 112/74 (BP Location: Right Arm, Patient Position: Sitting, Cuff Size: Normal)   Pulse 79   Temp 98.1 F (36.7 C) (Temporal)   Resp 18   Ht 5\' 3"  (1.6 m)   Wt 150 lb 12.8 oz (68.4 kg)   SpO2 98%   BMI 26.71 kg/m    Physical Examination Physical Exam Cardiovascular:     Rate and Rhythm: Normal rate and regular rhythm.     Pulses: Normal pulses.     Heart sounds: Normal heart sounds.  Pulmonary:     Effort: Pulmonary effort is normal.     Breath sounds: Normal breath sounds.  Abdominal:  General: Abdomen is flat.     Palpations: Abdomen is soft.  Skin:    General: Skin is warm and dry.  Neurological:     Mental Status: She is alert and oriented to person, place, and time.  Psychiatric:        Mood and Affect: Mood is anxious. Affect is tearful.        Speech: Speech normal.        Behavior: Behavior is cooperative.        Thought Content: Thought content normal.        Assessment & Plan:   Major depressive disorder, single episode, severe without psychosis (HCC) I have personally reviewed the [facility] registry for controlled substance.  I will refill clonazepam 1 mg to take 2 times daily as needed.  She has been instructed to seek out  further medication management from psychiatry.  [facility] was suggested as well.  She has been strongly advised to not use any alcohol or illicit drugs with this medication.  At the next appointment the UDS should be collected.    No follow-ups on file.   Edwena Blow, NP

## 2022-11-10 ENCOUNTER — Telehealth: Payer: Self-pay

## 2022-11-10 NOTE — Telephone Encounter (Signed)
Pt called in yesterday asking about clonazepam refill stating that she needs the 60 tablets. Confirmed with Merry Proud, NP that we were refilling only 30 tablets because it is only a PRN medication that should not be used daily. Informed pt and she was not pleased and states that she uses it daily and will be done with them in 2 weeks. Pt was frustrated and stated that she would not be coming back to the office and hung up.

## 2022-12-04 ENCOUNTER — Ambulatory Visit: Payer: Self-pay | Admitting: Student

## 2022-12-11 ENCOUNTER — Encounter: Payer: Self-pay | Admitting: Student

## 2022-12-11 ENCOUNTER — Ambulatory Visit: Payer: Self-pay | Admitting: Student

## 2022-12-11 VITALS — BP 116/74 | HR 93 | Temp 98.7°F | Resp 18 | Ht 63.0 in | Wt 156.4 lb

## 2022-12-11 DIAGNOSIS — F322 Major depressive disorder, single episode, severe without psychotic features: Secondary | ICD-10-CM

## 2022-12-11 DIAGNOSIS — F319 Bipolar disorder, unspecified: Secondary | ICD-10-CM

## 2022-12-11 DIAGNOSIS — Z79899 Other long term (current) drug therapy: Secondary | ICD-10-CM

## 2022-12-11 DIAGNOSIS — Z5181 Encounter for therapeutic drug level monitoring: Secondary | ICD-10-CM

## 2022-12-11 LAB — POCT URINE DRUG SCREEN
POC Amphetamine UR: NOT DETECTED
POC BENZODIAZEPINES UR: NOT DETECTED
POC Barbiturate UR: NOT DETECTED
POC Cocaine UR: NOT DETECTED
POC Ecstasy UR: NOT DETECTED
POC Marijuana UR: POSITIVE — AB
POC Methadone UR: NOT DETECTED — AB
POC Methamphetamine UR: NOT DETECTED
POC Opiate Ur: NOT DETECTED
POC Oxycodone UR: NOT DETECTED
POC PHENCYCLIDINE UR: NOT DETECTED
POC TRICYCLICS UR: NOT DETECTED

## 2022-12-11 MED ORDER — CLONAZEPAM 1 MG PO TABS
1.0000 mg | ORAL_TABLET | Freq: Two times a day (BID) | ORAL | 0 refills | Status: DC | PRN
Start: 2022-12-11 — End: 2023-03-12

## 2022-12-11 NOTE — Patient Instructions (Signed)
[facility] outpatient services:  No referral is required to begin outpatient services. Through our Open Access process, the individual interested in services can call us at [phone] or walk-in to the nearest outpatient office for a virtual, same-day initial assessment to begin services. Open Access is available Monday through Friday from 8 a.m. to 3 p.m. If walking in, note that our offices are closed for lunch from 12 p.m. to 1 p.m.

## 2022-12-11 NOTE — Assessment & Plan Note (Signed)
Same as above

## 2022-12-11 NOTE — Progress Notes (Signed)
Established Patient Office Visit  Subjective   Patient ID: [name], female    DOB: 1991/05/08  Age: 32 y.o. MRN: [id number]  Chief Complaint  Patient presents with   Follow-up    HPI  [name] is a 32 year female is here for a 3 month follow up of MDD and bipolar disorder. Today she expresses the need for therapy and mental health management. She states that she is under a lot of stress due to not being able to work and not having insurance, she also says that she was denied for medicaid and not offered any other resources.   MDD: She reports being on multiple antipsychotics and therapies over the years. Today however she cannot give me specifics medications she is taking and dosages or the last time they were filled. When reviewed with her based on last time filled she says that she just cannot keep up with everything.  Seroquel has not taken in years, she was switched to Caplyta but it is now too expensive to afford. Vrayylar was attempt but switched to Prestiq with  last dose was last week. She states "Clonopin is the only thing that helps with my anxiety."  Previous visit I suggested [facility] but she reports not having a good experience. She says that she is researching different places that are in the area to manage her mental health.   Bipolar: today she reports being in the depressed state with symptoms of over eating, lack energy, frequent crying's spells, she says she has no energy and cries all the time. She denies SI/HI, substance abuse, hallucination, delusions, loss of hope or worthlessness.   I have personally reviewed the NCCSRS recipient query regarding this patient  I did see that she received pregabalin 75 mg #60 from another provider. When asked about this she says the medication was prescribed by a spine and pain specialist and that she is not taking them because they made her sick.  Review of Systems  Cardiovascular: Negative.   Neurological: Negative.    Psychiatric/Behavioral:  Positive for depression. Negative for suicidal ideas. The patient is nervous/anxious and has insomnia.       Objective:     BP 116/74 (BP Location: Left Arm, Patient Position: Sitting, Cuff Size: Normal)   Pulse 93   Temp 98.7 F (37.1 C)   Resp 18   Ht 5\' 3"  (1.6 m)   Wt 156 lb 6 oz (70.9 kg)   SpO2 97%   BMI 27.70 kg/m    Physical Exam Vitals reviewed.  Constitutional:      Appearance: Normal appearance.  Cardiovascular:     Rate and Rhythm: Normal rate and regular rhythm.     Pulses: Normal pulses.     Heart sounds: Normal heart sounds.  Neurological:     General: No focal deficit present.     Mental Status: She is alert.  Psychiatric:        Attention and Perception: Attention normal.        Mood and Affect: Affect is labile.        Speech: Speech normal.        Behavior: Behavior is cooperative.        Thought Content: Thought content is paranoid. Thought content is not delusional. Thought content does not include homicidal or suicidal ideation.        Cognition and Memory: Cognition normal.     Results for orders placed or performed in visit on 12/11/22 (from  the past 24 hour(s))  POCT Urine drug screen     Status: Abnormal   Collection Time: 12/11/22  4:38 PM  Result Value Ref Range   POC Methamphetamine UR None Detected None Detected   POC Opiate Ur None Detected None Detected   POC Barbiturate UR None Detected None Detected   POC Amphetamine UR None Detected None Detected   POC Oxycodone UR None Detected None Detected   POC Cocaine UR None Detected None Detected   POC Ecstasy UR None Detected None Detected   POC TRICYCLICS UR None Detected None Detected   POC PHENCYCLIDINE UR None Detected None Detected   POC Marijuana UR Positive (A) None Detected   POC Methadone UR None Detected (A) None Detected   POC BENZODIAZEPINES UR None Detected None Detected   URINE TEMPERATURE     POC DRUG SCREEN OXIDANTS URINE     POC SPECIFIC  GRAVITY URINE     POC PH URINE     Methylenedioxyamphetamine           Assessment & Plan:   Problem List Items Addressed This Visit     Major depressive disorder, single episode, severe without psychosis (HCC) - Primary    I continue to encourage her to seek out assistance with DSS, [facility] (information provided), and [facility] services.   I will refill prestiq & lamictal with refills and clonazepam 1 mg #30.  I have personally reviewed the NCCSRS recipient query regarding this patient.       Relevant Medications   clonazePAM (KLONOPIN) 1 MG tablet   Other Relevant Orders   POCT Urine drug screen (Completed)   Bipolar 1 disorder (HCC)    Same as above        Return in about 1 month (around 01/11/2023) for f/u.    Edwena Blow, NP

## 2022-12-11 NOTE — Assessment & Plan Note (Addendum)
I continue to encourage her to seek out assistance with DSS, [facility] (information provided), and [facility] services.   I will refill prestiq & lamictal with refills and clonazepam 1 mg #30.  I have personally reviewed the NCCSRS recipient query regarding this patient.

## 2022-12-14 ENCOUNTER — Other Ambulatory Visit: Payer: Self-pay | Admitting: Student

## 2022-12-14 ENCOUNTER — Other Ambulatory Visit: Payer: Self-pay

## 2022-12-14 MED ORDER — LAMOTRIGINE 150 MG PO TABS: 150.0000 mg | ORAL_TABLET | Freq: Every day | ORAL | 2 refills | Status: DC

## 2022-12-14 MED ORDER — DESVENLAFAXINE SUCCINATE ER 50 MG PO TB24: 50.0000 mg | ORAL_TABLET | Freq: Every day | ORAL | 2 refills | Status: DC

## 2023-01-12 ENCOUNTER — Ambulatory Visit: Payer: Self-pay | Admitting: Student

## 2023-01-13 ENCOUNTER — Ambulatory Visit: Payer: Self-pay | Admitting: Internal Medicine

## 2023-02-01 ENCOUNTER — Ambulatory Visit: Payer: Self-pay | Admitting: Internal Medicine

## 2023-03-12 ENCOUNTER — Ambulatory Visit: Payer: Self-pay | Admitting: Internal Medicine

## 2023-03-12 ENCOUNTER — Encounter: Payer: Self-pay | Admitting: Internal Medicine

## 2023-03-12 VITALS — BP 120/82 | HR 84 | Temp 98.5°F | Resp 18 | Ht 63.0 in | Wt 165.0 lb

## 2023-03-12 DIAGNOSIS — R350 Frequency of micturition: Secondary | ICD-10-CM

## 2023-03-12 DIAGNOSIS — F322 Major depressive disorder, single episode, severe without psychotic features: Secondary | ICD-10-CM

## 2023-03-12 HISTORY — DX: Frequency of micturition: R35.0

## 2023-03-12 MED ORDER — QUETIAPINE FUMARATE ER 200 MG PO TB24
200.0000 mg | ORAL_TABLET | Freq: Every day | ORAL | 5 refills | Status: DC
Start: 2023-03-12 — End: 2023-08-20

## 2023-03-12 MED ORDER — LAMOTRIGINE 150 MG PO TABS: 150.0000 mg | ORAL_TABLET | Freq: Every day | ORAL | 5 refills | Status: DC

## 2023-03-12 MED ORDER — CLONAZEPAM 1 MG PO TABS
1.0000 mg | ORAL_TABLET | Freq: Two times a day (BID) | ORAL | 3 refills | Status: DC
Start: 2023-03-12 — End: 2023-08-20

## 2023-03-12 NOTE — Assessment & Plan Note (Signed)
No sign of infection on urine analysis.

## 2023-03-12 NOTE — Assessment & Plan Note (Signed)
I will restart her with Seroquel 200 mg daily, continue with the Lamictal 150 mg daily and clonazepam 1 mg twice a day.  She has a job starting this month but they are not offering her any insurance.  We will reevaluate and may adjust her medications.

## 2023-03-12 NOTE — Progress Notes (Signed)
   Office Visit  Subjective   Patient ID: [name]   DOB: 1990/06/28   Age: 32 y.o.   MRN: [id number]   Chief Complaint Chief Complaint  Patient presents with   Urinary Tract Infection    Follow up UTI     History of Present Illness 32 years old female with a history of bipolar disorder with depression.  She says that she has no money and no job, she cannot go to see psychiatrist because of the cost issue.  She has been getting clonazepam 1 mg twice a day and was getting Seroquel for bipolar disorder.  She says that she is getting panic attacks and she is depressed do not know what to do.  She has 3 small children and barely making her living.  She denies having any suicidal ideation.  Also complaining of reproducible chest pain in 1 spot and says that she is not sure what is going on with her.  She is also complaining of frequent and burning urination.  Her urine analysis has trace of leukocyte esterase in our office.  No blood.  Past Medical History Past Medical History:  Diagnosis Date   Anxiety    Bipolar 1 disorder (HCC)    Bipolar disorder (HCC)    Costochondritis    Depression    Endometriosis    History of kidney stones    PONV (postoperative nausea and vomiting)      Allergies Allergies  Allergen Reactions   Phenergan [Promethazine Hcl] Other (See Comments)    Reaction:  Shaking    Tilactase    Promethazine Other (See Comments)    "body tremors"  Reaction: Tremor (intolerance)   "body tremors"  Reaction:  Shaking     Review of Systems Review of Systems  Constitutional: Negative.   Cardiovascular:  Positive for chest pain.  Neurological: Negative.   Psychiatric/Behavioral:  Positive for depression. The patient is nervous/anxious.        Objective:    Vitals BP 120/82 (BP Location: Left Arm, Patient Position: Sitting, Cuff Size: Normal)   Pulse 84   Temp 98.5 F (36.9 C)   Resp 18   Ht 5\' 3"  (1.6 m)   Wt 165 lb (74.8 kg)   SpO2 96%    BMI 29.23 kg/m    Physical Examination Physical Exam Constitutional:      Appearance: Normal appearance.  Cardiovascular:     Rate and Rhythm: Normal rate and regular rhythm.     Heart sounds: Normal heart sounds.  Pulmonary:     Effort: Pulmonary effort is normal.     Breath sounds: Normal breath sounds.  Neurological:     General: No focal deficit present.     Mental Status: She is alert and oriented to person, place, and time.        Assessment & Plan:   Major depressive disorder, single episode, severe without psychosis (HCC) I will restart her with Seroquel 200 mg daily, continue with the Lamictal 150 mg daily and clonazepam 1 mg twice a day.  She has a job starting this month but they are not offering her any insurance.  We will reevaluate and may adjust her medications.  Urinary frequency No sign of infection on urine analysis.    Return in about 1 month (around 04/11/2023).   Eloisa Northern, MD

## 2023-04-12 ENCOUNTER — Ambulatory Visit: Payer: Self-pay | Admitting: Internal Medicine

## 2023-08-20 ENCOUNTER — Ambulatory Visit: Payer: Self-pay | Admitting: Internal Medicine

## 2023-08-20 ENCOUNTER — Encounter: Payer: Self-pay | Admitting: Internal Medicine

## 2023-08-20 VITALS — BP 120/78 | HR 77 | Temp 98.4°F | Resp 18 | Ht 63.0 in | Wt 164.0 lb

## 2023-08-20 DIAGNOSIS — F322 Major depressive disorder, single episode, severe without psychotic features: Secondary | ICD-10-CM

## 2023-08-20 DIAGNOSIS — R000 Tachycardia, unspecified: Secondary | ICD-10-CM

## 2023-08-20 DIAGNOSIS — F3130 Bipolar disorder, current episode depressed, mild or moderate severity, unspecified: Secondary | ICD-10-CM

## 2023-08-20 HISTORY — DX: Tachycardia, unspecified: R00.0

## 2023-08-20 MED ORDER — PROPRANOLOL HCL 20 MG PO TABS: 20.0000 mg | ORAL_TABLET | Freq: Two times a day (BID) | ORAL | 6 refills | Status: DC

## 2023-08-20 MED ORDER — CLONAZEPAM 1 MG PO TABS: 1.0000 mg | ORAL_TABLET | Freq: Two times a day (BID) | ORAL | 3 refills | Status: DC

## 2023-08-20 MED ORDER — QUETIAPINE FUMARATE 50 MG PO TABS: 50.0000 mg | ORAL_TABLET | Freq: Every day | ORAL | 3 refills | Status: DC

## 2023-08-20 MED ORDER — LAMOTRIGINE 150 MG PO TABS: 150.0000 mg | ORAL_TABLET | Freq: Every day | ORAL | 5 refills | Status: DC

## 2023-08-20 NOTE — Assessment & Plan Note (Signed)
 She will take propranolol 20 mg twice a day

## 2023-08-20 NOTE — Assessment & Plan Note (Signed)
 I will restart seroquil 50 mg daily and she will continue with lamictal as mood stablizer

## 2023-08-20 NOTE — Progress Notes (Signed)
   Office Visit  Subjective   Patient ID: [name]   DOB: 1990-09-13   Age: 33 y.o.   MRN: [id number]   Chief Complaint Chief Complaint  Patient presents with   Medication Refill     History of Present Illness 33 years old female who is here for follow up. She says that she could not come early because of lack of money. She says that seroquil make her drowsy in the morning and she has special need child so she stopped taking this medicine. She use to take seroquil 400 mg before. She also take lamictal as mood  stabilizer. She has history of bipolar disorder with depression. She denies having any suicidal ideation.   She also says that she has palpitations and increase heart rate, she use to take propranolol for increase heart rate but is not taking any medicine for it.  She says that she is getting panic attacks and takes clonazepam 1 mg as needed for it.   Also complaining of reproducible chest pain in 1 spot and says that she is not sure what is going on with her.   She is also complaining of frequent and burning urination.  Her urine analysis has trace of leukocyte esterase in our office.  No blood.  Past Medical History Past Medical History:  Diagnosis Date   Anxiety    Bipolar 1 disorder (HCC)    Bipolar disorder (HCC)    Costochondritis    Depression    Endometriosis    History of kidney stones    PONV (postoperative nausea and vomiting)      Allergies Allergies  Allergen Reactions   Phenergan [Promethazine Hcl] Other (See Comments)    Reaction:  Shaking    Tilactase    Promethazine Other (See Comments)    "body tremors"  Reaction: Tremor (intolerance)   "body tremors"  Reaction:  Shaking     Review of Systems Review of Systems  Constitutional: Negative.   HENT: Negative.    Respiratory: Negative.    Cardiovascular: Negative.   Gastrointestinal: Negative.        Objective:    Vitals BP 120/78 (BP Location: Left Arm, Patient Position:  Sitting, Cuff Size: Normal)   Pulse 77   Temp 98.4 F (36.9 C)   Resp 18   Ht 5\' 3"  (1.6 m)   Wt 164 lb (74.4 kg)   SpO2 98%   BMI 29.05 kg/m    Physical Examination Physical Exam Constitutional:      Appearance: Normal appearance.  HENT:     Head: Normocephalic and atraumatic.  Cardiovascular:     Rate and Rhythm: Normal rate and regular rhythm.     Heart sounds: Normal heart sounds.  Pulmonary:     Effort: Pulmonary effort is normal.     Breath sounds: Normal breath sounds.  Neurological:     General: No focal deficit present.     Mental Status: She is alert.        Assessment & Plan:   Tachycardia She will take propranolol 20 mg twice a day  Bipolar I disorder, most recent episode depressed (HCC) I will restart seroquil 50 mg daily and she will continue with lamictal as mood stablizer    Return in about 3 months (around 11/19/2023).   Tina Northern, MD

## 2023-09-17 ENCOUNTER — Ambulatory Visit: Payer: Self-pay | Admitting: Internal Medicine

## 2023-09-20 ENCOUNTER — Encounter: Payer: Self-pay | Admitting: Internal Medicine

## 2023-09-20 ENCOUNTER — Ambulatory Visit: Payer: Self-pay | Admitting: Internal Medicine

## 2023-09-20 VITALS — BP 122/80 | HR 73 | Temp 98.3°F | Resp 18 | Ht 63.0 in | Wt 164.5 lb

## 2023-09-20 DIAGNOSIS — R000 Tachycardia, unspecified: Secondary | ICD-10-CM

## 2023-09-20 DIAGNOSIS — F319 Bipolar disorder, unspecified: Secondary | ICD-10-CM

## 2023-09-20 MED ORDER — PROPRANOLOL HCL 40 MG PO TABS: 40.0000 mg | ORAL_TABLET | Freq: Two times a day (BID) | ORAL | 6 refills | Status: AC

## 2023-09-20 NOTE — Progress Notes (Signed)
   Office Visit  Subjective   Patient ID: [name]   DOB: 1990/10/09   Age: 33 y.o.   MRN: [id number]   Chief Complaint Chief Complaint  Patient presents with   Medication change    Office visit     History of Present Illness 33 years old female with history of bipolar depression who used to see psychiatristis  and was taking Pristiq , Lamictal  and rale are but she lost insurance.  She was started on Seroquel  50 mg daily and Lamictal  150 mg daily along with clonazepam  1 mg twice a day.  She says that she does not feel good with Seroquel .  She is irritable and feel groggy.  She could not tolerate you would on either because of paranoid thoughts.  She says that she sleep okay but she feel depressed.  She wanted to change Seroquel .  She does not have insurance  So cannot go to see psychiatrist.  She denies having any suicidal ideation.  She has 2 kids who are artistic 75 is 68 years old.    She has tachycardia and I have started her on propranolol  20 mg twice a day.  Her heart rate is better but she says that the movement she walk on a treadmill her heart rate goes to 170.    She says that she is getting panic attacks and takes clonazepam  1 mg as needed for it.      Past Medical History Past Medical History:  Diagnosis Date   Anxiety    Bipolar 1 disorder (HCC)    Bipolar disorder (HCC)    Costochondritis    Depression    Endometriosis    History of kidney stones    PONV (postoperative nausea and vomiting)      Allergies Allergies  Allergen Reactions   Phenergan  [Promethazine  Hcl] Other (See Comments)    Reaction:  Shaking    Tilactase    Promethazine  Other (See Comments)    "body tremors"  Reaction: Tremor (intolerance)   "body tremors"  Reaction:  Shaking     Review of Systems Review of Systems  Cardiovascular:  Negative for chest pain.  Psychiatric/Behavioral:  Positive for depression.        Objective:    Vitals BP 122/80 (BP Location: Left Arm,  Patient Position: Sitting, Cuff Size: Normal)   Pulse 73   Temp 98.3 F (36.8 C)   Resp 18   Ht 5\' 3"  (1.6 m)   Wt 164 lb 8 oz (74.6 kg)   SpO2 99%   BMI 29.14 kg/m    Physical Examination Physical Exam Constitutional:      Appearance: Normal appearance.  Cardiovascular:     Rate and Rhythm: Normal rate and regular rhythm.     Heart sounds: Normal heart sounds.  Pulmonary:     Effort: Pulmonary effort is normal.     Breath sounds: Normal breath sounds.  Neurological:     Mental Status: She is alert.        Assessment & Plan:   Bipolar depression (HCC)  She will continue with Lamictal  150 mg daily, clonazepam  1 mg twice a day and Seroquel  for now.  I will discussed with psychiatrist and then will readjust her medication.  Tachycardia   I will increase the dose of propranolol  to 40 mg twice a day.    Return in about 1 month (around 10/21/2023).   Tita Form, MD

## 2023-09-20 NOTE — Assessment & Plan Note (Signed)
 She will continue with Lamictal  150 mg daily, clonazepam  1 mg twice a day and Seroquel  for now.  I will discussed with psychiatrist and then will readjust her medication.

## 2023-09-20 NOTE — Assessment & Plan Note (Signed)
 I will increase the dose of propranolol  to 40 mg twice a day.

## 2023-10-18 ENCOUNTER — Ambulatory Visit: Payer: Self-pay | Admitting: Internal Medicine

## 2023-10-20 DIAGNOSIS — Z0289 Encounter for other administrative examinations: Secondary | ICD-10-CM

## 2023-11-19 ENCOUNTER — Ambulatory Visit: Payer: Self-pay | Admitting: Internal Medicine

## 2023-12-01 ENCOUNTER — Ambulatory Visit: Payer: Self-pay | Admitting: Internal Medicine

## 2023-12-01 ENCOUNTER — Encounter: Payer: Self-pay | Admitting: Internal Medicine

## 2023-12-01 VITALS — BP 120/78 | HR 80 | Temp 98.7°F | Resp 18 | Ht 63.0 in | Wt 148.0 lb

## 2023-12-01 DIAGNOSIS — F3130 Bipolar disorder, current episode depressed, mild or moderate severity, unspecified: Secondary | ICD-10-CM

## 2023-12-01 DIAGNOSIS — F419 Anxiety disorder, unspecified: Secondary | ICD-10-CM | POA: Insufficient documentation

## 2023-12-01 DIAGNOSIS — F32A Depression, unspecified: Secondary | ICD-10-CM | POA: Insufficient documentation

## 2023-12-01 DIAGNOSIS — F322 Major depressive disorder, single episode, severe without psychotic features: Secondary | ICD-10-CM

## 2023-12-01 DIAGNOSIS — N809 Endometriosis, unspecified: Secondary | ICD-10-CM | POA: Insufficient documentation

## 2023-12-01 DIAGNOSIS — R000 Tachycardia, unspecified: Secondary | ICD-10-CM

## 2023-12-01 DIAGNOSIS — Z87442 Personal history of urinary calculi: Secondary | ICD-10-CM | POA: Insufficient documentation

## 2023-12-01 DIAGNOSIS — Z9889 Other specified postprocedural states: Secondary | ICD-10-CM | POA: Insufficient documentation

## 2023-12-01 DIAGNOSIS — M940 Chondrocostal junction syndrome [Tietze]: Secondary | ICD-10-CM | POA: Insufficient documentation

## 2023-12-01 MED ORDER — LAMOTRIGINE 150 MG PO TABS: 150.0000 mg | ORAL_TABLET | Freq: Every day | ORAL | 5 refills | Status: DC

## 2023-12-01 MED ORDER — CLONAZEPAM 1 MG PO TABS: 1.0000 mg | ORAL_TABLET | Freq: Two times a day (BID) | ORAL | 3 refills | Status: DC

## 2023-12-01 MED ORDER — ESCITALOPRAM OXALATE 10 MG PO TABS: 10.0000 mg | ORAL_TABLET | Freq: Every day | ORAL | 3 refills | Status: DC

## 2023-12-01 NOTE — Assessment & Plan Note (Addendum)
 She has stop Seroquel .  She will continue with Lamictal  150 mg daily and clonazepam  1 mg twice a day.  She was getting escitalopram  10 mg daily and I will restart that.  She is not pregnant and understand that she cannot take these medications while pregnant.

## 2023-12-01 NOTE — Assessment & Plan Note (Signed)
 She has normal sinus rhythm on previous EKG.  Her heart rate is better in our office.  I will continue with propranolol  40 mg twice a day and I will refer her to see cardiologist for evaluation.

## 2023-12-01 NOTE — Progress Notes (Signed)
   Office Visit  Subjective   Patient ID: [name]   DOB: 1990/11/30   Age: 33 y.o.   MRN: [id number]   Chief Complaint Chief Complaint  Patient presents with   Follow-up    Follow up     History of Present Illness   33 years old female with history of bipolar disorder,  anxiety and depression is here complaining that she cannot do anything at home because her heart rate increased to above 150 beat/minute.  She feel dizzy and lightheaded.  She says that her kids want to play with her but she cannot play with them because her heart rate shoot up and she started having dizziness and lightheadedness.  I have started her on propranolol  but she says that it is not helping.  No shortness of breath.    She also has a bipolar disorder with depression and she says that she has stopped taking Seroquel  because that will make her sleepy and groggy.  She used to take escitalopram  along with Lamictal  and that combination was helping her more and she wanted to see if I can restart that back.  She denies any suicidal ideation.  Past Medical History Past Medical History:  Diagnosis Date   Anxiety    Bipolar 1 disorder (HCC)    Bipolar disorder (HCC)    Costochondritis    Depression    Endometriosis    History of kidney stones    PONV (postoperative nausea and vomiting)      Allergies Allergies  Allergen Reactions   Phenergan  [Promethazine  Hcl] Other (See Comments)    Reaction:  Shaking    Tilactase    Promethazine  Other (See Comments)    body tremors  Reaction: Tremor (intolerance)   body tremors  Reaction:  Shaking     Review of Systems ROS     Objective:    Vitals BP 120/78   Pulse 80   Temp 98.7 F (37.1 C)   Resp 18   Ht 5' 3 (1.6 m)   Wt 148 lb (67.1 kg)   SpO2 99%   BMI 26.22 kg/m    Physical Examination Physical Exam     Assessment & Plan:   Tachycardia   She has normal sinus rhythm on previous EKG.  Her heart rate is better in our office.  I  will continue with propranolol  40 mg twice a day and I will refer her to see cardiologist for evaluation.  Bipolar I disorder, most recent episode depressed (HCC)   She has stop Seroquel .  She will continue with Lamictal  150 mg daily and clonazepam  1 mg twice a day.  She was getting escitalopram  10 mg daily and I will restart that.  She is not pregnant and understand that she cannot take these medications while pregnant.    Return in about 3 months (around 03/02/2024).   Roetta Dare, MD

## 2023-12-02 ENCOUNTER — Encounter: Payer: Self-pay | Admitting: Cardiology

## 2023-12-02 ENCOUNTER — Other Ambulatory Visit: Payer: Self-pay

## 2023-12-02 ENCOUNTER — Ambulatory Visit: Payer: Self-pay | Attending: Cardiology | Admitting: Cardiology

## 2023-12-02 VITALS — BP 106/70 | HR 76 | Ht 63.0 in | Wt 146.4 lb

## 2023-12-02 DIAGNOSIS — R000 Tachycardia, unspecified: Secondary | ICD-10-CM

## 2023-12-02 DIAGNOSIS — R079 Chest pain, unspecified: Secondary | ICD-10-CM | POA: Insufficient documentation

## 2023-12-02 MED ORDER — IVABRADINE HCL 5 MG PO TABS: 5.0000 mg | ORAL_TABLET | Freq: Two times a day (BID) | ORAL | 3 refills | Status: AC

## 2023-12-02 NOTE — Progress Notes (Signed)
 Cardiology Office Note:    Date:  12/02/2023   ID:  [name], DOB 1990/08/03, MRN [id number]  PCP:  Caleen Dirks, MD  Cardiologist:  Jennifer JONELLE Crape, MD   Referring MD: Caleen Dirks, MD    ASSESSMENT:    1. Tachycardia   2. Chest pain of uncertain etiology    PLAN:    In order of problems listed above:  Primary prevention stressed with the patient.  Importance of compliance with diet medication stressed and patient verbalized standing. Chest pain: Atypical in nature but in view of her concerns we will do a stress echo.  She is agreeable. Cardiac murmur: Echocardiogram will be done to assess murmur heard on auscultation. Palpitations and tachycardia: For this we will initiate her on low-dose Corlanor and see how she feels.  She mentions to me that there is no chance of her being pregnant as she has had bilateral tubal ligation. Patient will be seen in follow-up appointment in 6 months or earlier if the patient has any concerns.    Medication Adjustments/Labs and Tests Ordered: Current medicines are reviewed at length with the patient today.  Concerns regarding medicines are outlined above.  Orders Placed This Encounter  Procedures   EKG 12-Lead   No orders of the defined types were placed in this encounter.    History of Present Illness:    [name] is a 33 y.o. female who is being seen today for the evaluation of chest pain and palpitations at the request of Caleen Dirks, MD. patient is a pleasant 33 year old female.  She has past medical history of palpitations.  She mentions to me that she was started on propranolol  but felt significantly fatigued.  No chest pain orthopnea PND or syncope.  At the time of my evaluation, the patient is alert awake oriented and in no distress.  She mentions to me that her heart rates was in the 150s and 60s with minimal exertion.  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   escitalopram  (LEXAPRO ) 10 MG tablet Take 1 tablet (10 mg total) by mouth daily.   lamoTRIgine  (LAMICTAL ) 150 MG tablet Take 1 tablet (150 mg total) by mouth daily.     Allergies:   Phenergan  [promethazine  hcl], Tilactase, and Promethazine    Social History   Socioeconomic History   Marital status: Married    Spouse name: Not on file   Number of children: Not on file   Years of education: Not on file   Highest education level: Not  on file  Occupational History   Not on file  Tobacco Use   Smoking status: Never   Smokeless tobacco: Never  Vaping Use   Vaping status: Former   Substances: THC, CBD  Substance and Sexual Activity   Alcohol use: Never   Drug use: Never   Sexual activity: Yes    Birth control/protection: Implant  Other Topics Concern   Not on file  Social History Narrative   ** Merged History Encounter **       Social Drivers of Health   Financial Resource Strain: Not on file  Food Insecurity: No Food Insecurity (08/06/2020)   Received from [facility] visits prior to 07/11/2022.    Hunger Vital Sign    Worried About Programme researcher, broadcasting/film/video in the Last Year: Never true    Ran Out of Food in the Last Year: Never true  Transportation Needs: Not on file  Physical Activity: Not on file  Stress: Not on file  Social Connections: Not on file     Family History: The patient's family history includes Alcohol abuse in her father and paternal grandfather; Birth defects in her sister; Cancer in her paternal grandfather; Depression in her father, maternal grandfather, maternal grandmother, mother, paternal grandfather, and paternal grandmother; Drug abuse in her father; Heart disease in her maternal grandfather, maternal grandmother, paternal grandfather, and paternal grandmother; Hypertension in her maternal grandfather, maternal grandmother, paternal grandfather, and paternal grandmother; Mental illness in her father.  ROS:   Please see the history of present illness.    All other systems reviewed and are negative.  EKGs/Labs/Other Studies Reviewed:    The following studies were reviewed today:  EKG Interpretation Date/Time:  Thursday December 02 2023 08:41:13 EDT Ventricular Rate:  76 PR Interval:  106 QRS Duration:  84 QT Interval:  356 QTC Calculation: 400 R Axis:   96  Text Interpretation: Sinus rhythm with short PR Rightward axis Borderline ECG When compared with ECG of 06-Aug-2017 16:22, No significant change was found Confirmed by Edwyna Backers [phone]) on 12/02/2023 9:05:12 AM     Recent Labs: No results found for requested labs within last 365 days.  Recent Lipid Panel    Component Value Date/Time   CHOL 141 11/23/2018 0638   TRIG 99 11/23/2018 0638   HDL 43 11/23/2018 0638   CHOLHDL 3.3 11/23/2018 0638   VLDL 20 11/23/2018 0638   LDLCALC 78 11/23/2018 0638    Physical Exam:    VS:  BP 106/70   Pulse 76   Ht 5' 3 (1.6 m)   Wt 146 lb 6.4 oz (66.4 kg)   SpO2 99%   BMI 25.93 kg/m     Wt Readings from Last 3 Encounters:  12/02/23 146 lb 6.4 oz (66.4  kg)  12/01/23 148 lb (67.1 kg)  09/20/23 164 lb 8 oz (74.6 kg)     GEN: Patient is in no acute distress HEENT: Normal NECK: No JVD; No carotid bruits LYMPHATICS: No lymphadenopathy CARDIAC: S1 S2 regular, 2/6 systolic murmur at the apex. RESPIRATORY:  Clear to auscultation without rales, wheezing or rhonchi  ABDOMEN: Soft, non-tender, non-distended MUSCULOSKELETAL:  No edema; No deformity  SKIN: Warm and dry NEUROLOGIC:  Alert and oriented x 3 PSYCHIATRIC:  Normal affect    Signed, Backers JONELLE Edwyna, MD  12/02/2023 9:16 AM    [facility]

## 2023-12-02 NOTE — Patient Instructions (Signed)
 Medication Instructions:  Your physician has recommended you make the following change in your medication:   START: Corlanor 5 mg two times daily  *If you need a refill on your cardiac medications before your next appointment, please call your pharmacy*  Lab Work: Your physician recommends that you return for lab work in:   Labs today: BMP, CBC, TSH  If you have labs (blood work) drawn today and your tests are completely normal, you will receive your results only by: MyChart Message (if you have MyChart) OR A paper copy in the mail If you have any lab test that is abnormal or we need to change your treatment, we will call you to review the results.  Testing/Procedures: Your physician has requested that you have an echocardiogram. Echocardiography is a painless test that uses sound waves to create images of your heart. It provides your doctor with information about the size and shape of your heart and how well your heart's chambers and valves are working. This procedure takes approximately one hour. There are no restrictions for this procedure. Please do NOT wear cologne, perfume, aftershave, or lotions (deodorant is allowed). Please arrive 15 minutes prior to your appointment time.  Please note: We ask at that you not bring children with you during ultrasound (echo/ vascular) testing. Due to room size and safety concerns, children are not allowed in the ultrasound rooms during exams. Our front office staff cannot provide observation of children in our lobby area while testing is being conducted. An adult accompanying a patient to their appointment will only be allowed in the ultrasound room at the discretion of the ultrasound technician under special circumstances. We apologize for any inconvenience.  Your physician has requested that you have a stress echocardiogram. For further information please visit [email or web address] Please follow instruction sheet as given.  Please note: We ask at  that you not bring children with you during ultrasound (echo/ vascular) testing. Due to room size and safety concerns, children are not allowed in the ultrasound rooms during exams. Our front office staff cannot provide observation of children in our lobby area while testing is being conducted. An adult accompanying a patient to their appointment will only be allowed in the ultrasound room at the discretion of the ultrasound technician under special circumstances. We apologize for any inconvenience.   Follow-Up: At [facility], you and your health needs are our priority.  As part of our continuing mission to provide you with exceptional heart care, our providers are all part of one team.  This team includes your primary Cardiologist (physician) and Advanced Practice Providers or APPs (Physician Assistants and Nurse Practitioners) who all work together to provide you with the care you need, when you need it.  Your next appointment:   9 month(s)  Provider:   Jennifer Crape, MD    We recommend signing up for the patient portal called MyChart.  Sign up information is provided on this After Visit Summary.  MyChart is used to connect with patients for Virtual Visits (Telemedicine).  Patients are able to view lab/test results, encounter notes, upcoming appointments, etc.  Non-urgent messages can be sent to your provider as well.   To learn more about what you can do with MyChart, go to [email or web address].   Other Instructions None

## 2023-12-03 ENCOUNTER — Ambulatory Visit: Payer: Self-pay | Admitting: Cardiology

## 2023-12-03 ENCOUNTER — Ambulatory Visit: Payer: Self-pay | Attending: Cardiology

## 2023-12-03 DIAGNOSIS — R079 Chest pain, unspecified: Secondary | ICD-10-CM

## 2023-12-03 DIAGNOSIS — R000 Tachycardia, unspecified: Secondary | ICD-10-CM

## 2023-12-03 LAB — CBC
Hematocrit: 45.8 % (ref 34.0–46.6)
Hemoglobin: 15.3 g/dL (ref 11.1–15.9)
MCH: 29.5 pg (ref 26.6–33.0)
MCHC: 33.4 g/dL (ref 31.5–35.7)
MCV: 88 fL (ref 79–97)
Platelets: 421 x10E3/uL (ref 150–450)
RBC: 5.18 x10E6/uL (ref 3.77–5.28)
RDW: 12 % (ref 11.7–15.4)
WBC: 7.1 x10E3/uL (ref 3.4–10.8)

## 2023-12-03 LAB — BASIC METABOLIC PANEL WITH GFR
BUN/Creatinine Ratio: 14 (ref 9–23)
BUN: 11 mg/dL (ref 6–20)
CO2: 21 mmol/L (ref 20–29)
Calcium: 9.9 mg/dL (ref 8.7–10.2)
Chloride: 101 mmol/L (ref 96–106)
Creatinine, Ser: 0.81 mg/dL (ref 0.57–1.00)
Glucose: 78 mg/dL (ref 70–99)
Potassium: 4.7 mmol/L (ref 3.5–5.2)
Sodium: 141 mmol/L (ref 134–144)
eGFR: 99 mL/min/1.73 (ref >59)

## 2023-12-03 LAB — TSH: TSH: 0.941 u[IU]/mL (ref 0.450–4.500)

## 2023-12-04 ENCOUNTER — Encounter (HOSPITAL_BASED_OUTPATIENT_CLINIC_OR_DEPARTMENT_OTHER): Payer: Self-pay | Admitting: Emergency Medicine

## 2023-12-04 ENCOUNTER — Ambulatory Visit (HOSPITAL_BASED_OUTPATIENT_CLINIC_OR_DEPARTMENT_OTHER)
Admission: EM | Admit: 2023-12-04 | Discharge: 2023-12-04 | Disposition: A | Discharge status: Home / Self Care | Payer: Self-pay | Attending: Family Medicine | Admitting: Family Medicine

## 2023-12-04 DIAGNOSIS — H1031 Unspecified acute conjunctivitis, right eye: Secondary | ICD-10-CM

## 2023-12-04 DIAGNOSIS — H5711 Ocular pain, right eye: Secondary | ICD-10-CM

## 2023-12-04 LAB — ECHOCARDIOGRAM COMPLETE
AR max vel: 1.87 cm2
AV Area VTI: 2 cm2
AV Area mean vel: 1.94 cm2
AV Mean grad: 4.2 mmHg
AV Peak grad: 8.7 mmHg
Ao pk vel: 1.48 m/s
Area-P 1/2: 4.69 cm2
S' Lateral: 2.8 cm

## 2023-12-04 MED ORDER — NEOMYCIN-POLYMYXIN-DEXAMETH 3.5-10000-0.1 OP SUSP: 2.0000 [drp] | OPHTHALMIC | 0 refills | Status: AC

## 2023-12-04 NOTE — Discharge Instructions (Addendum)
 Conjunctivitis and secondary right eye pain: Warm compresses are helpful.  Rinse eye with warm water.  Maxitrol ophthalmic drops, 2 drops every 4 hours for 5 to 7 days.  Follow-up if symptoms do not improve, worsen or new symptoms occur.

## 2023-12-04 NOTE — ED Triage Notes (Signed)
 Pt woke up on Thursday with right eye swelling she thinks it might be from psoriasis. Pt states her eye feels swollen inside and she is unable to keep it open.

## 2023-12-04 NOTE — ED Provider Notes (Signed)
 [facility]    CSN: [id number] Arrival date & time: 12/04/23  0804      History   Chief Complaint No chief complaint on file.   HPI [name] is a 33 y.o. female.   Patient reports right eye itching and swelling since Thursday, 12/02/2023.  She does have psoriasis and eczema and does have some intermittent chronic itchy watery eyes.  This is gotten so irritated and swollen that she is having trouble opening her right eye.  The discharge is watery not pus.     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   Major depressive disorder, single episode, severe without psychosis (HCC) 06/15/2016   PONV (postoperative nausea and vomiting)    Tachycardia 08/20/2023   Urinary frequency 03/12/2023    Patient Active Problem List   Diagnosis Date Noted   Chest pain of uncertain etiology 12/02/2023   Anxiety    Costochondritis    Depression    Endometriosis    History of kidney stones    PONV (postoperative nausea and vomiting)    Tachycardia 08/20/2023   Urinary frequency 03/12/2023   Dysuria 09/08/2022   Bipolar 1 disorder (HCC) 11/22/2018   Bipolar depression (HCC) 06/26/2016   Bipolar I disorder, most recent episode depressed (HCC)    Major depressive disorder, 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   Major depressive disorder, single episode, severe without psychosis (HCC) 06/15/2016   Normal labor and delivery 11/23/2010    Past Surgical History:  Procedure Laterality Date   APPENDECTOMY     CHOLECYSTECTOMY     LAPAROSCOPY N/A 04/16/2015   Procedure: LAPAROSCOPY DIAGNOSTIC;  Surgeon: Oneil FORBES Piety, MD;   Location: [facility];  Service: Gynecology;  Laterality: N/A;    OB History     Gravida  3   Para  2   Term  2   Preterm  0   AB  0   Living  2      SAB  0   IAB  0   Ectopic  0   Multiple      Live Births  2            Home Medications    Prior to Admission medications   Medication Sig Start Date End Date Taking? Authorizing Provider  clonazePAM  (KLONOPIN ) 1 MG tablet Take 1 tablet (1 mg total) by mouth 2 (two) times daily. 12/01/23  Yes Amin, Saad, MD  escitalopram  (LEXAPRO ) 10 MG tablet Take 1 tablet (10 mg total) by mouth daily. 12/01/23  Yes Caleen Dirks, MD  ivabradine  (CORLANOR) 5 MG TABS tablet Take 1 tablet (5 mg total) by mouth 2 (two) times daily with a meal. 12/02/23  Yes Revankar, Jennifer SAUNDERS, MD  lamoTRIgine  (LAMICTAL ) 150 MG tablet Take 1 tablet (150 mg total) by mouth daily. 12/01/23  Yes Amin, Saad, MD  neomycin -polymyxin b-dexamethasone  (MAXITROL) 3.5-10000-0.1 SUSP Place 2 drops into the right eye every 4 (four) hours. 12/04/23  Yes Ival Domino, FNP  ondansetron  (ZOFRAN -ODT) 4 MG disintegrating tablet Take 1 tablet by mouth 3 (three) times daily. Patient not taking: Reported on 12/02/2023  [provider]  propranolol  (INDERAL ) 40 MG tablet Take 1 tablet (40 mg total) by mouth 2 (two) times daily. Patient not taking: Reported on 12/02/2023 09/20/23 09/19/24  Caleen Dirks, MD    Family History Family History  Problem Relation Age of Onset   Alcohol abuse Father    Depression Father    Mental illness Father    Drug abuse Father    Depression Mother    Birth defects Sister    Depression Maternal Grandmother    Heart disease Maternal Grandmother    Hypertension Maternal Grandmother    Depression Maternal Grandfather    Heart disease Maternal Grandfather    Hypertension Maternal Grandfather    Depression Paternal Grandmother    Heart disease Paternal Grandmother    Hypertension Paternal Grandmother    Depression Paternal Grandfather    Heart  disease Paternal Grandfather    Cancer Paternal Grandfather    Alcohol abuse Paternal Grandfather    Hypertension Paternal Grandfather     Social History Social History   Tobacco Use   Smoking status: Never   Smokeless tobacco: Never  Vaping Use   Vaping status: Former   Substances: THC, CBD  Substance Use Topics   Alcohol use: Never   Drug use: Never     Allergies   Phenergan  [promethazine  hcl], Tilactase, and Promethazine    Review of Systems Review of Systems  Constitutional:  Negative for chills and fever.  HENT:  Negative for ear pain and sore throat.   Eyes:  Positive for pain, discharge, redness and itching. Negative for visual disturbance.  Respiratory:  Negative for cough and shortness of breath.   Cardiovascular:  Negative for chest pain and palpitations.  Gastrointestinal:  Negative for abdominal pain, constipation, diarrhea, nausea and vomiting.  Genitourinary:  Negative for dysuria and hematuria.  Musculoskeletal:  Negative for arthralgias and back pain.  Skin:  Negative for color change and rash.  Neurological:  Negative for seizures and syncope.  All other systems reviewed and are negative.    Physical Exam Triage Vital Signs ED Triage Vitals  Encounter Vitals Group     BP 12/04/23 0815 121/84     Girls Systolic BP Percentile --      Girls Diastolic BP Percentile --      Boys Systolic BP Percentile --      Boys Diastolic BP Percentile --      Pulse Rate 12/04/23 0815 71     Resp 12/04/23 0815 18     Temp 12/04/23 0815 98 F (36.7 C)     Temp Source 12/04/23 0815 Oral     SpO2 12/04/23 0815 98 %     Weight --      Height --      Head Circumference --      Peak Flow --      Pain Score 12/04/23 0814 6     Pain Loc --      Pain Education --      Exclude from Growth Chart --    No data found.  Updated Vital Signs BP 121/84 (BP Location: Right Arm)   Pulse 71   Temp 98 F (36.7 C) (Oral)   Resp 18   LMP 10/26/2023 (Approximate)   SpO2  98%   Visual Acuity Right Eye Distance:   Left Eye Distance:   Bilateral Distance:    Right Eye Near:   Left Eye Near:    Bilateral Near:     Physical Exam Vitals  and nursing note reviewed.  Constitutional:      General: She is not in acute distress.    Appearance: She is well-developed. She is not ill-appearing or toxic-appearing.  HENT:     Head: Normocephalic and atraumatic.     Right Ear: Hearing, tympanic membrane, ear canal and external ear normal.     Left Ear: Hearing, tympanic membrane, ear canal and external ear normal.     Nose: No congestion or rhinorrhea.     Right Sinus: No maxillary sinus tenderness or frontal sinus tenderness.     Left Sinus: No maxillary sinus tenderness or frontal sinus tenderness.     Mouth/Throat:     Lips: Pink.     Mouth: Mucous membranes are moist.     Pharynx: Uvula midline. No oropharyngeal exudate or posterior oropharyngeal erythema.     Tonsils: No tonsillar exudate.  Eyes:     Conjunctiva/sclera:     Right eye: Right conjunctiva is injected. Exudate (Watery) present. No chemosis or hemorrhage.    Pupils: Pupils are equal, round, and reactive to light.     Comments: Swelling of the right upper and lower eyelids  Cardiovascular:     Rate and Rhythm: Normal rate and regular rhythm.     Heart sounds: S1 normal and S2 normal. No murmur heard. Pulmonary:     Effort: Pulmonary effort is normal. No respiratory distress.     Breath sounds: Normal breath sounds. No decreased breath sounds, wheezing, rhonchi or rales.  Abdominal:     General: Bowel sounds are normal.     Palpations: Abdomen is soft.     Tenderness: There is no abdominal tenderness.  Musculoskeletal:        General: No swelling.     Cervical back: Neck supple.  Lymphadenopathy:     Head:     Right side of head: No submental, submandibular, tonsillar, preauricular or posterior auricular adenopathy.     Left side of head: No submental, submandibular, tonsillar,  preauricular or posterior auricular adenopathy.     Cervical: No cervical adenopathy.     Right cervical: No superficial cervical adenopathy.    Left cervical: No superficial cervical adenopathy.  Skin:    General: Skin is warm and dry.     Capillary Refill: Capillary refill takes less than 2 seconds.     Findings: No rash.  Neurological:     Mental Status: She is alert and oriented to person, place, and time.  Psychiatric:        Mood and Affect: Mood normal.      UC Treatments / Results  Labs (all labs ordered are listed, but only abnormal results are displayed) Labs Reviewed - No data to display  EKG   Radiology No results found.  Procedures Procedures (including critical care time)  Medications Ordered in UC Medications - No data to display  Initial Impression / Assessment and Plan / UC Course  I have reviewed the triage vital signs and the nursing notes.  Pertinent labs & imaging results that were available during my care of the patient were reviewed by me and considered in my medical decision making (see chart for details).  Plan of Care: Right conjunctivitis and eye pain: Maxitrol ophthalmic drops, 2 drops every 4 hours to right eye for 5 to 7 days.  Rinse the eye with warm water.  Warm compresses are helpful.  Follow-up if symptoms do not improve, worsen or new symptoms occur.   I reviewed the plan  of care with the patient and/or the patient's guardian.  The patient and/or guardian had time to ask questions and acknowledged that the questions were answered.  I provided instruction on symptoms or reasons to return here or to go to an ER, if symptoms/condition did not improve, worsened or if new symptoms occurred.  Final Clinical Impressions(s) / UC Diagnoses   Final diagnoses:  Acute right eye pain  Acute conjunctivitis of right eye, unspecified acute conjunctivitis type     Discharge Instructions      Conjunctivitis and secondary right eye pain: Warm  compresses are helpful.  Rinse eye with warm water.  Maxitrol ophthalmic drops, 2 drops every 4 hours for 5 to 7 days.  Follow-up if symptoms do not improve, worsen or new symptoms occur.     ED Prescriptions     Medication Sig Dispense Auth. Provider   neomycin -polymyxin b-dexamethasone  (MAXITROL) 3.5-10000-0.1 SUSP Place 2 drops into the right eye every 4 (four) hours. 5 mL Ival Domino, FNP      PDMP not reviewed this encounter.   Ival Domino, FNP 12/04/23 [phone]

## 2023-12-07 ENCOUNTER — Encounter: Payer: Self-pay | Admitting: Cardiology

## 2023-12-08 ENCOUNTER — Encounter (HOSPITAL_COMMUNITY): Payer: Self-pay | Admitting: *Deleted

## 2023-12-08 ENCOUNTER — Telehealth (HOSPITAL_COMMUNITY): Payer: Self-pay | Admitting: *Deleted

## 2023-12-08 NOTE — Telephone Encounter (Signed)
 Letter sent with instructions for upcoming stress test via [employer]. Argentina Bees, RN

## 2023-12-17 ENCOUNTER — Ambulatory Visit (HOSPITAL_COMMUNITY)
Admission: RE | Admit: 2023-12-17 | Discharge: 2023-12-17 | Disposition: A | Discharge status: Home / Self Care | Payer: Self-pay | Source: Ambulatory Visit | Attending: Cardiology | Admitting: Cardiology

## 2023-12-17 DIAGNOSIS — R079 Chest pain, unspecified: Secondary | ICD-10-CM | POA: Insufficient documentation

## 2023-12-17 DIAGNOSIS — R000 Tachycardia, unspecified: Secondary | ICD-10-CM | POA: Insufficient documentation

## 2023-12-17 MED ORDER — PERFLUTREN LIPID MICROSPHERE
5.0000 mL | INTRAVENOUS | Status: AC | PRN
  Administered 2023-12-17: 5 mL via INTRAVENOUS

## 2024-05-31 ENCOUNTER — Ambulatory Visit: Payer: Self-pay | Admitting: Internal Medicine

## 2024-05-31 ENCOUNTER — Ambulatory Visit (HOSPITAL_BASED_OUTPATIENT_CLINIC_OR_DEPARTMENT_OTHER): Admit: 2024-05-31 | Discharge: 2024-05-31 | Disposition: A | Payer: Self-pay | Admitting: Radiology

## 2024-05-31 ENCOUNTER — Encounter (HOSPITAL_BASED_OUTPATIENT_CLINIC_OR_DEPARTMENT_OTHER): Payer: Self-pay

## 2024-05-31 ENCOUNTER — Ambulatory Visit (HOSPITAL_BASED_OUTPATIENT_CLINIC_OR_DEPARTMENT_OTHER)
Admission: EM | Admit: 2024-05-31 | Discharge: 2024-05-31 | Disposition: A | Discharge status: Home / Self Care | Payer: Self-pay

## 2024-05-31 DIAGNOSIS — J029 Acute pharyngitis, unspecified: Secondary | ICD-10-CM

## 2024-05-31 DIAGNOSIS — F419 Anxiety disorder, unspecified: Secondary | ICD-10-CM

## 2024-05-31 DIAGNOSIS — R0602 Shortness of breath: Secondary | ICD-10-CM

## 2024-05-31 LAB — POCT INFLUENZA A/B
Influenza A, POC: NEGATIVE
Influenza B, POC: NEGATIVE

## 2024-05-31 LAB — POC SOFIA SARS ANTIGEN FIA: SARS Coronavirus 2 Ag: NEGATIVE

## 2024-05-31 NOTE — Discharge Instructions (Addendum)
 COVID and flu testing were negative.  Your chest x-ray was normal.  This is most likely something viral.  You can continue with over-the-counter medications for your symptoms as needed.  Flu and cold medications, Tylenol  and ibuprofen  for pain. Follow-up as needed

## 2024-05-31 NOTE — ED Triage Notes (Signed)
 Seen on 1/17 for headache at [facility] ER. States woke up today with left ear pain, left side throat pain.  I feel like I'm air hungry. Wonders if headache on 1/17 was the early signs of something else happening. Has not taken any medication today. States yesterday used her migraine med, klonipin, ibuprofen . Throat does not appear red or inflamed. States her headache is what hurts the most today.

## 2024-05-31 NOTE — ED Provider Notes (Signed)
 " [facility]    CSN: [id number] Arrival date & time: 05/31/24  0819      History   Chief Complaint Chief Complaint  Patient presents with   Sore Throat    HPI INDICA [name] is a 34 y.o. female.   Pt is a 34 year old female that presents with URI symptoms. Seen on 1/17 for headache at [facility] ER. States woke up today with left ear pain, left side throat pain.  Mild SOB. Has not taken any medication today. States yesterday used her migraine med, klonipin, ibuprofen .  States her headache is what hurts the most today. Took Maxalt without relief. No fever. Son also sick.     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   Major depressive disorder, single episode, severe without psychosis (HCC) 06/15/2016   PONV (postoperative nausea and vomiting)    Tachycardia 08/20/2023   Urinary frequency 03/12/2023    Patient Active Problem List   Diagnosis Date Noted   Chest pain of uncertain etiology 12/02/2023   Anxiety    Costochondritis    Depression    Endometriosis    History of kidney stones    PONV (postoperative nausea and vomiting)    Tachycardia 08/20/2023   Urinary frequency 03/12/2023   Dysuria 09/08/2022   Bipolar 1 disorder (HCC) 11/22/2018   Bipolar depression (HCC) 06/26/2016   Bipolar I disorder, most recent episode depressed (HCC)    Major depressive disorder, 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   Major depressive disorder, single episode, severe without psychosis (HCC) 06/15/2016   Normal labor and delivery 11/23/2010    Past Surgical History:  Procedure Laterality Date   APPENDECTOMY      CHOLECYSTECTOMY     LAPAROSCOPY N/A 04/16/2015   Procedure: LAPAROSCOPY DIAGNOSTIC;  Surgeon: Oneil FORBES Piety, MD;  Location: [facility];  Service: Gynecology;  Laterality: N/A;    OB History     Gravida  3   Para  2   Term  2   Preterm  0   AB  0   Living  2      SAB  0   IAB  0   Ectopic  0   Multiple      Live Births  2            Home Medications    Prior to Admission medications  Medication Sig Start Date End Date Taking? Authorizing Provider  rizatriptan (MAXALT) 10 MG tablet Take 10 mg by mouth as needed for migraine. 05/28/24  Yes [provider]  clonazePAM  (KLONOPIN ) 1 MG tablet Take 1 tablet (1 mg total) by mouth 2 (two) times daily. 12/01/23   Amin, Saad, MD  escitalopram  (LEXAPRO ) 10 MG tablet Take 1 tablet (10 mg total) by mouth daily. 12/01/23   Amin, Saad, MD  ivabradine  (CORLANOR) 5 MG TABS tablet Take 1 tablet (5 mg total) by mouth 2 (two) times daily with a meal. 12/02/23   Revankar, Jennifer SAUNDERS, MD  lamoTRIgine  (LAMICTAL ) 150 MG tablet Take 1 tablet (150 mg total) by mouth daily. 12/01/23  Amin, Saad, MD  neomycin -polymyxin b-dexamethasone  (MAXITROL) 3.5-10000-0.1 SUSP Place 2 drops into the right eye every 4 (four) hours. 12/04/23   Ival Domino, FNP  ondansetron  (ZOFRAN -ODT) 4 MG disintegrating tablet Take 1 tablet by mouth 3 (three) times daily. Patient not taking: Reported on 12/02/2023    [provider]  propranolol  (INDERAL ) 40 MG tablet Take 1 tablet (40 mg total) by mouth 2 (two) times daily. Patient not taking: Reported on 12/02/2023 09/20/23 09/19/24  Caleen Dirks, MD    Family History Family History  Problem Relation Age of Onset   Alcohol abuse Father    Depression Father    Mental illness Father    Drug abuse Father    Depression Mother    Birth defects Sister    Depression Maternal Grandmother    Heart disease Maternal Grandmother    Hypertension Maternal Grandmother    Depression Maternal Grandfather    Heart  disease Maternal Grandfather    Hypertension Maternal Grandfather    Depression Paternal Grandmother    Heart disease Paternal Grandmother    Hypertension Paternal Grandmother    Depression Paternal Grandfather    Heart disease Paternal Grandfather    Cancer Paternal Grandfather    Alcohol abuse Paternal Grandfather    Hypertension Paternal Grandfather     Social History Social History[1]   Allergies   Phenergan  [promethazine  hcl], Tilactase, and Promethazine    Review of Systems Review of Systems See HPI  Physical Exam Triage Vital Signs ED Triage Vitals  Encounter Vitals Group     BP 05/31/24 0847 107/72     Girls Systolic BP Percentile --      Girls Diastolic BP Percentile --      Boys Systolic BP Percentile --      Boys Diastolic BP Percentile --      Pulse Rate 05/31/24 0847 100     Resp 05/31/24 0847 20     Temp 05/31/24 0847 98.3 F (36.8 C)     Temp Source 05/31/24 0847 Oral     SpO2 05/31/24 0847 98 %     Weight --      Height --      Head Circumference --      Peak Flow --      Pain Score 05/31/24 0851 7     Pain Loc --      Pain Education --      Exclude from Growth Chart --    No data found.  Updated Vital Signs BP 107/72 (BP Location: Left Arm)   Pulse 100   Temp 98.3 F (36.8 C) (Oral)   Resp 20   LMP 05/13/2024   SpO2 98%   Visual Acuity Right Eye Distance:   Left Eye Distance:   Bilateral Distance:    Right Eye Near:   Left Eye Near:    Bilateral Near:     Physical Exam Vitals and nursing note reviewed.  Constitutional:      General: She is not in acute distress.    Appearance: Normal appearance. She is not ill-appearing, toxic-appearing or diaphoretic.  HENT:     Head: Normocephalic and atraumatic.     Right Ear: Tympanic membrane and ear canal normal.     Left Ear: Tympanic membrane and ear canal normal.     Mouth/Throat:     Pharynx: Oropharynx is clear.  Eyes:     Conjunctiva/sclera: Conjunctivae normal.   Cardiovascular:     Rate and Rhythm: Normal rate and regular  rhythm.     Pulses: Normal pulses.     Heart sounds: Normal heart sounds.  Pulmonary:     Effort: Pulmonary effort is normal.     Breath sounds: Normal breath sounds.  Skin:    General: Skin is warm and dry.  Neurological:     Mental Status: She is alert.  Psychiatric:        Mood and Affect: Mood normal.      UC Treatments / Results  Labs (all labs ordered are listed, but only abnormal results are displayed) Labs Reviewed  POCT INFLUENZA A/B - Normal  POC SOFIA SARS ANTIGEN FIA - Normal    EKG   Radiology DG Chest 2 View Result Date: 05/31/2024 CLINICAL DATA:  Shortness of breath. EXAM: CHEST - 2 VIEW COMPARISON:  Chest radiograph dated 06/26/2016. FINDINGS: The heart size and mediastinal contours are within normal limits. Both lungs are clear. The visualized skeletal structures are unremarkable. IMPRESSION: No active cardiopulmonary disease. Electronically Signed   By: Vanetta Chou M.D.   On: 05/31/2024 09:34    Procedures Procedures (including critical care time)  Medications Ordered in UC Medications - No data to display  Initial Impression / Assessment and Plan / UC Course  I have reviewed the triage vital signs and the nursing notes.  Pertinent labs & imaging results that were available during my care of the patient were reviewed by me and considered in my medical decision making (see chart for details).     Sore throat and anxiety- COVID and flu testing were negative.  Chest x-ray was normal.  This is most likely something viral.  She can continue with over-the-counter medications for your symptoms as needed.  Flu and cold medications, Tylenol  and ibuprofen  for pain. Follow-up as needed Final Clinical Impressions(s) / UC Diagnoses   Final diagnoses:  Anxiety  Sore throat     Discharge Instructions      COVID and flu testing were negative.  Your chest x-ray was normal.  This is most  likely something viral.  You can continue with over-the-counter medications for your symptoms as needed.  Flu and cold medications, Tylenol  and ibuprofen  for pain. Follow-up as needed     ED Prescriptions   None    PDMP not reviewed this encounter.     [1]  Social History Tobacco Use   Smoking status: Never   Smokeless tobacco: Never  Vaping Use   Vaping status: Former   Substances: THC, CBD  Substance Use Topics   Alcohol use: Never   Drug use: Never     Adah Wilbert LABOR, FNP 05/31/24 0957  "

## 2024-06-14 ENCOUNTER — Ambulatory Visit: Payer: Self-pay | Admitting: Internal Medicine

## 2024-06-14 ENCOUNTER — Encounter: Payer: Self-pay | Admitting: Internal Medicine

## 2024-06-14 VITALS — BP 120/70 | HR 92 | Temp 97.8°F | Resp 18 | Wt 144.0 lb

## 2024-06-14 DIAGNOSIS — R8290 Unspecified abnormal findings in urine: Secondary | ICD-10-CM

## 2024-06-14 DIAGNOSIS — F322 Major depressive disorder, single episode, severe without psychotic features: Secondary | ICD-10-CM

## 2024-06-14 DIAGNOSIS — N3001 Acute cystitis with hematuria: Secondary | ICD-10-CM | POA: Insufficient documentation

## 2024-06-14 DIAGNOSIS — F319 Bipolar disorder, unspecified: Secondary | ICD-10-CM

## 2024-06-14 LAB — POCT URINALYSIS DIPSTICK
Bilirubin, UA: NEGATIVE
Glucose, UA: NEGATIVE
Nitrite, UA: POSITIVE
Protein, UA: POSITIVE — AB
Spec Grav, UA: 1.015
Urobilinogen, UA: 1 U/dL
pH, UA: 7

## 2024-06-14 MED ORDER — NITROFURANTOIN MACROCRYSTAL 100 MG PO CAPS: 100.0000 mg | ORAL_CAPSULE | Freq: Four times a day (QID) | ORAL | 0 refills | Status: AC

## 2024-06-14 MED ORDER — LAMOTRIGINE 150 MG PO TABS: 150.0000 mg | ORAL_TABLET | Freq: Every day | ORAL | 2 refills | Status: AC

## 2024-06-14 MED ORDER — CLONAZEPAM 1 MG PO TABS: 1.0000 mg | ORAL_TABLET | Freq: Two times a day (BID) | ORAL | 3 refills | Status: AC

## 2024-06-14 MED ORDER — RIZATRIPTAN BENZOATE 10 MG PO TABS: 10.0000 mg | ORAL_TABLET | ORAL | 6 refills | Status: AC | PRN

## 2024-06-14 MED ORDER — TOPIRAMATE 50 MG PO TABS: 50.0000 mg | ORAL_TABLET | Freq: Two times a day (BID) | ORAL | 4 refills | Status: AC

## 2024-06-14 NOTE — Addendum Note (Signed)
 Addended by: Hillman Attig on: 06/14/2024 04:44 PM   Modules accepted: Orders

## 2024-06-14 NOTE — Addendum Note (Signed)
 Addended by: Romon Devereux on: 06/14/2024 04:45 PM   Modules accepted: Orders

## 2024-06-14 NOTE — Progress Notes (Signed)
" ° °  Acute Office Visit  Subjective:     Patient ID: Charitie L Crosson, female    DOB: 1991/04/07, 34 y.o.   MRN: [id number]  Chief Complaint  Patient presents with   office visit     Pt here for uti     HPI Patient is in today for pressure on urination and strong smell. No pressure on flank. She feel pressure on her bladder.   ROS      Objective:    BP 120/70   Pulse 92   Temp 97.8 F (36.6 C)   Resp 18   Wt 144 lb (65.3 kg)   LMP 05/13/2024   SpO2 98%   BMI 25.51 kg/m    Physical Exam  Results for orders placed or performed in visit on 06/14/24  POCT urinalysis dipstick  Result Value Ref Range   Color, UA dark yellow    Clarity, UA clear    Glucose, UA Negative Negative   Bilirubin, UA neg    Ketones, UA trace    Spec Grav, UA 1.015 1.010 - 1.025   Blood, UA trace    pH, UA 7.0 5.0 - 8.0   Protein, UA Positive (A) Negative   Urobilinogen, UA 1.0 0.2 or 1.0 E.U./dL   Nitrite, UA pos    Leukocytes, UA Trace (A) Negative   Appearance clear    Odor yes         Assessment & Plan:   Problem List Items Addressed This Visit   None Visit Diagnoses       Urine abnormality    -  Primary   Relevant Orders   POCT urinalysis dipstick (Completed)       No orders of the defined types were placed in this encounter.   Return in about 3 months (around 09/11/2024).  Roetta Dare, MD   "

## 2024-06-15 NOTE — Assessment & Plan Note (Signed)
"    I will send nitrofurantoin  100 mg every 6 hour for 7 days.  She will call if she is not better. "

## 2024-06-15 NOTE — Assessment & Plan Note (Signed)
"    She has bipolar disorder with depression and she has stopped taking escitalopram  because that was making her feel bad.  I will send refill of Lamictal  and she will follow-up with [facility]. "

## 2024-09-11 ENCOUNTER — Ambulatory Visit: Payer: Self-pay | Admitting: Internal Medicine
